# Patient Record
Sex: Female | Born: 1960
Health system: Southern US, Community
[De-identification: ages and names within clinical notes are randomized; demographics above are authoritative.]

## PROBLEM LIST (undated history)

## (undated) ENCOUNTER — Ambulatory Visit: Admission: EM | Payer: Self-pay | Source: Home / Self Care

## (undated) DIAGNOSIS — K5900 Constipation, unspecified: Secondary | ICD-10-CM

## (undated) DIAGNOSIS — E663 Overweight: Secondary | ICD-10-CM

## (undated) DIAGNOSIS — S83249A Other tear of medial meniscus, current injury, unspecified knee, initial encounter: Secondary | ICD-10-CM

## (undated) DIAGNOSIS — Z889 Allergy status to unspecified drugs, medicaments and biological substances status: Secondary | ICD-10-CM

## (undated) DIAGNOSIS — J45909 Unspecified asthma, uncomplicated: Secondary | ICD-10-CM

## (undated) DIAGNOSIS — R609 Edema, unspecified: Secondary | ICD-10-CM

## (undated) DIAGNOSIS — K635 Polyp of colon: Secondary | ICD-10-CM

## (undated) DIAGNOSIS — Q0701 Arnold-Chiari syndrome with spina bifida: Secondary | ICD-10-CM

## (undated) DIAGNOSIS — K5190 Ulcerative colitis, unspecified, without complications: Secondary | ICD-10-CM

## (undated) HISTORY — DX: Edema, unspecified: R60.9

## (undated) HISTORY — DX: Arnold-Chiari syndrome with spina bifida: Q07.01

## (undated) HISTORY — DX: Ulcerative colitis, unspecified, without complications: K51.90

## (undated) HISTORY — DX: Allergy status to unspecified drugs, medicaments and biological substances: Z88.9

## (undated) HISTORY — DX: Polyp of colon: K63.5

## (undated) HISTORY — DX: Overweight: E66.3

## (undated) HISTORY — DX: Constipation, unspecified: K59.00

## (undated) HISTORY — DX: Other tear of medial meniscus, current injury, unspecified knee, initial encounter: S83.249A

---

## 1997-07-10 HISTORY — PX: ABDOMINAL HYSTERECTOMY: SHX81

## 1998-02-17 ENCOUNTER — Other Ambulatory Visit: Admission: RE | Admit: 1998-02-17 | Discharge: 1998-02-17 | Payer: Self-pay | Admitting: Obstetrics and Gynecology

## 1998-04-14 ENCOUNTER — Inpatient Hospital Stay (HOSPITAL_COMMUNITY): Admission: RE | Admit: 1998-04-14 | Discharge: 1998-04-16 | Payer: Self-pay | Admitting: Obstetrics and Gynecology

## 1998-04-18 ENCOUNTER — Inpatient Hospital Stay (HOSPITAL_COMMUNITY): Admission: AD | Admit: 1998-04-18 | Discharge: 1998-04-18 | Payer: Self-pay | Admitting: Obstetrics & Gynecology

## 1999-02-21 ENCOUNTER — Other Ambulatory Visit: Admission: RE | Admit: 1999-02-21 | Discharge: 1999-02-21 | Payer: Self-pay | Admitting: Obstetrics and Gynecology

## 1999-08-31 ENCOUNTER — Emergency Department (HOSPITAL_COMMUNITY): Admission: EM | Admit: 1999-08-31 | Discharge: 1999-08-31 | Payer: Self-pay | Admitting: Emergency Medicine

## 1999-09-01 ENCOUNTER — Emergency Department (HOSPITAL_COMMUNITY): Admission: EM | Admit: 1999-09-01 | Discharge: 1999-09-01 | Payer: Self-pay

## 1999-09-09 ENCOUNTER — Emergency Department (HOSPITAL_COMMUNITY): Admission: EM | Admit: 1999-09-09 | Discharge: 1999-09-09 | Payer: Self-pay | Admitting: Emergency Medicine

## 2000-08-26 ENCOUNTER — Encounter: Payer: Self-pay | Admitting: Family Medicine

## 2000-08-26 ENCOUNTER — Ambulatory Visit (HOSPITAL_COMMUNITY): Admission: RE | Admit: 2000-08-26 | Discharge: 2000-08-26 | Payer: Self-pay | Admitting: Family Medicine

## 2001-03-22 ENCOUNTER — Other Ambulatory Visit: Admission: RE | Admit: 2001-03-22 | Discharge: 2001-03-22 | Payer: Self-pay | Admitting: Obstetrics and Gynecology

## 2002-05-02 ENCOUNTER — Other Ambulatory Visit: Admission: RE | Admit: 2002-05-02 | Discharge: 2002-05-02 | Payer: Self-pay | Admitting: Obstetrics and Gynecology

## 2002-07-10 HISTORY — PX: HERNIA REPAIR: SHX51

## 2003-03-25 ENCOUNTER — Emergency Department (HOSPITAL_COMMUNITY): Admission: EM | Admit: 2003-03-25 | Discharge: 2003-03-26 | Payer: Self-pay | Admitting: Emergency Medicine

## 2003-03-26 ENCOUNTER — Encounter: Payer: Self-pay | Admitting: Emergency Medicine

## 2003-05-05 ENCOUNTER — Other Ambulatory Visit: Admission: RE | Admit: 2003-05-05 | Discharge: 2003-05-05 | Payer: Self-pay | Admitting: Obstetrics and Gynecology

## 2004-01-26 ENCOUNTER — Ambulatory Visit (HOSPITAL_COMMUNITY): Admission: RE | Admit: 2004-01-26 | Discharge: 2004-01-26 | Payer: Self-pay | Admitting: Family Medicine

## 2004-01-29 ENCOUNTER — Encounter: Admission: RE | Admit: 2004-01-29 | Discharge: 2004-01-29 | Payer: Self-pay | Admitting: Family Medicine

## 2004-07-10 DIAGNOSIS — K635 Polyp of colon: Secondary | ICD-10-CM

## 2004-07-10 HISTORY — DX: Polyp of colon: K63.5

## 2004-10-07 ENCOUNTER — Other Ambulatory Visit: Admission: RE | Admit: 2004-10-07 | Discharge: 2004-10-07 | Payer: Self-pay | Admitting: Obstetrics and Gynecology

## 2004-10-11 ENCOUNTER — Encounter: Payer: Self-pay | Admitting: Family Medicine

## 2004-11-09 ENCOUNTER — Encounter: Admission: RE | Admit: 2004-11-09 | Discharge: 2004-11-09 | Payer: Self-pay | Admitting: Family Medicine

## 2005-05-18 ENCOUNTER — Encounter (INDEPENDENT_AMBULATORY_CARE_PROVIDER_SITE_OTHER): Payer: Self-pay | Admitting: Specialist

## 2005-05-18 ENCOUNTER — Encounter: Payer: Self-pay | Admitting: Family Medicine

## 2005-05-18 ENCOUNTER — Ambulatory Visit (HOSPITAL_COMMUNITY): Admission: RE | Admit: 2005-05-18 | Discharge: 2005-05-18 | Payer: Self-pay | Admitting: Gastroenterology

## 2005-10-13 ENCOUNTER — Encounter: Admission: RE | Admit: 2005-10-13 | Discharge: 2005-10-13 | Payer: Self-pay | Admitting: Obstetrics and Gynecology

## 2006-05-11 ENCOUNTER — Ambulatory Visit: Payer: Self-pay | Admitting: Family Medicine

## 2006-05-11 LAB — CONVERTED CEMR LAB
BUN: 10 mg/dL (ref 6–23)
Calcium: 9.3 mg/dL (ref 8.4–10.5)
Chloride: 105 meq/L (ref 96–112)
Creatinine, Ser: 0.8 mg/dL (ref 0.4–1.2)

## 2007-08-29 ENCOUNTER — Telehealth (INDEPENDENT_AMBULATORY_CARE_PROVIDER_SITE_OTHER): Payer: Self-pay | Admitting: *Deleted

## 2007-09-06 ENCOUNTER — Telehealth (INDEPENDENT_AMBULATORY_CARE_PROVIDER_SITE_OTHER): Payer: Self-pay | Admitting: *Deleted

## 2007-09-06 ENCOUNTER — Ambulatory Visit: Payer: Self-pay | Admitting: Family Medicine

## 2007-09-06 DIAGNOSIS — J019 Acute sinusitis, unspecified: Secondary | ICD-10-CM | POA: Insufficient documentation

## 2007-09-06 DIAGNOSIS — I10 Essential (primary) hypertension: Secondary | ICD-10-CM | POA: Insufficient documentation

## 2007-09-06 LAB — CONVERTED CEMR LAB
CO2: 34 meq/L — ABNORMAL HIGH (ref 19–32)
Chloride: 103 meq/L (ref 96–112)
Creatinine, Ser: 0.7 mg/dL (ref 0.4–1.2)
GFR calc Af Amer: 115 mL/min
Sodium: 142 meq/L (ref 135–145)

## 2007-09-13 ENCOUNTER — Encounter (INDEPENDENT_AMBULATORY_CARE_PROVIDER_SITE_OTHER): Payer: Self-pay | Admitting: *Deleted

## 2007-09-13 ENCOUNTER — Ambulatory Visit: Payer: Self-pay | Admitting: Family Medicine

## 2007-09-13 LAB — CONVERTED CEMR LAB
Triglycerides: 103 mg/dL (ref 0–149)
VLDL: 21 mg/dL (ref 0–40)

## 2007-09-16 ENCOUNTER — Telehealth (INDEPENDENT_AMBULATORY_CARE_PROVIDER_SITE_OTHER): Payer: Self-pay | Admitting: Family Medicine

## 2007-09-18 ENCOUNTER — Ambulatory Visit: Payer: Self-pay | Admitting: Family Medicine

## 2007-09-20 ENCOUNTER — Telehealth (INDEPENDENT_AMBULATORY_CARE_PROVIDER_SITE_OTHER): Payer: Self-pay | Admitting: *Deleted

## 2007-12-25 ENCOUNTER — Telehealth (INDEPENDENT_AMBULATORY_CARE_PROVIDER_SITE_OTHER): Payer: Self-pay | Admitting: *Deleted

## 2008-02-19 ENCOUNTER — Telehealth (INDEPENDENT_AMBULATORY_CARE_PROVIDER_SITE_OTHER): Payer: Self-pay | Admitting: *Deleted

## 2008-02-21 ENCOUNTER — Ambulatory Visit: Payer: Self-pay | Admitting: Family Medicine

## 2008-02-21 DIAGNOSIS — E876 Hypokalemia: Secondary | ICD-10-CM

## 2008-02-27 ENCOUNTER — Telehealth (INDEPENDENT_AMBULATORY_CARE_PROVIDER_SITE_OTHER): Payer: Self-pay | Admitting: *Deleted

## 2008-02-27 LAB — CONVERTED CEMR LAB
BUN: 19 mg/dL (ref 6–23)
CO2: 33 meq/L — ABNORMAL HIGH (ref 19–32)
Calcium: 9.2 mg/dL (ref 8.4–10.5)
Chloride: 101 meq/L (ref 96–112)
GFR calc Af Amer: 86 mL/min
Glucose, Bld: 72 mg/dL (ref 70–99)
Potassium: 3 meq/L — ABNORMAL LOW (ref 3.5–5.1)

## 2008-03-25 LAB — CONVERTED CEMR LAB: Pap Smear: NORMAL

## 2008-04-09 ENCOUNTER — Ambulatory Visit: Payer: Self-pay | Admitting: Family Medicine

## 2008-04-13 ENCOUNTER — Telehealth (INDEPENDENT_AMBULATORY_CARE_PROVIDER_SITE_OTHER): Payer: Self-pay | Admitting: *Deleted

## 2008-04-13 LAB — CONVERTED CEMR LAB
BUN: 12 mg/dL (ref 6–23)
CO2: 32 meq/L (ref 19–32)
Calcium: 9.1 mg/dL (ref 8.4–10.5)
Creatinine, Ser: 0.7 mg/dL (ref 0.4–1.2)
GFR calc Af Amer: 114 mL/min

## 2008-10-28 ENCOUNTER — Emergency Department (HOSPITAL_BASED_OUTPATIENT_CLINIC_OR_DEPARTMENT_OTHER): Admission: EM | Admit: 2008-10-28 | Discharge: 2008-10-28 | Payer: Self-pay | Admitting: Emergency Medicine

## 2008-11-26 ENCOUNTER — Ambulatory Visit: Payer: Self-pay | Admitting: Family Medicine

## 2008-11-26 LAB — CONVERTED CEMR LAB
Chloride: 101 meq/L (ref 96–112)
GFR calc non Af Amer: 80.73 mL/min (ref >60)
Glucose, Bld: 80 mg/dL (ref 70–99)
Sodium: 139 meq/L (ref 135–145)

## 2008-11-27 ENCOUNTER — Encounter (INDEPENDENT_AMBULATORY_CARE_PROVIDER_SITE_OTHER): Payer: Self-pay | Admitting: *Deleted

## 2009-06-14 ENCOUNTER — Ambulatory Visit: Payer: Self-pay | Admitting: Family Medicine

## 2009-06-14 DIAGNOSIS — D239 Other benign neoplasm of skin, unspecified: Secondary | ICD-10-CM | POA: Insufficient documentation

## 2009-06-14 DIAGNOSIS — J45909 Unspecified asthma, uncomplicated: Secondary | ICD-10-CM | POA: Insufficient documentation

## 2009-06-14 DIAGNOSIS — M25569 Pain in unspecified knee: Secondary | ICD-10-CM | POA: Insufficient documentation

## 2009-06-15 ENCOUNTER — Telehealth (INDEPENDENT_AMBULATORY_CARE_PROVIDER_SITE_OTHER): Payer: Self-pay | Admitting: *Deleted

## 2009-06-15 LAB — CONVERTED CEMR LAB
ALT: 37 units/L — ABNORMAL HIGH (ref 0–35)
Albumin: 3.8 g/dL (ref 3.5–5.2)
BUN: 14 mg/dL (ref 6–23)
Basophils Relative: 1.6 % (ref 0.0–3.0)
Bilirubin, Direct: 0.1 mg/dL (ref 0.0–0.3)
CO2: 32 meq/L (ref 19–32)
Calcium: 9.3 mg/dL (ref 8.4–10.5)
Chloride: 101 meq/L (ref 96–112)
Cholesterol: 152 mg/dL (ref 0–200)
Creatinine, Ser: 0.7 mg/dL (ref 0.4–1.2)
Eosinophils Relative: 3.9 % (ref 0.0–5.0)
Glucose, Bld: 102 mg/dL — ABNORMAL HIGH (ref 70–99)
HCT: 40.6 % (ref 36.0–46.0)
LDL Cholesterol: 89 mg/dL (ref 0–99)
Lymphocytes Relative: 28.9 % (ref 12.0–46.0)
MCHC: 34 g/dL (ref 30.0–36.0)
MCV: 94.7 fL (ref 78.0–100.0)
Monocytes Absolute: 0.5 10*3/uL (ref 0.1–1.0)
Neutro Abs: 4 10*3/uL (ref 1.4–7.7)
Neutrophils Relative %: 58.1 % (ref 43.0–77.0)
RBC: 4.29 M/uL (ref 3.87–5.11)
Total Bilirubin: 1.3 mg/dL — ABNORMAL HIGH (ref 0.3–1.2)
Total CHOL/HDL Ratio: 4
WBC: 6.9 10*3/uL (ref 4.5–10.5)

## 2010-01-12 ENCOUNTER — Encounter: Payer: Self-pay | Admitting: Family Medicine

## 2010-03-28 ENCOUNTER — Ambulatory Visit: Payer: Self-pay | Admitting: Family Medicine

## 2010-03-28 ENCOUNTER — Telehealth: Payer: Self-pay | Admitting: Family Medicine

## 2010-03-28 DIAGNOSIS — Z780 Asymptomatic menopausal state: Secondary | ICD-10-CM | POA: Insufficient documentation

## 2010-03-28 LAB — CONVERTED CEMR LAB
Blood in Urine, dipstick: NEGATIVE
Nitrite: NEGATIVE
Protein, U semiquant: NEGATIVE
Specific Gravity, Urine: >=1.03
Urobilinogen, UA: NEGATIVE

## 2010-03-29 LAB — CONVERTED CEMR LAB
ALT: 37 units/L — ABNORMAL HIGH (ref 0–35)
Alkaline Phosphatase: 84 units/L (ref 39–117)
BUN: 16 mg/dL (ref 6–23)
Basophils Absolute: 0.1 10*3/uL (ref 0.0–0.1)
Bilirubin, Direct: 0.2 mg/dL (ref 0.0–0.3)
CO2: 30 meq/L (ref 19–32)
Calcium: 9.3 mg/dL (ref 8.4–10.5)
Cholesterol: 155 mg/dL (ref 0–200)
Creatinine, Ser: 0.7 mg/dL (ref 0.4–1.2)
Eosinophils Relative: 3.1 % (ref 0.0–5.0)
Glucose, Bld: 103 mg/dL — ABNORMAL HIGH (ref 70–99)
HCT: 40.2 % (ref 36.0–46.0)
Hemoglobin: 13.6 g/dL (ref 12.0–15.0)
Lymphs Abs: 2.3 10*3/uL (ref 0.7–4.0)
Monocytes Absolute: 0.5 10*3/uL (ref 0.1–1.0)
Neutro Abs: 4.5 10*3/uL (ref 1.4–7.7)
Platelets: 240 10*3/uL (ref 150.0–400.0)
Potassium: 3.8 meq/L (ref 3.5–5.1)
Sodium: 145 meq/L (ref 135–145)
Total Bilirubin: 0.7 mg/dL (ref 0.3–1.2)
Total CHOL/HDL Ratio: 4
VLDL: 18 mg/dL (ref 0.0–40.0)
Vit D, 25-Hydroxy: 32 ng/mL (ref 30–89)
WBC: 7.6 10*3/uL (ref 4.5–10.5)

## 2010-08-09 NOTE — Procedures (Signed)
Summary: Colonoscopy/[place]  Colonoscopy/[facility]   Imported By: Sherian Rein 04/01/2010 14:59:03  _____________________________________________________________________  External Attachment:    Type:   Image     Comment:   External Document

## 2010-08-09 NOTE — Assessment & Plan Note (Signed)
Summary: CPX/FASTING//KN   Vital Signs:  Patient profile:   50 year old female Height:      63 inches Weight:      266.0 pounds BMI:     47.29 Temp:     98.4 degrees F oral Pulse rate:   80 / minute Pulse rhythm:   regular BP sitting:   120 / 86  (right arm)  Vitals Entered By: Almeta Monas CMA Duncan Dull) (March 28, 2010 9:10 AM) CC: cpx/fasting Flu Vaccine Consent Questions     Do you have a history of severe allergic reactions to this vaccine? no    Any prior history of allergic reactions to egg and/or gelatin? no    Do you have a sensitivity to the preservative Thimersol? no    Do you have a past history of Guillan-Barre Syndrome? no    Do you currently have an acute febrile illness? no    Have you ever had a severe reaction to latex? no    Vaccine information given and explained to patient? yes    Are you currently pregnant? no    Lot Number:[id number]   Exp Date:01/07/2011   Site Given  Left Deltoid IM   History of Present Illness: Pt here for cpe and labs.  Dr Henderson Cloud is her gyn.    Asthma History    Asthma Control Assessment:    Age range: 12+ years    Symptoms: 0-2 days/week    Nighttime Awakenings: 0-2/month    Interferes w/ normal activity: no limitations    SABA use (not for EIB): 0-2 days/week    ATAQ questionnaire: 0    Asthma Control Assessment: Well Controlled   Preventive Screening-Counseling & Management  Alcohol-Tobacco     Alcohol drinks/day: <1     Alcohol type: wine     Smoking Status: never  Caffeine-Diet-Exercise     Caffeine use/day: 1     Does Patient Exercise: yes     Type of exercise: zumba , treadmill, weights     Exercise (avg: min/session): 30-60     Times/week: 3     Exercise Counseling: not indicated; exercise is adequate  Hep-HIV-STD-Contraception     Dental Visit-last 6 months yes     Dental Care Counseling: not indicated; dental care within six months     SBE monthly: yes     SBE Education/Counseling: not indicated;  SBE done regularly  Safety-Violence-Falls     Seat Belt Use: yes     Seat Belt Counseling: to use seat belts when in vehicle      Sexual History:  currently monogamous.        Drug Use:  no.    Current Medications (verified): 1)  Hydrochlorothiazide 25 Mg  Tabs (Hydrochlorothiazide) .... Take One Tablet Daily 2)  Ambien 10 Mg  Tabs (Zolpidem Tartrate) .[name] Kitchen.. 1 By Mouth Qhs Prn 3)  Klor-Con M20 20 Meq Cr-Tabs (Potassium Chloride Crys Cr) .[name] Kitchen.. 1 By Mouth Two Times A Day 4)  Ultram 50 Mg Tabs (Tramadol Hcl) .[name] Kitchen.. 1-2 By Mouth Q6h As Needed 5)  Nasacort Aq 55 Mcg/act Aers (Triamcinolone Acetonide(Nasal)) .... 2 Sprays Each Nostril Once Daily 6)  Symbicort 80-4.5 Mcg/act Aero (Budesonide-Formoterol Fumarate) .... 2 Puffs Two Times A Day  Allergies (verified): No Known Drug Allergies  Past History:  Past Medical History: Last updated: 09/06/2007 Hypertension  Family History: Last updated: 03/28/2010 Family History of CAD Female 1st degree relative 52yo Family History of Stroke F 1st degree relative 52yo Family History  Hypertension Family History High cholesterol Family History Osteoporosis Family History of Colon CA 1st degree relative 52yo Family History of Prostate CA 1st degree relative 52yo Family History of Asthma  Social History: Last updated: 03/28/2010 GYN: for preventative care Occupation:  self employed-- pest control Married Never Smoked Alcohol use-yes Drug use-no Regular exercise-yes  Risk Factors: Alcohol Use: <1 (03/28/2010) Caffeine Use: 1 (03/28/2010) Exercise: yes (03/28/2010)  Risk Factors: Smoking Status: never (03/28/2010)  Family History: Reviewed history and no changes required. Family History of CAD Female 1st degree relative 52yo Family History of Stroke F 1st degree relative 52yo Family History Hypertension Family History High cholesterol Family History Osteoporosis Family History of Colon CA 1st degree relative 52yo Family History of  Prostate CA 1st degree relative 52yo Family History of Asthma  Social History: Reviewed history from 09/06/2007 and no changes required. GYN: for preventative care Occupation:  self employed-- pest control Married Never Smoked Alcohol use-yes Drug use-no Regular exercise-yes Smoking Status:  never Caffeine use/day:  1 Does Patient Exercise:  yes Dental Care w/in 6 mos.:  yes Seat Belt Use:  yes Sexual History:  currently monogamous Occupation:  employed Drug Use:  no  Review of Systems      See HPI General:  Denies chills, fatigue, fever, loss of appetite, malaise, sleep disorder, sweats, weakness, and weight loss. Eyes:  Denies blurring, discharge, double vision, eye irritation, eye pain, halos, itching, light sensitivity, red eye, vision loss-1 eye, and vision loss-both eyes; optho-q2y. ENT:  Denies decreased hearing, difficulty swallowing, ear discharge, earache, hoarseness, nasal congestion, nosebleeds, postnasal drainage, ringing in ears, sinus pressure, and sore throat. CV:  Denies bluish discoloration of lips or nails, chest pain or discomfort, difficulty breathing at night, difficulty breathing while lying down, fainting, fatigue, leg cramps with exertion, lightheadness, near fainting, palpitations, shortness of breath with exertion, swelling of feet, swelling of hands, and weight gain. Resp:  Denies chest discomfort, chest pain with inspiration, cough, coughing up blood, excessive snoring, hypersomnolence, morning headaches, pleuritic, shortness of breath, sputum productive, and wheezing. GI:  Denies abdominal pain, bloody stools, change in bowel habits, constipation, dark tarry stools, diarrhea, excessive appetite, gas, hemorrhoids, indigestion, loss of appetite, nausea, vomiting, vomiting blood, and yellowish skin color. GU:  Denies abnormal vaginal bleeding, decreased libido, discharge, dysuria, genital sores, hematuria, incontinence, nocturia, urinary frequency, and urinary  hesitancy. MS:  Denies joint pain, joint redness, joint swelling, loss of strength, low back pain, mid back pain, muscle aches, muscle , cramps, muscle weakness, stiffness, and thoracic pain. Derm:  Denies changes in color of skin, changes in nail beds, dryness, excessive perspiration, flushing, hair loss, insect bite(s), itching, lesion(s), poor wound healing, and rash. Neuro:  Denies brief paralysis, difficulty with concentration, disturbances in coordination, falling down, headaches, inability to speak, memory loss, numbness, poor balance, seizures, sensation of room spinning, tingling, tremors, visual disturbances, and weakness. Psych:  Denies alternate hallucination ( auditory/visual), anxiety, depression, easily angered, easily tearful, irritability, mental problems, panic attacks, sense of great danger, suicidal thoughts/plans, thoughts of violence, unusual visions or sounds, and thoughts /plans of harming others. Endo:  Denies cold intolerance, excessive hunger, excessive thirst, excessive urination, heat intolerance, polyuria, and weight change. Heme:  Denies abnormal bruising, bleeding, enlarge lymph nodes, fevers, pallor, and skin discoloration. Allergy:  Denies hives or rash, itching eyes, persistent infections, seasonal allergies, and sneezing.  Physical Exam  General:  Well-developed,well-nourished,in no acute distress; alert,appropriate and cooperative throughout examination Head:  Normocephalic and atraumatic without obvious abnormalities. No  apparent alopecia or balding. Eyes:  pupils equal, pupils round, pupils reactive to light, and no injection.   Ears:  External ear exam shows no significant lesions or deformities.  Otoscopic examination reveals clear canals, tympanic membranes are intact bilaterally without bulging, retraction, inflammation or discharge. Hearing is grossly normal bilaterally. Nose:  External nasal examination shows no deformity or inflammation. Nasal mucosa are  pink and moist without lesions or exudates. Mouth:  Oral mucosa and oropharynx without lesions or exudates.  Teeth in good repair. Neck:  No deformities, masses, or tenderness noted.no carotid bruits.   Chest Wall:  No deformities, masses, or tenderness noted. Breasts:  gyn Lungs:  Normal respiratory effort, chest expands symmetrically. Lungs are clear to auscultation, no crackles or wheezes. Heart:  normal rate and no murmur.   Abdomen:  Bowel sounds positive,abdomen soft and non-tender without masses, organomegaly or hernias noted. Rectal:  gyn Genitalia:  gyn Msk:  normal ROM, no joint tenderness, no joint swelling, no joint warmth, no redness over joints, no joint deformities, no joint instability, and no crepitation.   Pulses:  R posterior tibial normal, R dorsalis pedis normal, R carotid normal, L posterior tibial normal, L dorsalis pedis normal, and L carotid normal.   Extremities:  No clubbing, cyanosis, edema, or deformity noted with normal full range of motion of all joints.   Neurologic:  No cranial nerve deficits noted. Station and gait are normal. Plantar reflexes are down-going bilaterally. DTRs are symmetrical throughout. Sensory, motor and coordinative functions appear intact. Skin:  Intact without suspicious lesions or rashes Cervical Nodes:  No lymphadenopathy noted Axillary Nodes:  No palpable lymphadenopathy Psych:  Cognition and judgment appear intact. Alert and cooperative with normal attention span and concentration. No apparent delusions, illusions, hallucinations   Impression & Recommendations:  Problem # 1:  PREVENTIVE HEALTH CARE (ICD-V70.0)  Orders: Venipuncture (13244) TLB-Lipid Panel (80061-LIPID) TLB-BMP (Basic Metabolic Panel-BMET) (80048-METABOL) TLB-CBC Platelet - w/Differential (85025-CBCD) TLB-Hepatic/Liver Function Pnl (80076-HEPATIC) TLB-TSH (Thyroid Stimulating Hormone) (84443-TSH) T-Vitamin D (25-Hydroxy) (01027-25366) Specimen Handling  (99000) EKG w/ Interpretation (93000)  Problem # 2:  POSTMENOPAUSAL STATUS (ICD-V49.81)  Orders: Venipuncture (44034) TLB-Lipid Panel (80061-LIPID) TLB-BMP (Basic Metabolic Panel-BMET) (80048-METABOL) TLB-CBC Platelet - w/Differential (85025-CBCD) TLB-Hepatic/Liver Function Pnl (80076-HEPATIC) TLB-TSH (Thyroid Stimulating Hormone) (84443-TSH) T-Vitamin D (25-Hydroxy) (74259-56387) Specimen Handling (56433) EKG w/ Interpretation (93000)  Problem # 3:  ASTHMA, CHILDHOOD (ICD-493.00)  Her updated medication list for this problem includes:    Symbicort 80-4.5 Mcg/act Aero (Budesonide-formoterol fumarate) .[name] Kitchen... 2 puffs two times a day  Problem # 4:  HYPOKALEMIA (ICD-276.8)  Orders: Venipuncture (29518) TLB-Lipid Panel (80061-LIPID) TLB-BMP (Basic Metabolic Panel-BMET) (80048-METABOL) TLB-CBC Platelet - w/Differential (85025-CBCD) TLB-Hepatic/Liver Function Pnl (80076-HEPATIC) TLB-TSH (Thyroid Stimulating Hormone) (84443-TSH) T-Vitamin D (25-Hydroxy) (84166-06301) Specimen Handling (60109) EKG w/ Interpretation (93000)  Problem # 5:  MORBID OBESITY (ICD-278.01)  Ht: 63 (03/28/2010)   Wt: 266.0 (03/28/2010)   BMI: 47.29 (03/28/2010)  Orders: EKG w/ Interpretation (93000)  Complete Medication List: 1)  Hydrochlorothiazide 25 Mg Tabs (Hydrochlorothiazide) .... Take one tablet daily 2)  Ambien 10 Mg Tabs (Zolpidem tartrate) .[name] Kitchen.. 1 by mouth qhs prn 3)  Klor-con M20 20 Meq Cr-tabs (Potassium chloride crys cr) .[name] Kitchen.. 1 by mouth two times a day 4)  Ultram 50 Mg Tabs (Tramadol hcl) .[name] Kitchen.. 1-2 by mouth q6h as needed 5)  Nasacort Aq 55 Mcg/act Aers (Triamcinolone acetonide(nasal)) .... 2 sprays each nostril once daily 6)  Symbicort 80-4.5 Mcg/act Aero (Budesonide-formoterol fumarate) .... 2 puffs two times a day  Other Orders: Admin 1st Vaccine (16109) Flu Vaccine 60yrs + [phone]) Tdap => 26yrs IM (09811) Admin of Any Addtl Vaccine (91478)   EKG  Procedure date:   03/28/2010  Findings:      Normal sinus rhythm with rate of:  76 bpm   Colonoscopy Result Date:  03/22/2005 Colonoscopy Result:  polyp Colonoscopy Next Due:  Not Indicated PAP Result Date:  03/17/2009 PAP Result:  normal PAP Next Due:  2 yr Mammogram Result Date:  01/19/2010 Mammogram Result:  normal Mammogram Next Due:  1 yr   Immunizations Administered:  Tetanus Vaccine:    Vaccine Type: Tdap    Site: right deltoid    Mfr: Merck    Dose: 0.5 ml    Route: IM    Given by: Almeta Monas CMA (AAMA)    Exp. Date: 04/28/2012    Lot #: [id number]    VIS given: 05/27/08 version given March 28, 2010.   Laboratory Results   Urine Tests   Date/Time Reported: March 28, 2010 10:47 AM   Routine Urinalysis   Color: yellow Appearance: Clear Glucose: negative   (Normal Range: Negative) Bilirubin: negative   (Normal Range: Negative) Ketone: negative   (Normal Range: Negative) Spec. Gravity: >=1.030   (Normal Range: 1.003-1.035) Blood: negative   (Normal Range: Negative) pH: 5.0   (Normal Range: 5.0-8.0) Protein: negative   (Normal Range: Negative) Urobilinogen: negative   (Normal Range: 0-1) Nitrite: negative   (Normal Range: Negative) Leukocyte Esterace: negative   (Normal Range: Negative)    Comments: Floydene Flock  March 28, 2010 10:48 AM

## 2010-08-09 NOTE — Progress Notes (Signed)
Summary: RX/colon test info  Phone Note Outgoing Call   Summary of Call: called pt tpo advise her Colonoscopy not due until 2016. Pt voiced understanding, Adv Rx will be waiting upfront.  Initial call taken by: Almeta Monas CMA Duncan Dull),  March 28, 2010 1:57 PM  Follow-up for Phone Call        adipex 37.5  1 by mouth once daily ----needs f/u ov 1 month Follow-up by: Loreen Freud DO,  March 28, 2010 2:14 PM    New/Updated Medications: ADIPEX-P 37.5 MG TABS (PHENTERMINE HCL) 1 by mouth once daily Prescriptions: ADIPEX-P 37.5 MG TABS (PHENTERMINE HCL) 1 by mouth once daily  #30 x 0   Entered and Authorized by:   Loreen Freud DO   Signed by:   Loreen Freud DO on 03/28/2010   Method used:   Print then Give to Patient   RxID:   [id number]

## 2010-10-19 LAB — RAPID STREP SCREEN (MED CTR MEBANE ONLY): Streptococcus, Group A Screen (Direct): NEGATIVE

## 2010-11-25 NOTE — Op Note (Signed)
NAME:  Audrey Duncan, Audrey Duncan           ACCOUNT NO.:  [id number]   MEDICAL RECORD NO.:  [id number]          PATIENT TYPE:  AMB   LOCATION:  ENDO                         FACILITY:  [facility]   PHYSICIAN:  John C. Madilyn Fireman, M.D.    DATE OF BIRTH:  April 13, 1961   DATE OF PROCEDURE:  05/18/2005  DATE OF DISCHARGE:                                 OPERATIVE REPORT   INDICATIONS FOR PROCEDURE:  Occasional rectal bleeding with previous  abdominal pain, alternating constipation and diarrhea in a 50 year old  patient with no prior colon screening.   PROCEDURE:  The patient was placed in the left lateral decubitus position  and placed on the pulse monitor with continuous low-flow oxygen delivered by  nasal cannula. She was sedated with to 60 mcg IV fentanyl and 6 milligrams  IV Versed. Olympus video colonoscope was inserted into the rectum and  advanced to the cecum, confirmed by transillumination of McBurney's point  and visualization of the ileocecal valve and appendiceal orifice. Prep was  excellent. The cecum appeared normal with no masses, polyps, diverticula or  other mucosal abnormalities. There are a few small scattered diverticula in  the ascending colon as well as the transverse colon and no other  abnormalities noted.  Within the descending and sigmoid colon, there were  several scattered diverticula as well. No polyps or other mucosal  abnormalities seen.  In the rectum there was a 6 mL sessile polyp at  approximately 4 cm from anal verge which was removed by snare.  The  remainder of the rectum appeared normal. Scope was then withdrawn and the  patient returned to the recovery room in stable condition. She tolerated the  procedure well. There were no immediate complications.   IMPRESSION:  1.  Rectal polyp.  2.  Left and right-sided diverticulosis.   PLAN:  Await histology to determine method and interval for future colon  screening.           ______________________________  Everardo All.  Madilyn Fireman, M.D.     J[facility]/MEDQ  D:  05/18/2005  T:  05/18/2005  Job:  [id number]   cc:   Leanne Chang, M.D.  Fax: [phone]

## 2011-01-02 ENCOUNTER — Other Ambulatory Visit: Payer: Self-pay | Admitting: Family Medicine

## 2011-01-25 ENCOUNTER — Other Ambulatory Visit: Payer: Self-pay | Admitting: Family Medicine

## 2011-01-25 NOTE — Telephone Encounter (Signed)
Last seen 03/28/2010 and filled 02/21/2008 appt scheduled for 03/31/11 please advise     KP

## 2011-01-26 MED ORDER — ZOLPIDEM TARTRATE 10 MG PO TABS: 10.0000 mg | ORAL_TABLET | Freq: Every evening | ORAL | Status: DC | PRN

## 2011-01-26 NOTE — Telephone Encounter (Signed)
Faxed.   KP 

## 2011-03-31 ENCOUNTER — Ambulatory Visit (INDEPENDENT_AMBULATORY_CARE_PROVIDER_SITE_OTHER): Payer: Self-pay | Admitting: Family Medicine

## 2011-03-31 ENCOUNTER — Encounter: Payer: Self-pay | Admitting: Family Medicine

## 2011-03-31 VITALS — BP 124/82 | HR 73 | Temp 98.0°F | Ht 65.0 in | Wt 251.0 lb

## 2011-03-31 DIAGNOSIS — Z0000 Encounter for general adult medical examination without abnormal findings: Secondary | ICD-10-CM

## 2011-03-31 LAB — HEPATIC FUNCTION PANEL
ALT: 28 U/L (ref 0–35)
Albumin: 4 g/dL (ref 3.5–5.2)
Total Bilirubin: 1.1 mg/dL (ref 0.3–1.2)

## 2011-03-31 LAB — POCT URINALYSIS DIPSTICK
Blood, UA: NEGATIVE
Nitrite, UA: NEGATIVE
Spec Grav, UA: 1.02
Urobilinogen, UA: 0.2
pH, UA: 6

## 2011-03-31 LAB — BASIC METABOLIC PANEL
CO2: 32 mEq/L (ref 19–32)
GFR: 107.34 mL/min (ref >60.00)
Potassium: 4.1 mEq/L (ref 3.5–5.1)

## 2011-03-31 LAB — LIPID PANEL
Cholesterol: 146 mg/dL (ref 0–200)
LDL Cholesterol: 80 mg/dL (ref 0–99)
Triglycerides: 115 mg/dL (ref 0.0–149.0)
VLDL: 23 mg/dL (ref 0.0–40.0)

## 2011-03-31 LAB — CBC WITH DIFFERENTIAL/PLATELET
Basophils Relative: 0.7 % (ref 0.0–3.0)
HCT: 41.1 % (ref 36.0–46.0)
Hemoglobin: 13.7 g/dL (ref 12.0–15.0)
Lymphocytes Relative: 28.6 % (ref 12.0–46.0)
Lymphs Abs: 1.9 10*3/uL (ref 0.7–4.0)
MCV: 94 fl (ref 78.0–100.0)
Monocytes Absolute: 0.6 10*3/uL (ref 0.1–1.0)
Monocytes Relative: 8.3 % (ref 3.0–12.0)
RBC: 4.37 Mil/uL (ref 3.87–5.11)
RDW: 12.3 % (ref 11.5–14.6)

## 2011-03-31 LAB — TSH: TSH: 1.16 u[IU]/mL (ref 0.35–5.50)

## 2011-03-31 MED ORDER — HYDROCHLOROTHIAZIDE 25 MG PO TABS: ORAL_TABLET | ORAL | Status: DC

## 2011-03-31 NOTE — Progress Notes (Signed)
Subjective:     Audrey Duncan is a 50 y.o. female and is here for a comprehensive physical exam. The patient reports no problems.  History   Social History  . Marital Status: Married    Spouse Name: N/A    Number of Children: N/A  . Years of Education: N/A   Occupational History  . self employed--pest control    Social History Main Topics  . Smoking status: Never Smoker   . Smokeless tobacco: Never Used  . Alcohol Use: Yes     1 glass of wine 1-2 x a month  . Drug Use: No  . Sexually Active: Yes -- Female partner(s)   Other Topics Concern  . Not on file   Social History Narrative  . No narrative on file   Health Maintenance  Topic Date Due  . Colonoscopy  01/02/2009  . Influenza Vaccine  04/10/2011  . Mammogram  05/30/2012  . Pap Smear  03/30/2013  . Tetanus/tdap  03/28/2020    The following portions of the patient's history were reviewed and updated as appropriate: allergies, current medications, past family history, past medical history, past social history, past surgical history and problem list.  Review of Systems Review of Systems  Constitutional: Negative for activity change, appetite change and fatigue.  HENT: Negative for hearing loss, congestion, tinnitus and ear discharge.  dentist q89m Eyes: Negative for visual disturbance (see optho q1y -- vision corrected to 20/20 with glasses).  Respiratory: Negative for cough, chest tightness and shortness of breath.   Cardiovascular: Negative for chest pain, palpitations and leg swelling.  Gastrointestinal: Negative for abdominal pain, diarrhea, constipation and abdominal distention.  Genitourinary: Negative for urgency, frequency, decreased urine volume and difficulty urinating.  Musculoskeletal: Negative for back pain, arthralgias and gait problem.  Skin: Negative for color change, pallor and rash.  Neurological: Negative for dizziness, light-headedness, numbness and headaches.  Hematological: Negative for  adenopathy. Does not bruise/bleed easily.  Psychiatric/Behavioral: Negative for suicidal ideas, confusion, sleep disturbance, self-injury, dysphoric mood, decreased concentration and agitation.       Objective:    BP 124/82  Pulse 73  Temp(Src) 98 F (36.7 C) (Oral)  Ht 5\' 5"  (1.651 m)  Wt 251 lb (113.853 kg)  BMI 41.77 kg/m2  SpO2 97%  General Appearance:    Alert, cooperative, no distress, appears stated age  Head:    Normocephalic, without obvious abnormality, atraumatic  Eyes:    PERRL, conjunctiva/corneas clear, EOM's intact, fundi    benign, both eyes  Ears:    Normal TM's and external ear canals, both ears  Nose:   Nares normal, septum midline, mucosa normal, no drainage    or sinus tenderness  Throat:   Lips, mucosa, and tongue normal; teeth and gums normal  Neck:   Supple, symmetrical, trachea midline, no adenopathy;    thyroid:  no enlargement/tenderness/nodules; no carotid   bruit or JVD  Back:     Symmetric, no curvature, ROM normal, no CVA tenderness  Lungs:     Clear to auscultation bilaterally, respirations unlabored  Chest Wall:    No tenderness or deformity   Heart:    Regular rate and rhythm, S1 and S2 normal, no murmur, rub   or gallop  Breast Exam:    No tenderness, masses, or nipple abnormality  Abdomen:     Soft, non-tender, bowel sounds active all four quadrants,    no masses, no organomegaly  Genitalia:  deferred  Rectal:    deferred  Extremities:   Extremities normal, atraumatic, no cyanosis or edema  Pulses:   2+ and symmetric all extremities  Skin:   Skin color, texture, turgor normal, no rashes or lesions  Lymph nodes:   Cervical, supraclavicular, and axillary nodes normal  Neurologic:   CNII-XII intact, normal strength, sensation and reflexes    throughout      Assessment:    Healthy female exam.       Plan:    ghm utd Check fasting labs See After Visit Summary for Counseling Recommendations

## 2011-03-31 NOTE — Patient Instructions (Signed)

## 2011-04-04 ENCOUNTER — Encounter: Payer: Self-pay | Admitting: *Deleted

## 2011-04-11 ENCOUNTER — Ambulatory Visit: Payer: Self-pay

## 2012-02-22 ENCOUNTER — Other Ambulatory Visit: Payer: Self-pay | Admitting: Family Medicine

## 2012-05-30 ENCOUNTER — Other Ambulatory Visit: Payer: Self-pay | Admitting: Family Medicine

## 2012-05-31 NOTE — Telephone Encounter (Signed)
OV 03/31/11 last filled 02/22/12 # 100 no refills   PLz advise  MW

## 2012-05-31 NOTE — Telephone Encounter (Signed)
Pt due for ov

## 2012-05-31 NOTE — Telephone Encounter (Signed)
Letter mailed     KP 

## 2012-09-19 ENCOUNTER — Other Ambulatory Visit: Payer: Self-pay | Admitting: Family Medicine

## 2012-10-28 ENCOUNTER — Ambulatory Visit (INDEPENDENT_AMBULATORY_CARE_PROVIDER_SITE_OTHER): Payer: Self-pay | Admitting: Family Medicine

## 2012-10-28 ENCOUNTER — Encounter: Payer: Self-pay | Admitting: Family Medicine

## 2012-10-28 VITALS — BP 112/74 | HR 81 | Temp 98.8°F | Wt 264.0 lb

## 2012-10-28 DIAGNOSIS — K625 Hemorrhage of anus and rectum: Secondary | ICD-10-CM

## 2012-10-28 NOTE — Progress Notes (Deleted)
  Subjective:    Patient ID: Audrey Duncan, female    DOB: 30-Dec-1960, 52 y.o.   MRN: [id number]  HPI    Review of Systems     Objective:   Physical Exam        Assessment & Plan:

## 2012-10-28 NOTE — Progress Notes (Addendum)
  Subjective:    Audrey Duncan is a 52 y.o. female here for evaluation of blood in stool. Patient has associated symptoms of abdominal pain, diarrhea and visible blood: heavy. The patient denies alternating loose stools and constipation. The patient has a known history of: colitis as a child and recent hx norovirus. The patient has had 2 episodes of rectal bleeding. There is not a history of rectal injury. Patient has had similar episodes of rectal bleeding in the past.-----distant past.  She was a "kid" she said.    The following portions of the patient's history were reviewed and updated as appropriate: allergies, current medications, past family history, past medical history, past social history, past surgical history and problem list.  Review of Systems Pertinent items are noted in HPI.    Objective:    BP 112/74  Pulse 81  Temp(Src) 98.8 F (37.1 C) (Oral)  Wt 264 lb (119.75 kg)  BMI 43.93 kg/m2  SpO2 97% General appearance: alert, cooperative, appears stated age and no distress Abdomen: soft , min tenderness ,   + BS no guarding  Rectal--heme + brown stool      Assessment:    Rectal bleeding,   Plan:    1. Schedule for CT scan of abdomen and pelvis. 2. Blood work per orders. 3. Consider GI vs surgery. Follow up as needed.

## 2012-10-28 NOTE — Addendum Note (Signed)
Addended by: Lelon Perla on: 10/28/2012 05:32 PM   Modules accepted: Level of Service

## 2012-10-28 NOTE — Patient Instructions (Signed)
Rectal Bleeding Rectal bleeding is when blood passes out of the anus. It is usually a sign that something is wrong. It may not be serious, but it should always be evaluated. Rectal bleeding may present as bright red blood or extremely dark stools. The color may range from dark red or maroon to black (like tar). It is important that the cause of rectal bleeding be identified so treatment can be started and the problem corrected. CAUSES   Hemorrhoids. These are enlarged (dilated) blood vessels or veins in the anal or rectal area.  Fistulas. Theseare abnormal, burrowing channels that usually run from inside the rectum to the skin around the anus. They can bleed.  Anal fissures. This is a tear in the tissue of the anus. Bleeding occurs with bowel movements.  Diverticulosis. This is a condition in which pockets or sacs project from the bowel wall. Occasionally, the sacs can bleed.  Diverticulitis. Thisis an infection involving diverticulosis of the colon.  Proctitis and colitis. These are conditions in which the rectum, colon, or both, can become inflamed and pitted (ulcerated).  Polyps and cancer. Polyps are non-cancerous (benign) growths in the colon that may bleed. Certain types of polyps turn into cancer.  Protrusion of the rectum. Part of the rectum can project from the anus and bleed.  Certain medicines.  Intestinal infections.  Blood vessel abnormalities. HOME CARE INSTRUCTIONS  Eat a high-fiber diet to keep your stool soft.  Limit activity.  Drink enough fluids to keep your urine clear or pale yellow.  Warm baths may be useful to soothe rectal pain.  Follow up with your caregiver as directed. SEEK IMMEDIATE MEDICAL CARE IF:  You develop increased bleeding.  You have black or dark red stools.  You vomit blood or material that looks like coffee grounds.  You have abdominal pain or tenderness.  You have a fever.  You feel weak, nauseous, or you faint.  You have  severe rectal pain or you are unable to have a bowel movement. MAKE SURE YOU:  Understand these instructions.  Will watch your condition.  Will get help right away if you are not doing well or get worse. Document Released: 12/16/2001 Document Revised: 09/18/2011 Document Reviewed: 12/11/2010 [employer] Patient Information 2013 [employer].  

## 2012-10-29 ENCOUNTER — Other Ambulatory Visit: Payer: Self-pay | Admitting: Family Medicine

## 2012-10-29 ENCOUNTER — Telehealth: Payer: Self-pay

## 2012-10-29 ENCOUNTER — Encounter (HOSPITAL_BASED_OUTPATIENT_CLINIC_OR_DEPARTMENT_OTHER): Payer: Self-pay

## 2012-10-29 ENCOUNTER — Ambulatory Visit (HOSPITAL_BASED_OUTPATIENT_CLINIC_OR_DEPARTMENT_OTHER)
Admission: RE | Admit: 2012-10-29 | Discharge: 2012-10-29 | Disposition: A | Discharge status: Home / Self Care | Payer: Self-pay | Source: Ambulatory Visit | Attending: Family Medicine | Admitting: Family Medicine

## 2012-10-29 ENCOUNTER — Encounter: Payer: Self-pay | Admitting: Gastroenterology

## 2012-10-29 DIAGNOSIS — K7689 Other specified diseases of liver: Secondary | ICD-10-CM | POA: Insufficient documentation

## 2012-10-29 DIAGNOSIS — R109 Unspecified abdominal pain: Secondary | ICD-10-CM | POA: Insufficient documentation

## 2012-10-29 DIAGNOSIS — K429 Umbilical hernia without obstruction or gangrene: Secondary | ICD-10-CM | POA: Insufficient documentation

## 2012-10-29 DIAGNOSIS — R911 Solitary pulmonary nodule: Secondary | ICD-10-CM | POA: Insufficient documentation

## 2012-10-29 DIAGNOSIS — K625 Hemorrhage of anus and rectum: Secondary | ICD-10-CM

## 2012-10-29 HISTORY — DX: Unspecified asthma, uncomplicated: J45.909

## 2012-10-29 LAB — CBC WITH DIFFERENTIAL/PLATELET
Basophils Relative: 1.7 % (ref 0.0–3.0)
Eosinophils Relative: 2.8 % (ref 0.0–5.0)
HCT: 41.1 % (ref 36.0–46.0)
MCV: 92.1 fl (ref 78.0–100.0)
Neutrophils Relative %: 63.2 % (ref 43.0–77.0)
Platelets: 300 10*3/uL (ref 150.0–400.0)
RDW: 12.3 % (ref 11.5–14.6)
WBC: 8.5 10*3/uL (ref 4.5–10.5)

## 2012-10-29 LAB — HEPATIC FUNCTION PANEL
ALT: 38 U/L — ABNORMAL HIGH (ref 0–35)
Bilirubin, Direct: 0.1 mg/dL (ref 0.0–0.3)
Total Bilirubin: 0.9 mg/dL (ref 0.3–1.2)
Total Protein: 7.1 g/dL (ref 6.0–8.3)

## 2012-10-29 LAB — BASIC METABOLIC PANEL
BUN: 17 mg/dL (ref 6–23)
Calcium: 9.1 mg/dL (ref 8.4–10.5)
Chloride: 101 mEq/L (ref 96–112)
Sodium: 140 mEq/L (ref 135–145)

## 2012-10-29 MED ORDER — IOHEXOL 300 MG/ML  SOLN
100.0000 mL | Freq: Once | INTRAMUSCULAR | Status: AC | PRN
  Administered 2012-10-29: 100 mL via INTRAVENOUS

## 2012-10-29 NOTE — Progress Notes (Deleted)
  Subjective:    Patient ID: Audrey Duncan, female    DOB: 06/10/1961, 52 y.o.   MRN: [id number]  HPI    Review of Systems     Objective:   Physical Exam        Assessment & Plan:   

## 2012-10-29 NOTE — Telephone Encounter (Signed)
Nodule on liver-- -may be benign per radiology Check pet scan Pt needs GI for rectal bleeding    Discussed with patient and she voiced understanding. Ref put in and PET scan is scheduled for Wednesday.      KP

## 2012-11-01 ENCOUNTER — Other Ambulatory Visit (HOSPITAL_COMMUNITY): Payer: Self-pay

## 2012-11-04 ENCOUNTER — Encounter: Payer: Self-pay | Admitting: General Practice

## 2012-11-06 ENCOUNTER — Encounter (HOSPITAL_COMMUNITY)
Admission: RE | Admit: 2012-11-06 | Discharge: 2012-11-06 | Disposition: A | Discharge status: Home / Self Care | Payer: Self-pay | Source: Ambulatory Visit | Attending: Family Medicine | Admitting: Family Medicine

## 2012-11-06 ENCOUNTER — Encounter: Payer: Self-pay | Admitting: *Deleted

## 2012-11-06 DIAGNOSIS — K7689 Other specified diseases of liver: Secondary | ICD-10-CM

## 2012-11-06 DIAGNOSIS — R911 Solitary pulmonary nodule: Secondary | ICD-10-CM | POA: Insufficient documentation

## 2012-11-06 DIAGNOSIS — J984 Other disorders of lung: Secondary | ICD-10-CM | POA: Insufficient documentation

## 2012-11-06 MED ORDER — FLUDEOXYGLUCOSE F - 18 (FDG) INJECTION
16.1000 | Freq: Once | INTRAVENOUS | Status: AC | PRN
  Administered 2012-11-06: 16.1 via INTRAVENOUS

## 2012-11-19 ENCOUNTER — Ambulatory Visit (INDEPENDENT_AMBULATORY_CARE_PROVIDER_SITE_OTHER): Payer: Self-pay | Admitting: Internal Medicine

## 2012-11-19 ENCOUNTER — Encounter: Payer: Self-pay | Admitting: Gastroenterology

## 2012-11-19 ENCOUNTER — Ambulatory Visit (INDEPENDENT_AMBULATORY_CARE_PROVIDER_SITE_OTHER): Payer: Self-pay | Admitting: Gastroenterology

## 2012-11-19 ENCOUNTER — Encounter: Payer: Self-pay | Admitting: Internal Medicine

## 2012-11-19 ENCOUNTER — Ambulatory Visit (INDEPENDENT_AMBULATORY_CARE_PROVIDER_SITE_OTHER)
Admission: RE | Admit: 2012-11-19 | Discharge: 2012-11-19 | Disposition: A | Discharge status: Home / Self Care | Payer: Self-pay | Source: Ambulatory Visit | Attending: Internal Medicine | Admitting: Internal Medicine

## 2012-11-19 VITALS — BP 122/88 | HR 81 | Ht 63.5 in | Wt 263.5 lb

## 2012-11-19 VITALS — BP 132/80 | HR 91 | Temp 98.1°F | Ht 64.0 in | Wt 263.0 lb

## 2012-11-19 DIAGNOSIS — K420 Umbilical hernia with obstruction, without gangrene: Secondary | ICD-10-CM

## 2012-11-19 DIAGNOSIS — R911 Solitary pulmonary nodule: Secondary | ICD-10-CM

## 2012-11-19 DIAGNOSIS — K625 Hemorrhage of anus and rectum: Secondary | ICD-10-CM

## 2012-11-19 DIAGNOSIS — R1032 Left lower quadrant pain: Secondary | ICD-10-CM

## 2012-11-19 DIAGNOSIS — K7689 Other specified diseases of liver: Secondary | ICD-10-CM

## 2012-11-19 DIAGNOSIS — Z86010 Personal history of colonic polyps: Secondary | ICD-10-CM

## 2012-11-19 DIAGNOSIS — K760 Fatty (change of) liver, not elsewhere classified: Secondary | ICD-10-CM

## 2012-11-19 MED ORDER — MOVIPREP 100 G PO SOLR: 1.0000 | Freq: Once | ORAL | Status: DC

## 2012-11-19 NOTE — Assessment & Plan Note (Signed)
-   similar area present on plain cxr 11/2004 p chest wall injury  Although more dense now,  The area of concern is plainly seen on cxr now and 8 years ago making it very unlikely it represents any kind of neoplasm.  I would normally rec no more than a CT in one year but the pt tells me "that's what Dr Edwyna Shell told my mother and she died of lung cancer" so it's reasonable to reassure her at 3 months with limited CT but if no growth then one year later limited CT is all that I would rec to declare this a benign lesion (? Related to chest injury or colitis or previous pna in pt with steroid dep asthma as child).  Discussed in detail all the  indications, usual  risks and alternatives  relative to the benefits with patient who agrees to proceed with limited CT in 3 months > placed in tickle file

## 2012-11-19 NOTE — Addendum Note (Signed)
Addended by: Ok Anis A on: 11/19/2012 04:44 PM   Modules accepted: Orders

## 2012-11-19 NOTE — Progress Notes (Signed)
  Subjective:    Patient ID: Audrey Duncan, female    DOB: May 19, 1961  MRN: [id number]  HPI  35 yowf never smoker but heavy passive exposure as child with ulcerative colitis and bad asthma steroid dep as child but eventually completely outgrew with no need for any rx since around 2009, then bleeding developed April 2014 > GI w/u showed RLL nodule > PET scan low probability Ca but pos fm hx ca so pt wanted a second opinion and was referred by Dr Jarold Motto for this purpose to pulmonary clinic 11/19/2012   11/19/2012 1st pulmonary eval cc incidental RLLlobular nodule with no asthma flares in more than 5 years or need for prednisone.    No   assoc chronic cough or cp or chest tightness, subjective wheeze overt sinus or hb symptoms. No unusual exp hx  Though was injured in 2006 on R side of chest with vague RLL density detected on cone films at that time  Sleeping ok without nocturnal  or early am exacerbation  of respiratory  c/o's or need for noct saba. Also denies any obvious fluctuation of symptoms with weather or environmental changes or other aggravating or alleviating factors except as outlined above    Review of Systems  Constitutional: Negative for fever, chills and unexpected weight change.  HENT: Negative for ear pain, nosebleeds, congestion, sore throat, rhinorrhea, sneezing, trouble swallowing, dental problem, voice change, postnasal drip and sinus pressure.   Eyes: Negative for visual disturbance.  Respiratory: Negative for cough, choking and shortness of breath.   Cardiovascular: Negative for chest pain and leg swelling.  Gastrointestinal: Positive for abdominal pain. Negative for vomiting and diarrhea.  Genitourinary: Negative for difficulty urinating.  Musculoskeletal: Negative for arthralgias.  Skin: Negative for rash.  Neurological: Negative for tremors, syncope and headaches.  Hematological: Does not bruise/bleed easily.       Objective:   Physical Exam Obese amb wf  nad Wt Readings from Last 3 Encounters:  11/19/12 263 lb (119.296 kg)  11/19/12 263 lb 8 oz (119.523 kg)  10/28/12 264 lb (119.75 kg)    Obese amb wf nad  HEENT: nl dentition, turbinates, and orophanx. Nl external ear canals without cough reflex   NECK :  without JVD/Nodes/TM/ nl carotid upstrokes bilaterally   LUNGS: no acc muscle use, clear to A and P bilaterally without cough on insp or exp maneuvers   CV:  RRR  no s3 or murmur or increase in P2, no edema   ABD:  soft and nontender with nl excursion in the supine position. No bruits or organomegaly, bowel sounds nl  MS:  warm without deformities, calf tenderness, cyanosis or clubbing  SKIN: warm and dry without lesions    NEURO:  alert, approp, no deficits    CXR  11/19/2012 :  1. Polygonal shaped nodule in the right lower lobe, measured above. This measures smaller than on the recent PET CT, though a direct comparison is difficult. It may have been vaguely present on a May, 2006 chest examination        Assessment & Plan:

## 2012-11-19 NOTE — Progress Notes (Signed)
History of Present Illness:  This is a very nice 52 year old Caucasian female accompanied by her husband and.  She is referred for evaluation of 2 months of lower abdominal pain with diarrhea and 1 episode of rather severe bloody diarrhea.3 weeks ago. She had removal of a benign colon polyp by Dr. Dorena Cookey 6 years ago.  Previously the patient has had umbilical hernia hernia surgery by Dr. Gerrit Friends with mesh implantation 5-6 years previously.  She complains of discomfort in her suprapubic area followed by some watery, usually nonbloody diarrhea with increased mucus in her stool..  Apparently as a child she underwent colonoscopy by Dr. Terrial Rhodes, and was allegedly told that she had ulcerative colitis.  Because of her complaints she recently had CT scan of the abdomen and pelvis which showed a right lung nodule, fatty liver, and recurrence of her umbilical hernia with a loop of sigmoid colon visualized in the hernia defect.  She continues with crampy abdominal pain, loose stools, but her hematochezia has not recurred.  She is concerned about her" liver lesion" which is really a right lung nodule.  Apparently her mother had a similar scenario iwith conservative followup, and developed lung cancer, and the patient is concerned that this will happen to her.  She is not a smoker and denies any cardiopulmonary symptoms.  She does relate that stress causes a lot of cramping and diarrhea with associated mucus in her stool.  There's been no anorexia, weight loss, fever, chills, or other systemic complaints.  She does occasionally have episodes of constipation associated with lower abdominal pressure.  The patient denies a specific food intolerances.  Her father had colon cancer at age 83.  The patient has a Chiari vascular malformation of the brain followed by neurology.  She also has a history of seasonal asthma.  Patient underwent hysterectomy in 1999.  She denies current cardiovascular or systemic complaints  otherwise. I have reviewed this patient's present history, medical and surgical past history, allergies and medications.     ROS:   All systems were reviewed and are negative unless otherwise stated in the HPI.    Physical Exam: Blood pressure 120/88, pulse 81 and regular and weight 263 with a BMI of 45.94. General well developed well nourished patient in no acute distress, appearing their stated age Eyes PERRLA, no icterus, fundoscopic exam per opthamologist Skin no lesions noted Neck supple, no adenopathy, no thyroid enlargement, no tenderness Chest clear to percussion and auscultation Heart no significant murmurs, gallops or rubs noted Abdomen no hepatosplenomegaly masses or tenderness, BS normal.  Rectal inspection normal no fissures, or fistulae noted.  No masses or tenderness on digital exam. Stool guaiac negative. Extremities no acute joint lesions, edema, phlebitis or evidence of cellulitis. Neurologic patient oriented x 3, cranial nerves intact, no focal neurologic deficits noted. Psychological mental status normal and normal affect.  Assessment and plan: Probable IBS, rule out inflammatory bowel disease.  Her lower abdominal pressure seems to be related to her umbilical hernia recurrence with CT scan showing herniation of her sigmoid colon into the abdominal wall defect.  This will probably require repeat surgical correction.  I reviewed her CT and PET scans with patient and her husband, and we have decided to refer her to pulmonary for possible lung biopsy.  She does have rather marked obesity and her liver transaminases are a few points over normal consistent with fatty liver.  There no signs of chronic liver disease on physical exam.  I am concerned  about a family history of colon cancer in her father and her previous diagnosis of ulcerative colitis.  We have scheduled her for diagnostic colonoscopy, pulmonary consultation, and I will send a copy this note to Dr. Gerrit Friends At [facility].    CC I care and pulmonary medicine and DR. Bernardo Heater    Encounter Diagnoses  Name Primary?  . Solitary pulmonary nodule Yes  . Rectal bleeding   . Ulcerative colitis, unspecified   . Benign neoplasm of colon

## 2012-11-19 NOTE — Patient Instructions (Addendum)
  You have been scheduled for a colonoscopy with propofol. Please follow written instructions given to you at your visit today.  Please pick up your prep kit at the pharmacy within the next 1-3 days. If you use inhalers (even only as needed), please bring them with you on the day of your procedure. Your physician has requested that you go to www.startemmi.com and enter the access code given to you at your visit today. This web site gives a general overview about your procedure. However, you should still follow specific instructions given to you by our office regarding your preparation for the procedure.  You were scheduled with Pulmonary(2nd Floor) for today at 2 pm. _________________________________________________________________________                                               We are excited to introduce MyChart, a new best-in-class service that provides you online access to important information in your electronic medical record. We want to make it easier for you to view your health information - all in one secure location - when and where you need it. We expect MyChart will enhance the quality of care and service we provide.  When you register for MyChart, you can:    View your test results.    Request appointments and receive appointment reminders via email.    Request medication renewals.    View your medical history, allergies, medications and immunizations.    Communicate with your physician's office through a password-protected site.    Conveniently print information such as your medication lists.  To find out if MyChart is right for you, please talk to a member of our clinical staff today. We will gladly answer your questions about this free health and wellness tool.  If you are age 54 or older and want a member of your family to have access to your record, you must provide written consent by completing a proxy form available at our office. Please speak to our clinical  staff about guidelines regarding accounts for patients younger than age 30.  As you activate your MyChart account and need any technical assistance, please call the MyChart technical support line at (336) 83-CHART [phone]) or email your question to [email or web address]. If you email your question(s), please include your name, a return phone number and the best time to reach you.  If you have non-urgent health-related questions, you can send a message to our office through MyChart at [email or web address]. If you have a medical emergency, call 911.  Thank you for using MyChart as your new health and wellness resource!   MyChart licensed from [employer],  [id number]. Patents Pending.

## 2012-11-19 NOTE — Patient Instructions (Signed)
Please remember to go to  x-ray department downstairs for your tests - we will call you with the results when they are available.

## 2012-11-25 ENCOUNTER — Telehealth: Payer: Self-pay | Admitting: Internal Medicine

## 2012-11-25 DIAGNOSIS — R911 Solitary pulmonary nodule: Secondary | ICD-10-CM

## 2012-11-25 NOTE — Telephone Encounter (Signed)
Notes Recorded by Christen Butter, CMA on 11/20/2012 at 1:20 PM LMTCB ------  Notes Recorded by Nyoka Cowden, MD on 11/19/2012 at 5:09 PM Call pt: Reviewed cxr and the area is exactly same as in 2006 but more promient now so most likely scar as present for 8 years with little change.  To play it completely safe, would rec f/u ct limited to the LLL in 3 months then maybe repeat in one year if no change then. Pt  Advised and CT ordered. Carron Curie, CMA

## 2012-11-26 ENCOUNTER — Encounter: Payer: Self-pay | Admitting: Family Medicine

## 2012-12-05 ENCOUNTER — Encounter: Payer: Self-pay | Admitting: Gastroenterology

## 2012-12-09 ENCOUNTER — Encounter: Payer: Self-pay | Admitting: Gastroenterology

## 2012-12-23 ENCOUNTER — Other Ambulatory Visit: Payer: Self-pay | Admitting: Family Medicine

## 2013-01-06 ENCOUNTER — Encounter: Payer: Self-pay | Admitting: Family Medicine

## 2013-01-06 ENCOUNTER — Ambulatory Visit (INDEPENDENT_AMBULATORY_CARE_PROVIDER_SITE_OTHER): Payer: Self-pay | Admitting: Family Medicine

## 2013-01-06 VITALS — BP 122/70 | HR 51 | Temp 98.6°F | Ht 63.5 in | Wt 259.0 lb

## 2013-01-06 DIAGNOSIS — I10 Essential (primary) hypertension: Secondary | ICD-10-CM

## 2013-01-06 DIAGNOSIS — Z0000 Encounter for general adult medical examination without abnormal findings: Secondary | ICD-10-CM

## 2013-01-06 NOTE — Progress Notes (Signed)
Subjective:     Audrey Duncan is a 52 y.o. female and is here for a comprehensive physical exam. The patient reports no problems.  History   Social History  . Marital Status: Married    Spouse Name: N/A    Number of Children: N/A  . Years of Education: N/A   Occupational History  . self employed--pest control    Social History Main Topics  . Smoking status: Never Smoker   . Smokeless tobacco: Never Used  . Alcohol Use: Yes     Comment: 1 glass of wine 1-2 x a month  . Drug Use: No  . Sexually Active: Yes -- Female partner(s)   Other Topics Concern  . Not on file   Social History Narrative  . No narrative on file   Health Maintenance  Topic Date Due  . Mammogram  05/30/2012  . Influenza Vaccine  03/10/2013  . Pap Smear  03/30/2013  . Colonoscopy  05/19/2015  . Tetanus/tdap  03/28/2020    The following portions of the patient's history were reviewed and updated as appropriate:  She  has a past medical history of Asthma; Colon polyp (2006); Chiari malformation type II; and Ulcerative colitis. She  does not have any pertinent problems on file. She  has past surgical history that includes Hernia repair (2004); Abdominal hysterectomy (1999); and Cesarean section (1995 and 1998). Her family history includes Alcohol abuse in her brother and father; Asthma in her sister; COPD in her father; Cancer in her father and mother; Diabetes in her paternal uncle; Heart disease (age of onset: 54) in her mother; Hypertension in her father and mother; Lung cancer in her mother; and Stroke (age of onset: 12) in her mother. She  reports that she has never smoked. She has never used smokeless tobacco. She reports that  drinks alcohol. She reports that she does not use illicit drugs. She has a current medication list which includes the following prescription(s): hydrochlorothiazide, moviprep, and multiple vitamins-minerals. Current Outpatient Prescriptions on File Prior to Visit   Medication Sig Dispense Refill  . hydrochlorothiazide (HYDRODIURIL) 25 MG tablet TAKE 1 TABLET BY MOUTH EVERY DAY  100 tablet  1  . MOVIPREP 100 G SOLR Take 1 kit (100 g total) by mouth once.  1 kit  0  . Multiple Vitamins-Minerals (CENTRUM SILVER PO) Take by mouth every morning.       No current facility-administered medications on file prior to visit.   She has No Known Allergies..  Review of Systems Review of Systems  Constitutional: Negative for activity change, appetite change and fatigue.  HENT: Negative for hearing loss, congestion, tinnitus and ear discharge.  dentist q4m Eyes: Negative for visual disturbance (see optho q1y -- vision corrected to 20/20 with glasses).  Respiratory: Negative for cough, chest tightness and shortness of breath.   Cardiovascular: Negative for chest pain, palpitations and leg swelling.  Gastrointestinal: Negative for abdominal pain, diarrhea, constipation and abdominal distention.  Genitourinary: Negative for urgency, frequency, decreased urine volume and difficulty urinating.  Musculoskeletal: Negative for back pain, arthralgias and gait problem.  Skin: Negative for color change, pallor and rash.  Neurological: Negative for dizziness, light-headedness, numbness and headaches.  Hematological: Negative for adenopathy. Does not bruise/bleed easily.  Psychiatric/Behavioral: Negative for suicidal ideas, confusion, sleep disturbance, self-injury, dysphoric mood, decreased concentration and agitation.       Objective:    BP 122/70  Pulse 51  Temp(Src) 98.6 F (37 C) (Oral)  Ht 5' 3.5" (1.613  m)  Wt 259 lb (117.482 kg)  BMI 45.15 kg/m2  SpO2 96% General appearance: alert, cooperative, appears stated age and no distress Head: Normocephalic, without obvious abnormality, atraumatic Eyes: conjunctivae/corneas clear. PERRL, EOM's intact. Fundi benign. Ears: normal TM's and external ear canals both ears Nose: Nares normal. Septum midline. Mucosa  normal. No drainage or sinus tenderness. Throat: lips, mucosa, and tongue normal; teeth and gums normal Neck: no adenopathy, no carotid bruit, no JVD, supple, symmetrical, trachea midline and thyroid not enlarged, symmetric, no tenderness/mass/nodules Back: symmetric, no curvature. ROM normal. No CVA tenderness. Lungs: clear to auscultation bilaterally Breasts: gyn Heart: regular rate and rhythm, S1, S2 normal, no murmur, click, rub or gallop Abdomen: soft, non-tender; bowel sounds normal; no masses,  no organomegaly Pelvic: gyn Extremities: extremities normal, atraumatic, no cyanosis or edema Pulses: 2+ and symmetric Skin: Skin color, texture, turgor normal. No rashes or lesions Lymph nodes: Cervical, supraclavicular, and axillary nodes normal. Neurologic: Alert and oriented X 3, normal strength and tone. Normal symmetric reflexes. Normal coordination and gait Psych-  No depression, no anxiety      Assessment:    Healthy female exam.     Plan:    ghm utd  Check labs See After Visit Summary for Counseling Recommendations

## 2013-01-06 NOTE — Patient Instructions (Addendum)
Preventive Care for Adults, Female A healthy lifestyle and preventive care can promote health and wellness. Preventive health guidelines for women include the following key practices.  A routine yearly physical is a good way to check with your caregiver about your health and preventive screening. It is a chance to share any concerns and updates on your health, and to receive a thorough exam.  Visit your dentist for a routine exam and preventive care every 6 months. Brush your teeth twice a day and floss once a day. Good oral hygiene prevents tooth decay and gum disease.  The frequency of eye exams is based on your age, health, family medical history, use of contact lenses, and other factors. Follow your caregiver's recommendations for frequency of eye exams.  Eat a healthy diet. Foods like vegetables, fruits, whole grains, low-fat dairy products, and lean protein foods contain the nutrients you need without too many calories. Decrease your intake of foods high in solid fats, added sugars, and salt. Eat the right amount of calories for you.Get information about a proper diet from your caregiver, if necessary.  Regular physical exercise is one of the most important things you can do for your health. Most adults should get at least 150 minutes of moderate-intensity exercise (any activity that increases your heart rate and causes you to sweat) each week. In addition, most adults need muscle-strengthening exercises on 2 or more days a week.  Maintain a healthy weight. The body mass index (BMI) is a screening tool to identify possible weight problems. It provides an estimate of body fat based on height and weight. Your caregiver can help determine your BMI, and can help you achieve or maintain a healthy weight.For adults 20 years and older:  A BMI below 18.5 is considered underweight.  A BMI of 18.5 to 24.9 is normal.  A BMI of 25 to 29.9 is considered overweight.  A BMI of 30 and above is  considered obese.  Maintain normal blood lipids and cholesterol levels by exercising and minimizing your intake of saturated fat. Eat a balanced diet with plenty of fruit and vegetables. Blood tests for lipids and cholesterol should begin at age 20 and be repeated every 5 years. If your lipid or cholesterol levels are high, you are over 50, or you are at high risk for heart disease, you may need your cholesterol levels checked more frequently.Ongoing high lipid and cholesterol levels should be treated with medicines if diet and exercise are not effective.  If you smoke, find out from your caregiver how to quit. If you do not use tobacco, do not start.  If you are pregnant, do not drink alcohol. If you are breastfeeding, be very cautious about drinking alcohol. If you are not pregnant and choose to drink alcohol, do not exceed 1 drink per day. One drink is considered to be 12 ounces (355 mL) of beer, 5 ounces (148 mL) of wine, or 1.5 ounces (44 mL) of liquor.  Avoid use of street drugs. Do not share needles with anyone. Ask for help if you need support or instructions about stopping the use of drugs.  High blood pressure causes heart disease and increases the risk of stroke. Your blood pressure should be checked at least every 1 to 2 years. Ongoing high blood pressure should be treated with medicines if weight loss and exercise are not effective.  If you are 55 to 54 years old, ask your caregiver if you should take aspirin to prevent strokes.  Diabetes   screening involves taking a blood sample to check your fasting blood sugar level. This should be done once every 3 years, after age 45, if you are within normal weight and without risk factors for diabetes. Testing should be considered at a younger age or be carried out more frequently if you are overweight and have at least 1 risk factor for diabetes.  Breast cancer screening is essential preventive care for women. You should practice "breast  self-awareness." This means understanding the normal appearance and feel of your breasts and may include breast self-examination. Any changes detected, no matter how small, should be reported to a caregiver. Women in their 20s and 30s should have a clinical breast exam (CBE) by a caregiver as part of a regular health exam every 1 to 3 years. After age 40, women should have a CBE every year. Starting at age 40, women should consider having a mammography (breast X-ray test) every year. Women who have a family history of breast cancer should talk to their caregiver about genetic screening. Women at a high risk of breast cancer should talk to their caregivers about having magnetic resonance imaging (MRI) and a mammography every year.  The Pap test is a screening test for cervical cancer. A Pap test can show cell changes on the cervix that might become cervical cancer if left untreated. A Pap test is a procedure in which cells are obtained and examined from the lower end of the uterus (cervix).  Women should have a Pap test starting at age 21.  Between ages 21 and 29, Pap tests should be repeated every 2 years.  Beginning at age 30, you should have a Pap test every 3 years as long as the past 3 Pap tests have been normal.  Some women have medical problems that increase the chance of getting cervical cancer. Talk to your caregiver about these problems. It is especially important to talk to your caregiver if a new problem develops soon after your last Pap test. In these cases, your caregiver may recommend more frequent screening and Pap tests.  The above recommendations are the same for women who have or have not gotten the vaccine for human papillomavirus (HPV).  If you had a hysterectomy for a problem that was not cancer or a condition that could lead to cancer, then you no longer need Pap tests. Even if you no longer need a Pap test, a regular exam is a good idea to make sure no other problems are  starting.  If you are between ages 65 and 70, and you have had normal Pap tests going back 10 years, you no longer need Pap tests. Even if you no longer need a Pap test, a regular exam is a good idea to make sure no other problems are starting.  If you have had past treatment for cervical cancer or a condition that could lead to cancer, you need Pap tests and screening for cancer for at least 20 years after your treatment.  If Pap tests have been discontinued, risk factors (such as a new sexual partner) need to be reassessed to determine if screening should be resumed.  The HPV test is an additional test that may be used for cervical cancer screening. The HPV test looks for the virus that can cause the cell changes on the cervix. The cells collected during the Pap test can be tested for HPV. The HPV test could be used to screen women aged 30 years and older, and should   be used in women of any age who have unclear Pap test results. After the age of 30, women should have HPV testing at the same frequency as a Pap test.  Colorectal cancer can be detected and often prevented. Most routine colorectal cancer screening begins at the age of 50 and continues through age 75. However, your caregiver may recommend screening at an earlier age if you have risk factors for colon cancer. On a yearly basis, your caregiver may provide home test kits to check for hidden blood in the stool. Use of a small camera at the end of a tube, to directly examine the colon (sigmoidoscopy or colonoscopy), can detect the earliest forms of colorectal cancer. Talk to your caregiver about this at age 50, when routine screening begins. Direct examination of the colon should be repeated every 5 to 10 years through age 75, unless early forms of pre-cancerous polyps or small growths are found.  Hepatitis C blood testing is recommended for all people born from 1945 through 1965 and any individual with known risks for hepatitis C.  Practice  safe sex. Use condoms and avoid high-risk sexual practices to reduce the spread of sexually transmitted infections (STIs). STIs include gonorrhea, chlamydia, syphilis, trichomonas, herpes, HPV, and human immunodeficiency virus (HIV). Herpes, HIV, and HPV are viral illnesses that have no cure. They can result in disability, cancer, and death. Sexually active women aged 25 and younger should be checked for chlamydia. Older women with new or multiple partners should also be tested for chlamydia. Testing for other STIs is recommended if you are sexually active and at increased risk.  Osteoporosis is a disease in which the bones lose minerals and strength with aging. This can result in serious bone fractures. The risk of osteoporosis can be identified using a bone density scan. Women ages 65 and over and women at risk for fractures or osteoporosis should discuss screening with their caregivers. Ask your caregiver whether you should take a calcium supplement or vitamin D to reduce the rate of osteoporosis.  Menopause can be associated with physical symptoms and risks. Hormone replacement therapy is available to decrease symptoms and risks. You should talk to your caregiver about whether hormone replacement therapy is right for you.  Use sunscreen with sun protection factor (SPF) of 30 or more. Apply sunscreen liberally and repeatedly throughout the day. You should seek shade when your shadow is shorter than you. Protect yourself by wearing long sleeves, pants, a wide-brimmed hat, and sunglasses year round, whenever you are outdoors.  Once a month, do a whole body skin exam, using a mirror to look at the skin on your back. Notify your caregiver of new moles, moles that have irregular borders, moles that are larger than a pencil eraser, or moles that have changed in shape or color.  Stay current with required immunizations.  Influenza. You need a dose every fall (or winter). The composition of the flu vaccine  changes each year, so being vaccinated once is not enough.  Pneumococcal polysaccharide. You need 1 to 2 doses if you smoke cigarettes or if you have certain chronic medical conditions. You need 1 dose at age 65 (or older) if you have never been vaccinated.  Tetanus, diphtheria, pertussis (Tdap, Td). Get 1 dose of Tdap vaccine if you are younger than age 65, are over 65 and have contact with an infant, are a healthcare worker, are pregnant, or simply want to be protected from whooping cough. After that, you need a Td   booster dose every 10 years. Consult your caregiver if you have not had at least 3 tetanus and diphtheria-containing shots sometime in your life or have a deep or dirty wound.  HPV. You need this vaccine if you are a woman age 26 or younger. The vaccine is given in 3 doses over 6 months.  Measles, mumps, rubella (MMR). You need at least 1 dose of MMR if you were born in 1957 or later. You may also need a second dose.  Meningococcal. If you are age 19 to 21 and a first-year college student living in a residence hall, or have one of several medical conditions, you need to get vaccinated against meningococcal disease. You may also need additional booster doses.  Zoster (shingles). If you are age 60 or older, you should get this vaccine.  Varicella (chickenpox). If you have never had chickenpox or you were vaccinated but received only 1 dose, talk to your caregiver to find out if you need this vaccine.  Hepatitis A. You need this vaccine if you have a specific risk factor for hepatitis A virus infection or you simply wish to be protected from this disease. The vaccine is usually given as 2 doses, 6 to 18 months apart.  Hepatitis B. You need this vaccine if you have a specific risk factor for hepatitis B virus infection or you simply wish to be protected from this disease. The vaccine is given in 3 doses, usually over 6 months. Preventive Services / Frequency Ages 19 to 39  Blood  pressure check.** / Every 1 to 2 years.  Lipid and cholesterol check.** / Every 5 years beginning at age 20.  Clinical breast exam.** / Every 3 years for women in their 20s and 30s.  Pap test.** / Every 2 years from ages 21 through 29. Every 3 years starting at age 30 through age 65 or 70 with a history of 3 consecutive normal Pap tests.  HPV screening.** / Every 3 years from ages 30 through ages 65 to 70 with a history of 3 consecutive normal Pap tests.  Hepatitis C blood test.** / For any individual with known risks for hepatitis C.  Skin self-exam. / Monthly.  Influenza immunization.** / Every year.  Pneumococcal polysaccharide immunization.** / 1 to 2 doses if you smoke cigarettes or if you have certain chronic medical conditions.  Tetanus, diphtheria, pertussis (Tdap, Td) immunization. / A one-time dose of Tdap vaccine. After that, you need a Td booster dose every 10 years.  HPV immunization. / 3 doses over 6 months, if you are 26 and younger.  Measles, mumps, rubella (MMR) immunization. / You need at least 1 dose of MMR if you were born in 1957 or later. You may also need a second dose.  Meningococcal immunization. / 1 dose if you are age 19 to 21 and a first-year college student living in a residence hall, or have one of several medical conditions, you need to get vaccinated against meningococcal disease. You may also need additional booster doses.  Varicella immunization.** / Consult your caregiver.  Hepatitis A immunization.** / Consult your caregiver. 2 doses, 6 to 18 months apart.  Hepatitis B immunization.** / Consult your caregiver. 3 doses usually over 6 months. Ages 40 to 64  Blood pressure check.** / Every 1 to 2 years.  Lipid and cholesterol check.** / Every 5 years beginning at age 20.  Clinical breast exam.** / Every year after age 40.  Mammogram.** / Every year beginning at age 40   and continuing for as long as you are in good health. Consult with your  caregiver.  Pap test.** / Every 3 years starting at age 30 through age 65 or 70 with a history of 3 consecutive normal Pap tests.  HPV screening.** / Every 3 years from ages 30 through ages 65 to 70 with a history of 3 consecutive normal Pap tests.  Fecal occult blood test (FOBT) of stool. / Every year beginning at age 50 and continuing until age 75. You may not need to do this test if you get a colonoscopy every 10 years.  Flexible sigmoidoscopy or colonoscopy.** / Every 5 years for a flexible sigmoidoscopy or every 10 years for a colonoscopy beginning at age 50 and continuing until age 75.  Hepatitis C blood test.** / For all people born from 1945 through 1965 and any individual with known risks for hepatitis C.  Skin self-exam. / Monthly.  Influenza immunization.** / Every year.  Pneumococcal polysaccharide immunization.** / 1 to 2 doses if you smoke cigarettes or if you have certain chronic medical conditions.  Tetanus, diphtheria, pertussis (Tdap, Td) immunization.** / A one-time dose of Tdap vaccine. After that, you need a Td booster dose every 10 years.  Measles, mumps, rubella (MMR) immunization. / You need at least 1 dose of MMR if you were born in 1957 or later. You may also need a second dose.  Varicella immunization.** / Consult your caregiver.  Meningococcal immunization.** / Consult your caregiver.  Hepatitis A immunization.** / Consult your caregiver. 2 doses, 6 to 18 months apart.  Hepatitis B immunization.** / Consult your caregiver. 3 doses, usually over 6 months. Ages 65 and over  Blood pressure check.** / Every 1 to 2 years.  Lipid and cholesterol check.** / Every 5 years beginning at age 20.  Clinical breast exam.** / Every year after age 40.  Mammogram.** / Every year beginning at age 40 and continuing for as long as you are in good health. Consult with your caregiver.  Pap test.** / Every 3 years starting at age 30 through age 65 or 70 with a 3  consecutive normal Pap tests. Testing can be stopped between 65 and 70 with 3 consecutive normal Pap tests and no abnormal Pap or HPV tests in the past 10 years.  HPV screening.** / Every 3 years from ages 30 through ages 65 or 70 with a history of 3 consecutive normal Pap tests. Testing can be stopped between 65 and 70 with 3 consecutive normal Pap tests and no abnormal Pap or HPV tests in the past 10 years.  Fecal occult blood test (FOBT) of stool. / Every year beginning at age 50 and continuing until age 75. You may not need to do this test if you get a colonoscopy every 10 years.  Flexible sigmoidoscopy or colonoscopy.** / Every 5 years for a flexible sigmoidoscopy or every 10 years for a colonoscopy beginning at age 50 and continuing until age 75.  Hepatitis C blood test.** / For all people born from 1945 through 1965 and any individual with known risks for hepatitis C.  Osteoporosis screening.** / A one-time screening for women ages 65 and over and women at risk for fractures or osteoporosis.  Skin self-exam. / Monthly.  Influenza immunization.** / Every year.  Pneumococcal polysaccharide immunization.** / 1 dose at age 65 (or older) if you have never been vaccinated.  Tetanus, diphtheria, pertussis (Tdap, Td) immunization. / A one-time dose of Tdap vaccine if you are over   65 and have contact with an infant, are a healthcare worker, or simply want to be protected from whooping cough. After that, you need a Td booster dose every 10 years.  Varicella immunization.** / Consult your caregiver.  Meningococcal immunization.** / Consult your caregiver.  Hepatitis A immunization.** / Consult your caregiver. 2 doses, 6 to 18 months apart.  Hepatitis B immunization.** / Check with your caregiver. 3 doses, usually over 6 months. ** Family history and personal history of risk and conditions may change your caregiver's recommendations. Document Released: 08/22/2001 Document Revised: 09/18/2011  Document Reviewed: 11/21/2010 [employer] Patient Information 2014 [employer].  

## 2013-01-07 NOTE — Assessment & Plan Note (Signed)
Stable con't meds 

## 2013-01-08 ENCOUNTER — Encounter: Payer: Self-pay | Admitting: Gastroenterology

## 2013-01-08 ENCOUNTER — Other Ambulatory Visit: Payer: Self-pay

## 2013-02-25 ENCOUNTER — Encounter: Payer: Self-pay | Admitting: Internal Medicine

## 2013-02-25 ENCOUNTER — Telehealth: Payer: Self-pay | Admitting: Internal Medicine

## 2013-02-25 ENCOUNTER — Ambulatory Visit (INDEPENDENT_AMBULATORY_CARE_PROVIDER_SITE_OTHER)
Admission: RE | Admit: 2013-02-25 | Discharge: 2013-02-25 | Disposition: A | Discharge status: Home / Self Care | Payer: Self-pay | Source: Ambulatory Visit | Attending: Internal Medicine | Admitting: Internal Medicine

## 2013-02-25 DIAGNOSIS — R911 Solitary pulmonary nodule: Secondary | ICD-10-CM

## 2013-02-25 NOTE — Telephone Encounter (Signed)
Notes Recorded by Nyoka Cowden, MD on 02/25/2013 at 10:40 AM Call patient : Study is c/w mucus plugging which is typically completely benign but best way to be sure is to take a look with a light.  I would like her to schedule an ov to discuss this is then next couple of weeks Pt advised and appt set. Carron Curie, CMA

## 2013-03-03 ENCOUNTER — Other Ambulatory Visit (INDEPENDENT_AMBULATORY_CARE_PROVIDER_SITE_OTHER): Payer: Self-pay

## 2013-03-03 ENCOUNTER — Encounter: Payer: Self-pay | Admitting: Internal Medicine

## 2013-03-03 ENCOUNTER — Ambulatory Visit (INDEPENDENT_AMBULATORY_CARE_PROVIDER_SITE_OTHER): Payer: Self-pay | Admitting: Internal Medicine

## 2013-03-03 VITALS — BP 130/76 | HR 72 | Temp 97.0°F | Ht 64.0 in | Wt 260.0 lb

## 2013-03-03 DIAGNOSIS — J45909 Unspecified asthma, uncomplicated: Secondary | ICD-10-CM

## 2013-03-03 DIAGNOSIS — R911 Solitary pulmonary nodule: Secondary | ICD-10-CM

## 2013-03-03 LAB — CBC WITH DIFFERENTIAL/PLATELET
Basophils Relative: 1 % (ref 0.0–3.0)
Eosinophils Relative: 2.7 % (ref 0.0–5.0)
Hemoglobin: 14 g/dL (ref 12.0–15.0)
Lymphocytes Relative: 28.6 % (ref 12.0–46.0)
MCHC: 34.2 g/dL (ref 30.0–36.0)
Monocytes Relative: 8.5 % (ref 3.0–12.0)
Neutro Abs: 5.5 10*3/uL (ref 1.4–7.7)
RBC: 4.49 Mil/uL (ref 3.87–5.11)
WBC: 9.4 10*3/uL (ref 4.5–10.5)

## 2013-03-03 MED ORDER — MOMETASONE FURO-FORMOTEROL FUM 200-5 MCG/ACT IN AERO: INHALATION_SPRAY | RESPIRATORY_TRACT | Status: DC

## 2013-03-03 NOTE — Patient Instructions (Addendum)
Please remember to go to the lab department downstairs for your tests - we will call you with the results when they are available.  dulera 200 Take 2 puffs first thing in am and then another 2 puffs about 12 hours later.   Please schedule a follow up office visit in 4 weeks, sooner if needed

## 2013-03-03 NOTE — Assessment & Plan Note (Signed)
-   similar area present on plain cxr 11/2004 p chest wall injury   - CT chest 02/25/2013  Finger-in-glove appearance of mucoid impaction in the right lower lobe, unchanged from 11/06/2012. 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

## 2013-03-03 NOTE — Progress Notes (Signed)
Subjective:    Patient ID: Audrey Duncan, female    DOB: 08/19/1960  MRN: [id number]    Brief patient profile:  89 yowf never smoker but heavy passive exposure as child with ulcerative colitis and "bad asthma" steroid dep as child but eventually completely outgrew with no need for any maint  rx since around 2009, then GI bleeding developed April 2014 > GI w/u showed RLL nodule > PET scan low probability Ca but pos fm hx ca so pt wanted a second opinion and was referred by Dr Jarold Motto for this purpose to pulmonary clinic 11/19/2012    HPI 11/19/2012 1st pulmonary eval cc incidental RLLobular nodule  Childhood asthma prednisone and neb dependent and required prednisone as child and then as adult for UC but better x 5 years and no longer on daily prenisone since around 2009>  And only uses prednisone x 6 days wheezing, sob esp with colds, last cycle was 2013 rec CT chest at 3months    03/03/2013 f/u ov/Ariez Neilan re abn CT dx ? ABPA Chief Complaint  Patient presents with  . Follow-up    Pt states here to discuss recent ct chest results. She denies any co's today.    coughs a lot as long she can remember seems some worse early evening or in am > rubbery pieces like a pencil eraser yellowy  No   assoc chronic cough or cp or chest tightness, subjective wheeze overt sinus or hb symptoms. No unusual exp hx  Though was injured in 2006 on R side of chest with vague RLL density detected on cone films at that time  Sleeping ok without nocturnal  or early am exacerbation  of respiratory  c/o's or need for noct saba. Also denies any obvious fluctuation of symptoms with weather or environmental changes or other aggravating or alleviating factors except as outlined above   Current Medications, Allergies, Past Medical History, Past Surgical History, Family History, and Social History were reviewed in [employer] record.  ROS  The following are not active complaints unless  bolded sore throat, dysphagia, dental problems, itching, sneezing,  nasal congestion or excess/ purulent secretions, ear ache,   fever, chills, sweats, unintended wt loss, pleuritic or exertional cp, hemoptysis,  orthopnea pnd or leg swelling, presyncope, palpitations, heartburn, abdominal pain, anorexia, nausea, vomiting, diarrhea  or change in bowel or urinary habits, change in stools or urine, dysuria,hematuria,  rash, arthralgias, visual complaints, headache, numbness weakness or ataxia or problems with walking or coordination,  change in mood/affect or memory.             Objective:   Physical Exam Obese amb wf nad  03/03/2013      260  Wt Readings from Last 3 Encounters:  11/19/12 263 lb (119.296 kg)  11/19/12 263 lb 8 oz (119.523 kg)  10/28/12 264 lb (119.75 kg)    Obese amb wf nad  HEENT: nl dentition, turbinates, and orophanx. Nl external ear canals without cough reflex   NECK :  without JVD/Nodes/TM/ nl carotid upstrokes bilaterally   LUNGS: no acc muscle use, clear to A and P bilaterally without cough on insp or exp maneuvers   CV:  RRR  no s3 or murmur or increase in P2, no edema   ABD:  soft and nontender with nl excursion in the supine position. No bruits or organomegaly, bowel sounds nl  MS:  warm without deformities, calf tenderness, cyanosis or clubbing  SKIN: warm and dry without lesions  CXR  11/19/2012 :  1. Polygonal shaped nodule in the right lower lobe, measured above. This measures smaller than on the recent PET CT, though a direct comparison is difficult. It may have been vaguely present on a May, 2006 chest examination   CT chst 02/25/13 finger-in-glove appearance of mucoid impaction is again seen in  the right lower lobe, measuring approximately 2.1 x 2.1 cm,  unchanged from 11/06/2012         Assessment & Plan:

## 2013-03-03 NOTE — Assessment & Plan Note (Signed)
The proper method of use, as well as anticipated side effects, of a metered-dose inhaler are discussed and demonstrated to the patient. Improved effectiveness after extensive coaching during this visit to a level of approximately  75%   Since may have ABPA causing prominent density on cxr will check IgE and start trial of dulera 200 2bid and f/u in one month with goal of improving "lifelong cough" and density in RLL    Each maintenance medication was reviewed in detail including most importantly the difference between maintenance and as needed and under what circumstances the prns are to be used.  Please see instructions for details which were reviewed in writing and the patient given a copy.

## 2013-03-04 ENCOUNTER — Encounter: Payer: Self-pay | Admitting: Internal Medicine

## 2013-03-04 LAB — ALLERGY PROFILE REGION II-DC, DE, MD, ~~LOC~~, VA
Bermuda Grass: <0.1 kU/L
Cat Dander: <0.1 kU/L
Dog Dander: <0.1 kU/L
Elm IgE: <0.1 kU/L
IgE (Immunoglobulin E), Serum: 39.5 IU/mL (ref 0.0–180.0)
Johnson Grass: <0.1 kU/L
Lamb's Quarters: <0.1 kU/L
Meadow Grass: <0.1 kU/L
Pecan/Hickory Tree IgE: <0.1 kU/L

## 2013-03-05 NOTE — Progress Notes (Signed)
Quick Note:  Spoke with pt and notified of results per Dr. Wert. Pt verbalized understanding and denied any questions.  ______ 

## 2013-03-19 ENCOUNTER — Encounter: Payer: Self-pay | Admitting: Internal Medicine

## 2013-04-02 ENCOUNTER — Ambulatory Visit: Payer: Self-pay | Admitting: Internal Medicine

## 2013-07-15 ENCOUNTER — Telehealth: Payer: Self-pay | Admitting: Family Medicine

## 2013-07-15 MED ORDER — HYDROCHLOROTHIAZIDE 25 MG PO TABS: ORAL_TABLET | ORAL | Status: DC

## 2013-07-15 NOTE — Telephone Encounter (Signed)
Patient needs a refill on her hydrochlorothiazide (HYDRODIURIL) 25 MG. She is requesting a 90-day supply to [facility].

## 2013-07-23 ENCOUNTER — Ambulatory Visit: Payer: Self-pay | Admitting: Family Medicine

## 2013-10-20 ENCOUNTER — Other Ambulatory Visit: Payer: Self-pay | Admitting: Family Medicine

## 2014-01-25 ENCOUNTER — Other Ambulatory Visit: Payer: Self-pay | Admitting: Family Medicine

## 2014-01-26 NOTE — Telephone Encounter (Signed)
Rx sent to the pharmacy by e-script.  Pt needs office visit now.//AB/CMA

## 2014-03-04 ENCOUNTER — Other Ambulatory Visit: Payer: Self-pay | Admitting: Family Medicine

## 2014-03-04 DIAGNOSIS — Z1231 Encounter for screening mammogram for malignant neoplasm of breast: Secondary | ICD-10-CM

## 2014-03-13 ENCOUNTER — Ambulatory Visit (HOSPITAL_COMMUNITY)
Admission: RE | Admit: 2014-03-13 | Discharge: 2014-03-13 | Disposition: A | Discharge status: Home / Self Care | Payer: Self-pay | Source: Ambulatory Visit | Attending: Family Medicine | Admitting: Family Medicine

## 2014-03-13 DIAGNOSIS — Z1231 Encounter for screening mammogram for malignant neoplasm of breast: Secondary | ICD-10-CM | POA: Insufficient documentation

## 2014-05-04 ENCOUNTER — Other Ambulatory Visit: Payer: Self-pay | Admitting: Family Medicine

## 2014-05-04 ENCOUNTER — Encounter: Payer: Self-pay | Admitting: Family Medicine

## 2014-05-14 ENCOUNTER — Encounter: Payer: Self-pay | Admitting: Family Medicine

## 2014-06-08 ENCOUNTER — Other Ambulatory Visit: Payer: Self-pay | Admitting: Family Medicine

## 2014-06-09 NOTE — Telephone Encounter (Signed)
Patient needs an appointment with Dr.Lowne before we can refill. Please schedule      KP

## 2014-06-12 ENCOUNTER — Encounter: Payer: Self-pay | Admitting: Family Medicine

## 2014-06-12 ENCOUNTER — Ambulatory Visit (INDEPENDENT_AMBULATORY_CARE_PROVIDER_SITE_OTHER): Payer: Self-pay | Admitting: Family Medicine

## 2014-06-12 VITALS — BP 118/78 | HR 84 | Temp 97.9°F | Ht 64.0 in | Wt 270.6 lb

## 2014-06-12 DIAGNOSIS — Z0000 Encounter for general adult medical examination without abnormal findings: Secondary | ICD-10-CM

## 2014-06-12 DIAGNOSIS — I10 Essential (primary) hypertension: Secondary | ICD-10-CM

## 2014-06-12 DIAGNOSIS — Z23 Encounter for immunization: Secondary | ICD-10-CM

## 2014-06-12 DIAGNOSIS — E876 Hypokalemia: Secondary | ICD-10-CM

## 2014-06-12 LAB — POCT URINALYSIS DIPSTICK
Bilirubin, UA: NEGATIVE
Blood, UA: NEGATIVE
Glucose, UA: NEGATIVE
Ketones, UA: NEGATIVE
Leukocytes, UA: NEGATIVE
NITRITE UA: NEGATIVE
Spec Grav, UA: >=1.03
UROBILINOGEN UA: 0.2
pH, UA: 5.5

## 2014-06-12 NOTE — Progress Notes (Signed)
Pre visit review using our clinic review tool, if applicable. No additional management support is needed unless otherwise documented below in the visit note. 

## 2014-06-12 NOTE — Progress Notes (Signed)
Subjective:     Audrey Duncan is a 53 y.o. female and is here for a comprehensive physical exam. The patient reports no problems.  History   Social History  . Marital Status: Married    Spouse Name: N/A    Number of Children: N/A  . Years of Education: N/A   Occupational History  . self employed--pest control    Social History Main Topics  . Smoking status: Never Smoker   . Smokeless tobacco: Never Used  . Alcohol Use: Yes     Comment: 1 glass of wine 1-2 x a month  . Drug Use: No  . Sexual Activity:    Partners: Male   Other Topics Concern  . Not on file   Social History Narrative   Health Maintenance  Topic Date Due  . PAP SMEAR  03/30/2013  . INFLUENZA VACCINE  02/08/2015  . COLONOSCOPY  05/19/2015  . MAMMOGRAM  03/13/2016  . TETANUS/TDAP  03/28/2020    The following portions of the patient's history were reviewed and updated as appropriate:  She  has a past medical history of Asthma; Colon polyp (2006); Chiari malformation type II; and Ulcerative colitis. She  does not have any pertinent problems on file. She  has past surgical history that includes Hernia repair (2004); Abdominal hysterectomy (1999); and Cesarean section (1995 and 1998). Her family history includes Alcohol abuse in her brother and father; Asthma in her sister; COPD in her father; Cancer in her father and mother; Diabetes in her paternal uncle; Heart disease (age of onset: 71) in her mother; Hypertension in her father and mother; Lung cancer in her mother; Stroke (age of onset: 26) in her mother. She  reports that she has never smoked. She has never used smokeless tobacco. She reports that she drinks alcohol. She reports that she does not use illicit drugs. She has a current medication list which includes the following prescription(s): hydrochlorothiazide, mometasone-formoterol, and multiple vitamins-minerals. Current Outpatient Prescriptions on File Prior to Visit  Medication Sig Dispense  Refill  . hydrochlorothiazide (HYDRODIURIL) 25 MG tablet TAKE 1 TABLET BY MOUTH EVERY DAY 30 tablet 0  . mometasone-formoterol (DULERA) 200-5 MCG/ACT AERO Take 2 puffs first thing in am and then another 2 puffs about 12 hours later.  0  . Multiple Vitamins-Minerals (CENTRUM SILVER PO) Take by mouth every morning.     No current facility-administered medications on file prior to visit.   She has No Known Allergies..  Review of Systems Review of Systems  Constitutional: Negative for activity change, appetite change and fatigue.  HENT: Negative for hearing loss, congestion, tinnitus and ear discharge.  dentist q29m Eyes: Negative for visual disturbance (see optho q1y -- vision corrected to 20/20 with glasses).  Respiratory: Negative for cough, chest tightness and shortness of breath.   Cardiovascular: Negative for chest pain, palpitations and leg swelling.  Gastrointestinal: Negative for abdominal pain, diarrhea, constipation and abdominal distention.  Genitourinary: Negative for urgency, frequency, decreased urine volume and difficulty urinating.  Musculoskeletal: Negative for back pain, arthralgias and gait problem.  Skin: Negative for color change, pallor and rash.  Neurological: Negative for dizziness, light-headedness, numbness and headaches.  Hematological: Negative for adenopathy. Does not bruise/bleed easily.  Psychiatric/Behavioral: Negative for suicidal ideas, confusion, sleep disturbance, self-injury, dysphoric mood, decreased concentration and agitation.      Objective:    BP 118/78 mmHg  Pulse 84  Temp(Src) 97.9 F (36.6 C) (Oral)  Ht 5\' 4"  (1.626 m)  Wt 270 lb  9.6 oz (122.743 kg)  BMI 46.43 kg/m2  SpO2 96% General appearance: alert, cooperative, appears stated age and no distress Head: Normocephalic, without obvious abnormality, atraumatic Eyes: conjunctivae/corneas clear. PERRL, EOM's intact. Fundi benign. Ears: normal TM's and external ear canals both ears Nose:  Nares normal. Septum midline. Mucosa normal. No drainage or sinus tenderness. Throat: lips, mucosa, and tongue normal; teeth and gums normal Neck: no adenopathy, no carotid bruit, no JVD, supple, symmetrical, trachea midline and thyroid not enlarged, symmetric, no tenderness/mass/nodules Back: symmetric, no curvature. ROM normal. No CVA tenderness. Lungs: clear to auscultation bilaterally Breasts: gyn Heart: regular rate and rhythm, S1, S2 normal, no murmur, click, rub or gallop Abdomen: soft, non-tender; bowel sounds normal; no masses,  no organomegaly Pelvic: deferred --gyn Extremities: extremities normal, atraumatic, no cyanosis or edema Pulses: 2+ and symmetric Skin: Skin color, texture, turgor normal. No rashes or lesions Lymph nodes: Cervical, supraclavicular, and axillary nodes normal. Neurologic: Alert and oriented X 3, normal strength and tone. Normal symmetric reflexes. Normal coordination and gait Psych- no depression, no anxiety      Assessment:    Healthy female exam.     Plan:  1. Preventative health care  - Basic metabolic panel - CBC with Differential - Hepatic function panel - Lipid panel - POCT urinalysis dipstick - TSH  2. Essential hypertension Check labs, stable - Basic metabolic panel - CBC with Differential - Hepatic function panel - Lipid panel - POCT urinalysis dipstick - TSH  3. Hypokalemia   - Basic metabolic panel  4. Need for prophylactic vaccination and inoculation against influenza   - Flu Vaccine QUAD 36+ mos PF IM (Fluarix Quad PF)  5. Severe obesity (BMI >= 40)      See After Visit Summary for Counseling Recommendations

## 2014-06-14 LAB — LIPID PANEL
CHOL/HDL RATIO: 3
Cholesterol: 170 mg/dL (ref 0–200)
HDL: 50.8 mg/dL (ref >39.00)
LDL CALC: 101 mg/dL — AB (ref 0–99)
NONHDL: 119.2
Triglycerides: 93 mg/dL (ref 0.0–149.0)
VLDL: 18.6 mg/dL (ref 0.0–40.0)

## 2014-06-14 LAB — BASIC METABOLIC PANEL
BUN: 19 mg/dL (ref 6–23)
CHLORIDE: 110 meq/L (ref 96–112)
CO2: 21 meq/L (ref 19–32)
Calcium: 10.2 mg/dL (ref 8.4–10.5)
Creatinine, Ser: 0.9 mg/dL (ref 0.4–1.2)
GFR: 70.79 mL/min (ref >60.00)
GLUCOSE: 77 mg/dL (ref 70–99)
POTASSIUM: 4.2 meq/L (ref 3.5–5.1)
SODIUM: 149 meq/L — AB (ref 135–145)

## 2014-06-14 LAB — CBC WITH DIFFERENTIAL/PLATELET
Basophils Absolute: 0.1 10*3/uL (ref 0.0–0.1)
Basophils Relative: 0.9 % (ref 0.0–3.0)
Eosinophils Absolute: 0.2 10*3/uL (ref 0.0–0.7)
Eosinophils Relative: 3.2 % (ref 0.0–5.0)
HEMATOCRIT: 42.9 % (ref 36.0–46.0)
HEMOGLOBIN: 14.2 g/dL (ref 12.0–15.0)
LYMPHS ABS: 1.7 10*3/uL (ref 0.7–4.0)
Lymphocytes Relative: 26.8 % (ref 12.0–46.0)
MCHC: 33 g/dL (ref 30.0–36.0)
MCV: 93.3 fl (ref 78.0–100.0)
Monocytes Absolute: 0.2 10*3/uL (ref 0.1–1.0)
Monocytes Relative: 3.2 % (ref 3.0–12.0)
NEUTROS ABS: 4.2 10*3/uL (ref 1.4–7.7)
Neutrophils Relative %: 65.9 % (ref 43.0–77.0)
Platelets: 267 10*3/uL (ref 150.0–400.0)
RBC: 4.59 Mil/uL (ref 3.87–5.11)
RDW: 13 % (ref 11.5–15.5)
WBC: 6.4 10*3/uL (ref 4.0–10.5)

## 2014-06-14 LAB — HEPATIC FUNCTION PANEL
ALK PHOS: 88 U/L (ref 39–117)
ALT: 29 U/L (ref 0–35)
AST: 28 U/L (ref 0–37)
Albumin: 4.3 g/dL (ref 3.5–5.2)
BILIRUBIN DIRECT: 0.1 mg/dL (ref 0.0–0.3)
TOTAL PROTEIN: 7.7 g/dL (ref 6.0–8.3)
Total Bilirubin: 1 mg/dL (ref 0.2–1.2)

## 2014-06-14 LAB — TSH: TSH: 1.87 u[IU]/mL (ref 0.35–4.50)

## 2014-06-15 ENCOUNTER — Telehealth: Payer: Self-pay

## 2014-06-15 MED ORDER — HYDROCHLOROTHIAZIDE 25 MG PO TABS: 25.0000 mg | ORAL_TABLET | Freq: Every day | ORAL | Status: DC

## 2014-06-15 NOTE — Telephone Encounter (Signed)
Pharmacy:  [address]  Request:  Hydrochlorothiazide 25 mg tablets--take 1 tablet by mouth every day  Last filled:  05/04/14 Amt: 30, 0 refill  Last OV: 06/12/14 Labs: 06/12/14   Med filled.  30 tablets, 5 refills.

## 2014-06-18 ENCOUNTER — Telehealth: Payer: Self-pay | Admitting: Family Medicine

## 2014-06-18 NOTE — Telephone Encounter (Signed)
Please advise      KP 

## 2014-06-18 NOTE — Telephone Encounter (Signed)
Yes because the hctz will lower potassium

## 2014-06-18 NOTE — Telephone Encounter (Signed)
Caller name: Adrielle Relation to pt: self Call back number: [phone] or [phone] Pharmacy:  Reason for call:   Patient knows that all labs are normal but wants to know if she should fill her potassium rx

## 2014-06-19 NOTE — Telephone Encounter (Signed)
patientt has been made aware and voiced understanding.     KP

## 2014-06-19 NOTE — Telephone Encounter (Signed)
Msg left to call the office     KP 

## 2015-04-22 ENCOUNTER — Telehealth: Payer: Self-pay | Admitting: Family Medicine

## 2015-04-22 NOTE — Telephone Encounter (Signed)
She needs an Office visit to discuss.     KP

## 2015-04-22 NOTE — Telephone Encounter (Signed)
Caller name: Brittania Sudbeck  Relationship to patient: Self   Can be reached: [phone]  Pharmacy:  Reason for call: Pt called in because she says that she has been struggling to loose weight. She says that a family member suggested Belviq to her after their weight loss success.[name] Kitchen She wants to know if she can take them also to help her.     Please advise.

## 2015-04-22 NOTE — Telephone Encounter (Signed)
Called pt to schedule appt. No answer. LVM for pt to call back to schedule.

## 2015-04-29 ENCOUNTER — Encounter: Payer: Self-pay | Admitting: Family Medicine

## 2015-04-29 ENCOUNTER — Ambulatory Visit (INDEPENDENT_AMBULATORY_CARE_PROVIDER_SITE_OTHER): Payer: Self-pay | Admitting: Family Medicine

## 2015-04-29 VITALS — BP 120/74 | HR 78 | Temp 98.5°F | Ht 64.0 in | Wt 280.0 lb

## 2015-04-29 DIAGNOSIS — E876 Hypokalemia: Secondary | ICD-10-CM

## 2015-04-29 DIAGNOSIS — I10 Essential (primary) hypertension: Secondary | ICD-10-CM

## 2015-04-29 DIAGNOSIS — Z23 Encounter for immunization: Secondary | ICD-10-CM

## 2015-04-29 MED ORDER — LORCASERIN HCL ER 20 MG PO TB24: 1.0000 | ORAL_TABLET | Freq: Every day | ORAL | Status: DC

## 2015-04-29 NOTE — Progress Notes (Signed)
Pre visit review using our clinic review tool, if applicable. No additional management support is needed unless otherwise documented below in the visit note. 

## 2015-04-29 NOTE — Patient Instructions (Signed)
Hypertension Hypertension, commonly called high blood pressure, is when the force of blood pumping through your arteries is too strong. Your arteries are the blood vessels that carry blood from your heart throughout your body. A blood pressure reading consists of a higher number over a lower number, such as 110/72. The higher number (systolic) is the pressure inside your arteries when your heart pumps. The lower number (diastolic) is the pressure inside your arteries when your heart relaxes. Ideally you want your blood pressure below 120/80. Hypertension forces your heart to work harder to pump blood. Your arteries may become narrow or stiff. Having untreated or uncontrolled hypertension can cause heart attack, stroke, kidney disease, and other problems. RISK FACTORS Some risk factors for high blood pressure are controllable. Others are not.  Risk factors you cannot control include:   Race. You may be at higher risk if you are African American.  Age. Risk increases with age.  Gender. Men are at higher risk than women before age 45 years. After age 65, women are at higher risk than men. Risk factors you can control include:  Not getting enough exercise or physical activity.  Being overweight.  Getting too much fat, sugar, calories, or salt in your diet.  Drinking too much alcohol. SIGNS AND SYMPTOMS Hypertension does not usually cause signs or symptoms. Extremely high blood pressure (hypertensive crisis) may cause headache, anxiety, shortness of breath, and nosebleed. DIAGNOSIS To check if you have hypertension, your health care provider will measure your blood pressure while you are seated, with your arm held at the level of your heart. It should be measured at least twice using the same arm. Certain conditions can cause a difference in blood pressure between your right and left arms. A blood pressure reading that is higher than normal on one occasion does not mean that you need treatment. If  it is not clear whether you have high blood pressure, you may be asked to return on a different day to have your blood pressure checked again. Or, you may be asked to monitor your blood pressure at home for 1 or more weeks. TREATMENT Treating high blood pressure includes making lifestyle changes and possibly taking medicine. Living a healthy lifestyle can help lower high blood pressure. You may need to change some of your habits. Lifestyle changes may include:  Following the DASH diet. This diet is high in fruits, vegetables, and whole grains. It is low in salt, red meat, and added sugars.  Keep your sodium intake below 2,300 mg per day.  Getting at least 30-45 minutes of aerobic exercise at least 4 times per week.  Losing weight if necessary.  Not smoking.  Limiting alcoholic beverages.  Learning ways to reduce stress. Your health care provider may prescribe medicine if lifestyle changes are not enough to get your blood pressure under control, and if one of the following is true:  You are 18-59 years of age and your systolic blood pressure is above 140.  You are 60 years of age or older, and your systolic blood pressure is above 150.  Your diastolic blood pressure is above 90.  You have diabetes, and your systolic blood pressure is over 140 or your diastolic blood pressure is over 90.  You have kidney disease and your blood pressure is above 140/90.  You have heart disease and your blood pressure is above 140/90. Your personal target blood pressure may vary depending on your medical conditions, your age, and other factors. HOME CARE INSTRUCTIONS    Have your blood pressure rechecked as directed by your health care provider.   Take medicines only as directed by your health care provider. Follow the directions carefully. Blood pressure medicines must be taken as prescribed. The medicine does not work as well when you skip doses. Skipping doses also puts you at risk for  problems.  Do not smoke.   Monitor your blood pressure at home as directed by your health care provider. SEEK MEDICAL CARE IF:   You think you are having a reaction to medicines taken.  You have recurrent headaches or feel dizzy.  You have swelling in your ankles.  You have trouble with your vision. SEEK IMMEDIATE MEDICAL CARE IF:  You develop a severe headache or confusion.  You have unusual weakness, numbness, or feel faint.  You have severe chest or abdominal pain.  You vomit repeatedly.  You have trouble breathing. MAKE SURE YOU:   Understand these instructions.  Will watch your condition.  Will get help right away if you are not doing well or get worse.   This information is not intended to replace advice given to you by your health care provider. Make sure you discuss any questions you have with your health care provider.   Document Released: 06/26/2005 Document Revised: 11/10/2014 Document Reviewed: 04/18/2013 Elsevier Interactive Patient Education 2016 [employer] Inc[employer]  

## 2015-05-01 NOTE — Assessment & Plan Note (Signed)
con't potassium Check labs

## 2015-05-01 NOTE — Assessment & Plan Note (Signed)
hctz daily Check labs

## 2015-05-01 NOTE — Progress Notes (Signed)
Patient ID: Audrey Duncan, female    DOB: 11/15/1960  Age: 54 y.o. MRN: [id number]    Subjective:  Subjective HPI Audrey Duncan presents for f/u htn .  No cp, sob, or palpitations.    Pt is also c/o frustrated because she is struggling to lose weight.  Her brother has lost a lot with belviq and she would like to try it.   Pt is exercising and watching diet.     Review of Systems  Constitutional: Negative for diaphoresis, appetite change, fatigue and unexpected weight change.  Eyes: Negative for pain, redness and visual disturbance.  Respiratory: Negative for cough, chest tightness, shortness of breath and wheezing.   Cardiovascular: Negative for chest pain, palpitations and leg swelling.  Endocrine: Negative for cold intolerance, heat intolerance, polydipsia, polyphagia and polyuria.  Genitourinary: Negative for dysuria, frequency and difficulty urinating.  Neurological: Negative for dizziness, light-headedness, numbness and headaches.  All other systems reviewed and are negative.   History Past Medical History  Diagnosis Date  . Asthma   . Colon polyp 2006    (TUBULAR ADENOMA)--COLONOSCOPY-DR. HAYES  . Chiari malformation type II ([place])     diagnosed when in high school  . Ulcerative colitis ([place])     She has past surgical history that includes Hernia repair (2004); Abdominal hysterectomy (1999); and Cesarean section (1995 and 1998).   Her family history includes Alcohol abuse in her brother and father; Asthma in her sister; COPD in her father; Cancer in her father and mother; Diabetes in her paternal uncle; Heart disease (age of onset: 7) in her mother; Hypertension in her father and mother; Lung cancer in her mother; Stroke (age of onset: 51) in her mother.She reports that she has never smoked. She has never used smokeless tobacco. She reports that she drinks alcohol. She reports that she does not use illicit drugs.  Current Outpatient Prescriptions on File Prior to  Visit  Medication Sig Dispense Refill  . hydrochlorothiazide (HYDRODIURIL) 25 MG tablet Take 1 tablet (25 mg total) by mouth daily. 30 tablet 5  . mometasone-formoterol (DULERA) 200-5 MCG/ACT AERO Take 2 puffs first thing in am and then another 2 puffs about 12 hours later.  0  . Multiple Vitamins-Minerals (CENTRUM SILVER PO) Take by mouth every morning.     No current facility-administered medications on file prior to visit.     Objective:  Objective Physical Exam BP 120/74 mmHg  Pulse 78  Temp(Src) 98.5 F (36.9 C) (Oral)  Ht 5\' 4"  (1.626 m)  Wt 280 lb (127.007 kg)  BMI 48.04 kg/m2  SpO2 98% Wt Readings from Last 3 Encounters:  04/29/15 280 lb (127.007 kg)  06/12/14 270 lb 9.6 oz (122.743 kg)  03/03/13 260 lb (117.935 kg)     Lab Results  Component Value Date   WBC 6.4 06/12/2014   HGB 14.2 06/12/2014   HCT 42.9 06/12/2014   PLT 267.0 06/12/2014   GLUCOSE 77 06/12/2014   CHOL 170 06/12/2014   TRIG 93.0 06/12/2014   HDL 50.80 06/12/2014   LDLCALC 101* 06/12/2014   ALT 29 06/12/2014   AST 28 06/12/2014   NA 149* 06/12/2014   K 4.2 06/12/2014   CL 110 06/12/2014   CREATININE 0.9 06/12/2014   BUN 19 06/12/2014   CO2 21 06/12/2014   TSH 1.87 06/12/2014    Mm Screening Breast Tomo Bilateral  03/17/2014  CLINICAL DATA:  Screening.  DIGITAL BREAST TOMOSYNTHESIS Digital breast tomosynthesis images are acquired in two projections.  These images are reviewed in combination with the digital mammogram, confirming the findings below. EXAM: DIGITAL SCREENING BILATERAL MAMMOGRAM WITH 3D TOMO WITH CAD COMPARISON:  Previous exam(s) ACR Breast Density Category a: The breast tissue is almost entirely fatty. FINDINGS: There are no findings suspicious for malignancy. Images were processed with CAD. IMPRESSION: No mammographic evidence of malignancy. A result letter of this screening mammogram will be mailed directly to the patient. RECOMMENDATION: Screening mammogram in one year.  (Code:SM-B-01Y) BI-RADS CATEGORY  1: Negative. Electronically Signed   By: Luberta Robertson M.D.   On: 03/17/2014 13:21     Assessment & Plan:  Plan I am having Ms. Showers start on Lorcaserin HCl ER. I am also having her maintain her Multiple Vitamins-Minerals (CENTRUM SILVER PO), mometasone-formoterol, hydrochlorothiazide, and potassium chloride SA.  Meds ordered this encounter  Medications  . potassium chloride SA (K-DUR,KLOR-CON) 20 MEQ tablet    Sig: Take 20 mEq by mouth 2 (two) times daily.  . Lorcaserin HCl ER (BELVIQ XR) 20 MG TB24    Sig: Take 1 tablet by mouth daily.    Dispense:  30 tablet    Refill:  3    Problem List Items Addressed This Visit    Severe obesity (BMI >= 40) (HCC)    Start belviq con't diet and exercise rto 3 months       Relevant Medications   Lorcaserin HCl ER (BELVIQ XR) 20 MG TB24   HYPOKALEMIA    con't potassium Check labs      Essential hypertension - Primary    hctz daily Check labs       Other Visit Diagnoses    Need for immunization against influenza        Relevant Orders    Flu Vaccine QUAD 36+ mos IM (Fluarix) (Completed)       Follow-up: Return in about 3 months (around 07/30/2015), or if symptoms worsen or fail to improve, for weight check, hypertension.  Garnet Koyanagi, DO

## 2015-05-01 NOTE — Assessment & Plan Note (Signed)
Start belviq con't diet and exercise rto 3 months

## 2015-06-02 ENCOUNTER — Other Ambulatory Visit: Payer: Self-pay | Admitting: Family Medicine

## 2015-07-30 ENCOUNTER — Ambulatory Visit: Payer: Self-pay | Admitting: Family Medicine

## 2015-08-28 ENCOUNTER — Other Ambulatory Visit: Payer: Self-pay | Admitting: Family Medicine

## 2015-09-10 ENCOUNTER — Other Ambulatory Visit: Payer: Self-pay | Admitting: Family Medicine

## 2015-09-10 NOTE — Telephone Encounter (Signed)
Please schedule the patient an apt, she is due for a CPE   KP

## 2015-09-16 NOTE — Telephone Encounter (Signed)
Left message for patient to call to schedule CPE

## 2015-09-29 ENCOUNTER — Other Ambulatory Visit: Payer: Self-pay

## 2015-09-29 DIAGNOSIS — Z1231 Encounter for screening mammogram for malignant neoplasm of breast: Secondary | ICD-10-CM

## 2015-10-14 ENCOUNTER — Ambulatory Visit
Admission: RE | Admit: 2015-10-14 | Discharge: 2015-10-14 | Disposition: A | Discharge status: Home / Self Care | Payer: No Typology Code available for payment source | Source: Ambulatory Visit

## 2015-10-14 DIAGNOSIS — Z1231 Encounter for screening mammogram for malignant neoplasm of breast: Secondary | ICD-10-CM

## 2015-12-02 ENCOUNTER — Other Ambulatory Visit: Payer: Self-pay | Admitting: Family Medicine

## 2016-03-28 ENCOUNTER — Encounter: Payer: Self-pay | Admitting: Family Medicine

## 2016-03-28 ENCOUNTER — Ambulatory Visit (INDEPENDENT_AMBULATORY_CARE_PROVIDER_SITE_OTHER): Payer: Self-pay | Admitting: Family Medicine

## 2016-03-28 VITALS — BP 130/86 | HR 86 | Temp 98.0°F | Resp 16 | Ht 64.0 in | Wt 280.8 lb

## 2016-03-28 DIAGNOSIS — Z8719 Personal history of other diseases of the digestive system: Secondary | ICD-10-CM

## 2016-03-28 DIAGNOSIS — E876 Hypokalemia: Secondary | ICD-10-CM

## 2016-03-28 DIAGNOSIS — G4700 Insomnia, unspecified: Secondary | ICD-10-CM

## 2016-03-28 MED ORDER — POTASSIUM CHLORIDE CRYS ER 20 MEQ PO TBCR: 20.0000 meq | EXTENDED_RELEASE_TABLET | Freq: Every day | ORAL | 3 refills | Status: DC

## 2016-03-28 MED ORDER — FAMOTIDINE 10 MG PO CHEW: 20.0000 mg | CHEWABLE_TABLET | Freq: Two times a day (BID) | ORAL | Status: DC

## 2016-03-28 MED ORDER — ALPRAZOLAM 0.25 MG PO TABS: 0.2500 mg | ORAL_TABLET | Freq: Every evening | ORAL | 0 refills | Status: DC | PRN

## 2016-03-28 NOTE — Patient Instructions (Signed)

## 2016-03-28 NOTE — Progress Notes (Signed)
Pre visit review using our clinic review tool, if applicable. No additional management support is needed unless otherwise documented below in the visit note. 

## 2016-03-28 NOTE — Progress Notes (Signed)
Patient ID: Audrey Duncan, female    DOB: 06-27-1961  Age: 55 y.o. MRN: [id number]    Subjective:  Subjective  HPI Audrey Duncan presents for f/u potassium and c/o rectal bleeding ---- pt c/o blood in toilet and on paper.  She does not notice it with every BM.   Some heartburn also.    Review of Systems  Constitutional: Negative for appetite change, diaphoresis, fatigue and unexpected weight change.  Eyes: Negative for pain, redness and visual disturbance.  Respiratory: Negative for cough, chest tightness, shortness of breath and wheezing.   Cardiovascular: Negative for chest pain, palpitations and leg swelling.  Gastrointestinal:       +heartburn  Endocrine: Negative for cold intolerance, heat intolerance, polydipsia, polyphagia and polyuria.  Genitourinary: Negative for difficulty urinating, dysuria and frequency.  Neurological: Negative for dizziness, light-headedness, numbness and headaches.    History Past Medical History:  Diagnosis Date  . Asthma   . Chiari malformation type II ([place])    diagnosed when in high school  . Colon polyp 2006   (TUBULAR ADENOMA)--COLONOSCOPY-DR. HAYES  . Ulcerative colitis ([place])     She has a past surgical history that includes Hernia repair (2004); Abdominal hysterectomy (1999); and Cesarean section (1995 and 1998).   Her family history includes Alcohol abuse in her brother and father; Asthma in her sister; COPD in her father; Cancer in her father and mother; Diabetes in her paternal uncle; Heart disease (age of onset: 54) in her mother; Hypertension in her father and mother; Lung cancer in her mother; Stroke (age of onset: 65) in her mother.She reports that she has never smoked. She has never used smokeless tobacco. She reports that she drinks alcohol. She reports that she does not use drugs.  Current Outpatient Prescriptions on File Prior to Visit  Medication Sig Dispense Refill  . hydrochlorothiazide (HYDRODIURIL) 25 MG tablet Take  1 tablet (25 mg total) by mouth daily. 30 tablet 0  . mometasone-formoterol (DULERA) 200-5 MCG/ACT AERO Take 2 puffs first thing in am and then another 2 puffs about 12 hours later.  0  . Multiple Vitamins-Minerals (CENTRUM SILVER PO) Take by mouth every morning.     No current facility-administered medications on file prior to visit.      Objective:  Objective  Physical Exam  Constitutional: She is oriented to person, place, and time. She appears well-developed and well-nourished.  HENT:  Head: Normocephalic and atraumatic.  Eyes: Conjunctivae and EOM are normal.  Neck: Normal range of motion. Neck supple. No JVD present. Carotid bruit is not present. No thyromegaly present.  Cardiovascular: Normal rate, regular rhythm and normal heart sounds.   No murmur heard. Pulmonary/Chest: Effort normal and breath sounds normal. No respiratory distress. She has no wheezes. She has no rales. She exhibits no tenderness.  Musculoskeletal: She exhibits no edema.  Neurological: She is alert and oriented to person, place, and time.  Psychiatric: She has a normal mood and affect. Her behavior is normal. Judgment and thought content normal.  Nursing note and vitals reviewed.  BP 130/86 (BP Location: Right Arm, Patient Position: Sitting, Cuff Size: Normal)   Pulse 86   Temp 98 F (36.7 C) (Oral)   Resp 16   Ht 5\' 4"  (1.626 m)   Wt 280 lb 12.8 oz (127.4 kg)   SpO2 95%   BMI 48.20 kg/m  Wt Readings from Last 3 Encounters:  03/28/16 280 lb 12.8 oz (127.4 kg)  04/29/15 280 lb (127 kg)  06/12/14 270 lb 9.6 oz (122.7 kg)     Lab Results  Component Value Date   WBC 6.4 06/12/2014   HGB 14.2 06/12/2014   HCT 42.9 06/12/2014   PLT 267.0 06/12/2014   GLUCOSE 77 06/12/2014   CHOL 170 06/12/2014   TRIG 93.0 06/12/2014   HDL 50.80 06/12/2014   LDLCALC 101 (H) 06/12/2014   ALT 29 06/12/2014   AST 28 06/12/2014   NA 149 (H) 06/12/2014   K 4.2 06/12/2014   CL 110 06/12/2014   CREATININE 0.9  06/12/2014   BUN 19 06/12/2014   CO2 21 06/12/2014   TSH 1.87 06/12/2014    Mm Screening Breast Tomo Bilateral  Result Date: 10/15/2015 CLINICAL DATA:  Screening. EXAM: 2D DIGITAL SCREENING BILATERAL MAMMOGRAM WITH CAD AND ADJUNCT TOMO COMPARISON:  Previous exam(s). ACR Breast Density Category b: There are scattered areas of fibroglandular density. FINDINGS: There are no findings suspicious for malignancy. Images were processed with CAD. IMPRESSION: No mammographic evidence of malignancy. A result letter of this screening mammogram will be mailed directly to the patient. RECOMMENDATION: Screening mammogram in one year. (Code:SM-B-01Y) BI-RADS CATEGORY  1: Negative. Electronically Signed   By: Lillia Mountain M.D.   On: 10/15/2015 10:35     Assessment & Plan:  Plan  I have discontinued Ms. Schloemer's Lorcaserin HCl ER. I have also changed her potassium chloride SA. Additionally, I am having her start on famotidine and ALPRAZolam. Lastly, I am having her maintain her Multiple Vitamins-Minerals (CENTRUM SILVER PO), mometasone-formoterol, and hydrochlorothiazide.  Meds ordered this encounter  Medications  . potassium chloride SA (K-DUR,KLOR-CON) 20 MEQ tablet    Sig: Take 1 tablet (20 mEq total) by mouth daily.    Dispense:  90 tablet    Refill:  3  . famotidine (PEPCID AC) 10 MG chewable tablet    Sig: Chew 2 tablets (20 mg total) by mouth 2 (two) times daily.  [name] Kitchen ALPRAZolam (XANAX) 0.25 MG tablet    Sig: Take 1 tablet (0.25 mg total) by mouth at bedtime as needed for anxiety.    Dispense:  30 tablet    Refill:  0    Problem List Items Addressed This Visit    None    Visit Diagnoses    Hypokalemia    -  Primary   Relevant Medications   potassium chloride SA (K-DUR,KLOR-CON) 20 MEQ tablet   History of rectal bleeding       Relevant Medications   famotidine (PEPCID AC) 10 MG chewable tablet   Other Relevant Orders   Ambulatory referral to Gastroenterology   Fecal occult blood,  imunochemical   Insomnia       Relevant Medications   ALPRAZolam (XANAX) 0.25 MG tablet      Follow-up: Return for as scheduled.  [name], DO

## 2016-03-31 ENCOUNTER — Ambulatory Visit (INDEPENDENT_AMBULATORY_CARE_PROVIDER_SITE_OTHER): Payer: Self-pay | Admitting: Gastroenterology

## 2016-03-31 ENCOUNTER — Encounter: Payer: Self-pay | Admitting: Gastroenterology

## 2016-03-31 VITALS — Ht 65.0 in | Wt 275.0 lb

## 2016-03-31 DIAGNOSIS — R1033 Periumbilical pain: Secondary | ICD-10-CM

## 2016-03-31 DIAGNOSIS — Z800 Family history of malignant neoplasm of digestive organs: Secondary | ICD-10-CM

## 2016-03-31 DIAGNOSIS — K625 Hemorrhage of anus and rectum: Secondary | ICD-10-CM

## 2016-03-31 DIAGNOSIS — R194 Change in bowel habit: Secondary | ICD-10-CM

## 2016-03-31 MED ORDER — NA SULFATE-K SULFATE-MG SULF 17.5-3.13-1.6 GM/177ML PO SOLN
1.0000 | Freq: Once | ORAL | 0 refills | Status: AC
Start: 2016-03-31 — End: 2016-03-31

## 2016-03-31 NOTE — Patient Instructions (Signed)
If you are age 57 or older, your body mass index should be between 23-30. Your Body mass index is 45.76 kg/m. If this is out of the aforementioned range listed, please consider follow up with your Primary Care Provider.  If you are age 11 or younger, your body mass index should be between 19-25. Your Body mass index is 45.76 kg/m. If this is out of the aformentioned range listed, please consider follow up with your Primary Care Provider.   We have sent the following medications to your pharmacy for you to pick up at your convenience: [place] have been scheduled for a colonoscopy. Please follow written instructions given to you at your visit today.  Please pick up your prep supplies at the pharmacy within the next 1-3 days. If you use inhalers (even only as needed), please bring them with you on the day of your procedure. Your physician has requested that you go to www.startemmi.com and enter the access code given to you at your visit today. This web site gives a general overview about your procedure. However, you should still follow specific instructions given to you by our office regarding your preparation for the procedure.  Thank you for choosing [facility]  Dr Wilfrid Lund III

## 2016-03-31 NOTE — Progress Notes (Signed)
[facility] Consult Note:  History: Audrey Duncan 03/31/2016  Referring physician: [name], DO  Reason for consult/chief complaint: Blood In Stools (pt reports alternating bowels habits between constipation and diarrhea; intermittent brb in stool, water, and on tissue;  also reports intermittent abdominal pain when she is constipated)   Subjective  HPI:  This is a 55 year old woman referred by primary care for abdominal pain and altered bowel habits. For many years, she has had a pattern of the mid and crampy mid abdominal pain where she might have the urgent need for a BM, and then feels better afterwards. Time she will have brief episodes of "rectal spasm" just after the BM. She alternates between constipation and diarrhea. On occasion there is some blood on the paper, the stool, or perhaps in the toilet bowl. Her last colonoscopy was with Dr. Amedeo Plenty of [facility] in 2006, and a polyp of unknown pathology was removed. Audrey Duncan's father was diagnosed with colon cancer at about age 68. Audrey Duncan last saw Dr. Sharlett Iles in May 2014 having much the same symptoms. A colonoscopy was recommended, but she was unable to schedule it due to some other medical and personal issues. ROS:  Review of Systems  Constitutional: Negative for appetite change and unexpected weight change.  HENT: Negative for mouth sores and voice change.   Eyes: Negative for pain and redness.  Respiratory: Negative for cough and shortness of breath.   Cardiovascular: Negative for chest pain and palpitations.  Genitourinary: Negative for dysuria and hematuria.  Musculoskeletal: Negative for arthralgias and myalgias.  Skin: Negative for pallor and rash.  Neurological: Negative for weakness and headaches.  Hematological: Negative for adenopathy.  Psychiatric/Behavioral: The patient is nervous/anxious.      Past Medical History: Past Medical History:  Diagnosis Date  . Asthma   . Chiari  malformation type II ([place])    diagnosed when in high school  . Colon polyp 2006   (TUBULAR ADENOMA)--COLONOSCOPY-DR. HAYES  . Ulcerative colitis ([place])   UC Dx unclear (childhood and college)   Past Surgical History: Past Surgical History:  Procedure Laterality Date  . ABDOMINAL HYSTERECTOMY  1999   complete  . [place]  . HERNIA REPAIR  2706   umbilical     Family History: Family History  Problem Relation Age of Onset  . Heart disease Mother 16    2 MIs   . Stroke Mother 88  . Hypertension Mother   . Cancer Mother     lung, lymphoma  . Lung cancer Mother     was a smoker  . COPD Father     smoker  . Cancer Father     colon, lung prostate  . Alcohol abuse Father   . Hypertension Father   . Asthma Sister   . Alcohol abuse Brother   . Diabetes Paternal Uncle     Social History: Social History   Social History  . Marital status: Married    Spouse name: N/A  . Number of children: N/A  . Years of education: N/A   Occupational History  . self employed--pest control    Social History Main Topics  . Smoking status: Never Smoker  . Smokeless tobacco: Never Used  . Alcohol use Yes     Comment: 1 glass of wine 1-2 x a month  . Drug use: No  . Sexual activity: Yes    Partners: Male   Other Topics Concern  . None  Social History Narrative  . None   She has a significant amount of stress helping to care for her elderly father with Alzheimer's  Allergies: No Known Allergies  Outpatient Meds: Current Outpatient Prescriptions  Medication Sig Dispense Refill  . ALPRAZolam (XANAX) 0.25 MG tablet Take 1 tablet (0.25 mg total) by mouth at bedtime as needed for anxiety. 30 tablet 0  . famotidine (PEPCID AC) 10 MG chewable tablet Chew 2 tablets (20 mg total) by mouth 2 (two) times daily.    . hydrochlorothiazide (HYDRODIURIL) 25 MG tablet Take 1 tablet (25 mg total) by mouth daily. 30 tablet 0  . mometasone-formoterol (DULERA) 200-5  MCG/ACT AERO Take 2 puffs first thing in am and then another 2 puffs about 12 hours later.  0  . Multiple Vitamins-Minerals (CENTRUM SILVER PO) Take by mouth every morning.    . potassium chloride SA (K-DUR,KLOR-CON) 20 MEQ tablet Take 1 tablet (20 mEq total) by mouth daily. 90 tablet 3  . Na Sulfate-K Sulfate-Mg Sulf 17.5-3.13-1.6 GM/180ML SOLN Take 1 kit by mouth once. 354 mL 0   No current facility-administered medications for this visit.       ___________________________________________________________________ Objective   Exam:  Ht _0  (1.651 m)   Wt 275 lb (124.7 kg)   BMI 45.76 kg/m    General: this is a(n) Morbidly obese middle-aged woman   Eyes: sclera anicteric, no redness  ENT: oral mucosa moist without lesions, no cervical or supraclavicular lymphadenopathy, good dentition  CV: RRR without murmur, S1/S2, no JVD, no peripheral edema  Resp: clear to auscultation bilaterally, normal RR and effort noted  GI: soft, no tenderness, with active bowel sounds. No guarding or palpable organomegaly noted.  Skin; warm and dry, no rash or jaundice noted  Neuro: awake, alert and oriented x 3. Normal gross motor function and fluent speech  Previous colonoscopy report reviewed.  Assessment: Encounter Diagnoses  Name Primary?  . Periumbilical abdominal pain Yes  . Altered bowel habits   . Rectal bleeding   . Family history of colon cancer     Her digestive symptoms sound like classic IBS with some associated anorectal bleeding. We discussed possible treatment for that including diet, stress reduction and medications. I think it would be difficult using antispasmodics and she also tends toward constipation frequent only. She is not really interested in medicines at this time. He was also supposed to have a stool test that sounds like a colo-guard. I have instructed her not to collect that ascended and since this is not the right setting to do that test.  Plan:  She needs  a colonoscopy for family history of colon cancer, and she is agreeable after discussion of the procedure and risks.  The benefits and risks of the planned procedure were described in detail with the patient or (when appropriate) their health care proxy.  Risks were outlined as including, but not limited to, bleeding, infection, perforation, adverse medication reaction leading to cardiac or pulmonary decompensation, or pancreatitis (if ERCP).  The limitation of incomplete mucosal visualization was also discussed.  No guarantees or warranties were given.  Thank you for the courtesy of this consult.  Please call me with any questions or concerns.  Nelida Meuse III  CC: [name], DO

## 2016-04-21 ENCOUNTER — Encounter: Payer: Self-pay | Admitting: Gastroenterology

## 2016-04-21 ENCOUNTER — Ambulatory Visit (AMBULATORY_SURGERY_CENTER): Payer: Self-pay | Admitting: Gastroenterology

## 2016-04-21 ENCOUNTER — Telehealth: Payer: Self-pay | Admitting: Gastroenterology

## 2016-04-21 VITALS — BP 108/59 | HR 75 | Temp 96.9°F | Resp 17 | Ht 65.0 in | Wt 275.0 lb

## 2016-04-21 DIAGNOSIS — Z800 Family history of malignant neoplasm of digestive organs: Secondary | ICD-10-CM

## 2016-04-21 DIAGNOSIS — Z1212 Encounter for screening for malignant neoplasm of rectum: Secondary | ICD-10-CM

## 2016-04-21 DIAGNOSIS — Z538 Procedure and treatment not carried out for other reasons: Secondary | ICD-10-CM

## 2016-04-21 DIAGNOSIS — Z1211 Encounter for screening for malignant neoplasm of colon: Secondary | ICD-10-CM

## 2016-04-21 MED ORDER — SODIUM CHLORIDE 0.9 % IV SOLN: 500.0000 mL | INTRAVENOUS | Status: DC

## 2016-04-21 NOTE — Patient Instructions (Signed)
YOU HAD AN ENDOSCOPIC PROCEDURE TODAY AT [facility]:   Refer to the procedure report that was given to you for any specific questions about what was found during the examination.  If the procedure report does not answer your questions, please call your gastroenterologist to clarify.  If you requested that your care partner not be given the details of your procedure findings, then the procedure report has been included in a sealed envelope for you to review at your convenience later.  YOU SHOULD EXPECT: Some feelings of bloating in the abdomen. Passage of more gas than usual.  Walking can help get rid of the air that was put into your GI tract during the procedure and reduce the bloating. If you had a lower endoscopy (such as a colonoscopy or flexible sigmoidoscopy) you may notice spotting of blood in your stool or on the toilet paper. If you underwent a bowel prep for your procedure, you may not have a normal bowel movement for a few days.  Please Note:  You might notice some irritation and congestion in your nose or some drainage.  This is from the oxygen used during your procedure.  There is no need for concern and it should clear up in a day or so.  SYMPTOMS TO REPORT IMMEDIATELY:   Following lower endoscopy (colonoscopy or flexible sigmoidoscopy):  Excessive amounts of blood in the stool  Significant tenderness or worsening of abdominal pains  Swelling of the abdomen that is new, acute  Fever of 100F or higher   Following upper endoscopy (EGD)  Vomiting of blood or coffee ground material  New chest pain or pain under the shoulder blades  Painful or persistently difficult swallowing  New shortness of breath  Fever of 100F or higher  Black, tarry-looking stools  For urgent or emergent issues, a gastroenterologist can be reached at any hour by calling [phone].   DIET:  We do recommend a small meal at first, but then you may proceed to your regular diet.  Drink  plenty of fluids but you should avoid alcoholic beverages for 24 hours.  ACTIVITY:  You should plan to take it easy for the rest of today and you should NOT DRIVE or use heavy machinery until tomorrow (because of the sedation medicines used during the test).    FOLLOW UP: Our staff will call the number listed on your records the next business day following your procedure to check on you and address any questions or concerns that you may have regarding the information given to you following your procedure. If we do not reach you, we will leave a message.  However, if you are feeling well and you are not experiencing any problems, there is no need to return our call.  We will assume that you have returned to your regular daily activities without incident.  If any biopsies were taken you will be contacted by phone or by letter within the next 1-3 weeks.  Please call us at [phone] if you have not heard about the biopsies in 3 weeks.    SIGNATURES/CONFIDENTIALITY: You and/or your care partner have signed paperwork which will be entered into your electronic medical record.  These signatures attest to the fact that that the information above on your After Visit Summary has been reviewed and is understood.  Full responsibility of the confidentiality of this discharge information lies with you and/or your care-partner.  Incomplete colonoscopy.  Diverticulosis information given.  Virtual colonoscopy to be  scheduled.

## 2016-04-21 NOTE — Telephone Encounter (Signed)
Audrey Duncan,    Please see sigmoidoscopy report from today, then work with her insurance to approve and schedule a CT colonography.  Indication: family history of colon cancer, incomplete screening colonoscopy.  Then contact patient - she is aware that this will be the plan.

## 2016-04-21 NOTE — Op Note (Signed)
[facility] Patient Name: Audrey Duncan Procedure Date: 04/21/2016 8:36 AM MRN: [id number] Endoscopist: [place]. Loletha Carrow , MD Age: 55 Referring MD:  Date of Birth: [birth year] Gender: Female Account #: [id number] Procedure:                Colonoscopy Indications:              Screening in patient at increased risk: Colorectal                            cancer in father 41 or older Medicines:                Monitored Anesthesia Care Procedure:                Pre-Anesthesia Assessment:                           - Prior to the procedure, a History and Physical                            was performed, and patient medications and                            allergies were reviewed. The patient's tolerance of                            previous anesthesia was also reviewed. The risks                            and benefits of the procedure and the sedation                            options and risks were discussed with the patient.                            All questions were answered, and informed consent                            was obtained. Prior Anticoagulants: The patient has                            taken no previous anticoagulant or antiplatelet                            agents. ASA Grade Assessment: II - A patient with                            mild systemic disease. After reviewing the risks                            and benefits, the patient was deemed in                            satisfactory condition to undergo the procedure.  After obtaining informed consent, the colonoscope                            was passed under direct vision. Throughout the                            procedure, the patient's blood pressure, pulse, and                            oxygen saturations were monitored continuously. The                            Model CF-HQ190L [id number]) scope was introduced                            through the anus and  advanced to the the splenic                            flexure. The colonoscopy was technically difficult                            and complex due to multiple diverticula in the                            colon. The patient tolerated the procedure well.                            The quality of the bowel preparation was good. No                            anatomical landmarks were photographed. The bowel                            preparation used was SUPREP. Scope In: 8:41:59 AM Scope Out: 9:00:19 AM Total Procedure Duration: 0 hours 18 minutes 20 seconds  Findings:                 The perianal and digital rectal examinations were                            normal.                           Many large-mouthed diverticula were found in the                            left colon.                           In the proximal descending colon there was a mild,                            concentric extrinsic compression with normal  mucosa. Just proximal to that at the splenic                            flexure, the diverticulosis was severe with marked                            tortuosity, precluding passage of both regular and                            pediatric colonoscope. Complications:            No immediate complications. Estimated Blood Loss:     Estimated blood loss: none. Impression:               - Diverticulosis in the left colon.                           - No specimens collected. Recommendation:           - Patient has a contact number available for                            emergencies. The signs and symptoms of potential                            delayed complications were discussed with the                            patient. Return to normal activities tomorrow.                            Written discharge instructions were provided to the                            patient.                           - Resume previous diet.                            - Continue present medications.                           - Perform a virtual colonoscopy (CT colonography)                            at the next available appointment. Emanuel Campos L. Loletha Carrow, MD 04/21/2016 9:08:58 AM This report has been signed electronically.

## 2016-04-24 ENCOUNTER — Other Ambulatory Visit: Payer: Self-pay

## 2016-04-24 ENCOUNTER — Telehealth: Payer: Self-pay

## 2016-04-24 ENCOUNTER — Other Ambulatory Visit: Payer: Self-pay | Admitting: Gastroenterology

## 2016-04-24 DIAGNOSIS — K5730 Diverticulosis of large intestine without perforation or abscess without bleeding: Secondary | ICD-10-CM

## 2016-04-24 DIAGNOSIS — R1033 Periumbilical pain: Secondary | ICD-10-CM

## 2016-04-24 DIAGNOSIS — Z800 Family history of malignant neoplasm of digestive organs: Secondary | ICD-10-CM

## 2016-04-24 DIAGNOSIS — K625 Hemorrhage of anus and rectum: Secondary | ICD-10-CM

## 2016-04-24 NOTE — Telephone Encounter (Signed)
  Follow up Call-  Call back number 04/21/2016  Post procedure Call Back phone  # [phone]  Permission to leave phone message Yes  Some recent data might be hidden     Patient questions:  Do you have a fever, pain , or abdominal swelling? No. Pain Score  0 *  Have you tolerated food without any problems? Yes.    Have you been able to return to your normal activities? Yes.    Do you have any questions about your discharge instructions: Diet   No. Medications  No. Follow up visit  No.  Do you have questions or concerns about your Care? No.  Actions: * If pain score is 4 or above: No action needed, pain <4.

## 2016-04-27 ENCOUNTER — Other Ambulatory Visit: Payer: Self-pay

## 2016-04-27 DIAGNOSIS — Q438 Other specified congenital malformations of intestine: Secondary | ICD-10-CM

## 2016-04-27 DIAGNOSIS — K5731 Diverticulosis of large intestine without perforation or abscess with bleeding: Secondary | ICD-10-CM

## 2016-04-27 DIAGNOSIS — Z800 Family history of malignant neoplasm of digestive organs: Secondary | ICD-10-CM

## 2016-04-27 DIAGNOSIS — K5730 Diverticulosis of large intestine without perforation or abscess without bleeding: Secondary | ICD-10-CM

## 2016-04-27 NOTE — Telephone Encounter (Signed)
This has already been ordered by Almyra Free.

## 2016-05-08 ENCOUNTER — Ambulatory Visit
Admission: RE | Admit: 2016-05-08 | Discharge: 2016-05-08 | Disposition: A | Discharge status: Home / Self Care | Payer: No Typology Code available for payment source | Source: Ambulatory Visit | Attending: Gastroenterology | Admitting: Gastroenterology

## 2016-05-08 DIAGNOSIS — K5730 Diverticulosis of large intestine without perforation or abscess without bleeding: Secondary | ICD-10-CM

## 2016-05-08 DIAGNOSIS — R1033 Periumbilical pain: Secondary | ICD-10-CM

## 2016-05-08 DIAGNOSIS — K625 Hemorrhage of anus and rectum: Secondary | ICD-10-CM

## 2016-05-08 DIAGNOSIS — Z800 Family history of malignant neoplasm of digestive organs: Secondary | ICD-10-CM

## 2016-05-10 ENCOUNTER — Telehealth: Payer: Self-pay | Admitting: Gastroenterology

## 2016-05-10 NOTE — Telephone Encounter (Signed)
Dr. Loletha Carrow patient calling for results of virtual colon.  Please review at your convenience.

## 2016-05-10 NOTE — Telephone Encounter (Signed)
Pt is calling back for her results [phone].

## 2016-05-12 ENCOUNTER — Ambulatory Visit (INDEPENDENT_AMBULATORY_CARE_PROVIDER_SITE_OTHER): Payer: Self-pay | Admitting: Family Medicine

## 2016-05-12 ENCOUNTER — Encounter: Payer: Self-pay | Admitting: Family Medicine

## 2016-05-12 VITALS — BP 108/82 | HR 108 | Temp 98.2°F | Resp 16 | Ht 65.0 in | Wt 277.2 lb

## 2016-05-12 DIAGNOSIS — Z1159 Encounter for screening for other viral diseases: Secondary | ICD-10-CM

## 2016-05-12 DIAGNOSIS — Z23 Encounter for immunization: Secondary | ICD-10-CM

## 2016-05-12 DIAGNOSIS — I10 Essential (primary) hypertension: Secondary | ICD-10-CM

## 2016-05-12 DIAGNOSIS — Z0000 Encounter for general adult medical examination without abnormal findings: Secondary | ICD-10-CM

## 2016-05-12 LAB — POCT URINALYSIS DIPSTICK
BILIRUBIN UA: NEGATIVE
GLUCOSE UA: NEGATIVE
KETONES UA: NEGATIVE
Leukocytes, UA: NEGATIVE
NITRITE UA: NEGATIVE
PH UA: 6
RBC UA: NEGATIVE
Urobilinogen, UA: 0.2

## 2016-05-12 LAB — CBC WITH DIFFERENTIAL/PLATELET
BASOS PCT: 1 %
Basophils Absolute: 87 cells/uL (ref 0–200)
EOS ABS: 261 {cells}/uL (ref 15–500)
Eosinophils Relative: 3 %
HCT: 43 % (ref 35.0–45.0)
Hemoglobin: 14.4 g/dL (ref 11.7–15.5)
LYMPHS PCT: 27 %
Lymphs Abs: 2349 cells/uL (ref 850–3900)
MCH: 30.7 pg (ref 27.0–33.0)
MCHC: 33.5 g/dL (ref 32.0–36.0)
MCV: 91.7 fL (ref 80.0–100.0)
MONOS PCT: 8 %
MPV: 10.9 fL (ref 7.5–12.5)
Monocytes Absolute: 696 cells/uL (ref 200–950)
NEUTROS PCT: 61 %
Neutro Abs: 5307 cells/uL (ref 1500–7800)
PLATELETS: 316 10*3/uL (ref 140–400)
RBC: 4.69 MIL/uL (ref 3.80–5.10)
RDW: 13.2 % (ref 11.0–15.0)
WBC: 8.7 10*3/uL (ref 3.8–10.8)

## 2016-05-12 LAB — TSH: TSH: 2.62 mIU/L

## 2016-05-12 NOTE — Progress Notes (Signed)
Subjective:     Audrey Duncan is a 55 y.o. female and is here for a comprehensive physical exam. The patient reports no problems.  Social History   Social History  . Marital status: Married    Spouse name: N/A  . Number of children: N/A  . Years of education: N/A   Occupational History  . self employed--pest control    Social History Main Topics  . Smoking status: Never Smoker  . Smokeless tobacco: Never Used  . Alcohol use Yes     Comment: 1 glass of wine 1-2 x a month  . Drug use: No  . Sexual activity: Yes    Partners: Male   Other Topics Concern  . Not on file   Social History Narrative  . No narrative on file   Health Maintenance  Topic Date Due  . Hepatitis C Screening  September 17, 1960  . HIV Screening  01/02/1974  . INFLUENZA VACCINE  02/08/2016  . PAP SMEAR  03/13/2017  . MAMMOGRAM  10/13/2017  . TETANUS/TDAP  03/28/2020  . COLONOSCOPY  05/08/2026    The following portions of the patient's history were reviewed and updated as appropriate:  She  has a past medical history of Asthma; Chiari malformation type II ([place]); Colon polyp (2006); and Ulcerative colitis ([place]). She  does not have any pertinent problems on file. She  has a past surgical history that includes Hernia repair (2004); Abdominal hysterectomy (1999); and Cesarean section (1995 and 1998). Her family history includes Alcohol abuse in her brother and father; Asthma in her sister; COPD in her father; Cancer in her father and mother; Diabetes in her paternal uncle; Heart disease (age of onset: 74) in her mother; Hypertension in her father and mother; Lung cancer in her mother; Stroke (age of onset: 110) in her mother. She  reports that she has never smoked. She has never used smokeless tobacco. She reports that she drinks alcohol. She reports that she does not use drugs. She has a current medication list which includes the following prescription(s): alprazolam, famotidine, hydrochlorothiazide,  mometasone-formoterol, multiple vitamins-minerals, and potassium chloride sa, and the following Facility-Administered Medications: sodium chloride. Current Outpatient Prescriptions on File Prior to Visit  Medication Sig Dispense Refill  . ALPRAZolam (XANAX) 0.25 MG tablet Take 1 tablet (0.25 mg total) by mouth at bedtime as needed for anxiety. 30 tablet 0  . famotidine (PEPCID AC) 10 MG chewable tablet Chew 2 tablets (20 mg total) by mouth 2 (two) times daily.    . hydrochlorothiazide (HYDRODIURIL) 25 MG tablet Take 1 tablet (25 mg total) by mouth daily. 30 tablet 0  . mometasone-formoterol (DULERA) 200-5 MCG/ACT AERO Take 2 puffs first thing in am and then another 2 puffs about 12 hours later.  0  . Multiple Vitamins-Minerals (CENTRUM SILVER PO) Take by mouth every morning.    . potassium chloride SA (K-DUR,KLOR-CON) 20 MEQ tablet Take 1 tablet (20 mEq total) by mouth daily. 90 tablet 3   Current Facility-Administered Medications on File Prior to Visit  Medication Dose Route Frequency Provider Last Rate Last Dose  . 0.9 %  sodium chloride infusion  500 mL Intravenous Continuous Nelida Meuse III, MD       She has No Known Allergies..  Review of Systems Review of Systems  Constitutional: Negative for activity change, appetite change and fatigue.  HENT: Negative for hearing loss, congestion, tinnitus and ear discharge.  dentist q53m Eyes: Negative for visual disturbance (see optho q1y -- vision corrected  to 20/20 with glasses).  Respiratory: Negative for cough, chest tightness and shortness of breath.   Cardiovascular: Negative for chest pain, palpitations and leg swelling.  Gastrointestinal: Negative for abdominal pain, diarrhea, constipation and abdominal distention.  Genitourinary: Negative for urgency, frequency, decreased urine volume and difficulty urinating.  Musculoskeletal: Negative for back pain, arthralgias and gait problem.  Skin: Negative for color change, pallor and rash.   Neurological: Negative for dizziness, light-headedness, numbness and headaches.  Hematological: Negative for adenopathy. Does not bruise/bleed easily.  Psychiatric/Behavioral: Negative for suicidal ideas, confusion, sleep disturbance, self-injury, dysphoric mood, decreased concentration and agitation.       Objective:    BP 108/82 (BP Location: Left Arm, Patient Position: Sitting, Cuff Size: Large)   Pulse (!) 108   Temp 98.2 F (36.8 C) (Oral)   Resp 16   Ht 5\' 5"  (1.651 m)   Wt 277 lb 3.2 oz (125.7 kg)   SpO2 93%   BMI 46.13 kg/m  General appearance: alert, cooperative, appears stated age and no distress Head: Normocephalic, without obvious abnormality, atraumatic Eyes: conjunctivae/corneas clear. PERRL, EOM's intact. Fundi benign. Ears: normal TM's and external ear canals both ears Nose: Nares normal. Septum midline. Mucosa normal. No drainage or sinus tenderness. Throat: lips, mucosa, and tongue normal; teeth and gums normal Neck: no adenopathy, no carotid bruit, no JVD, supple, symmetrical, trachea midline and thyroid not enlarged, symmetric, no tenderness/mass/nodules Back: symmetric, no curvature. ROM normal. No CVA tenderness. Lungs: clear to auscultation bilaterally Breasts: gyn Heart: regular rate and rhythm, S1, S2 normal, no murmur, click, rub or gallop Abdomen: soft, non-tender; bowel sounds normal; no masses,  no organomegaly Pelvic: deferred--gyn Extremities: extremities normal, atraumatic, no cyanosis or edema Pulses: 2+ and symmetric Skin: Skin color, texture, turgor normal. No rashes or lesions Lymph nodes: Cervical, supraclavicular, and axillary nodes normal. Neurologic: Alert and oriented X 3, normal strength and tone. Normal symmetric reflexes. Normal coordination and gait    Assessment:    Healthy female exam.      Plan:     ghm utd checkl labs See After Visit Summary for Counseling Recommendations    1. Preventative health care See above -  Comprehensive metabolic panel - Lipid panel - CBC with Differential/Platelet - POCT urinalysis dipstick - TSH  2. Essential hypertension Stable,  hctz - Comprehensive metabolic panel - Lipid panel - CBC with Differential/Platelet - POCT urinalysis dipstick - TSH  3. Need for hepatitis C screening test   - Hepatitis C antibody  4. Encounter for immunization   - Flu Vaccine QUAD 36+ mos IM

## 2016-05-12 NOTE — Progress Notes (Signed)
Pre visit review using our clinic review tool, if applicable. No additional management support is needed unless otherwise documented below in the visit note. 

## 2016-05-12 NOTE — Patient Instructions (Signed)
Preventive Care for Adults, Female A healthy lifestyle and preventive care can promote health and wellness. Preventive health guidelines for women include the following key practices.  A routine yearly physical is a good way to check with your health care provider about your health and preventive screening. It is a chance to share any concerns and updates on your health and to receive a thorough exam.  Visit your dentist for a routine exam and preventive care every 6 months. Brush your teeth twice a day and floss once a day. Good oral hygiene prevents tooth decay and gum disease.  The frequency of eye exams is based on your age, health, family medical history, use of contact lenses, and other factors. Follow your health care provider's recommendations for frequency of eye exams.  Eat a healthy diet. Foods like vegetables, fruits, whole grains, low-fat dairy products, and lean protein foods contain the nutrients you need without too many calories. Decrease your intake of foods high in solid fats, added sugars, and salt. Eat the right amount of calories for you.Get information about a proper diet from your health care provider, if necessary.  Regular physical exercise is one of the most important things you can do for your health. Most adults should get at least 150 minutes of moderate-intensity exercise (any activity that increases your heart rate and causes you to sweat) each week. In addition, most adults need muscle-strengthening exercises on 2 or more days a week.  Maintain a healthy weight. The body mass index (BMI) is a screening tool to identify possible weight problems. It provides an estimate of body fat based on height and weight. Your health care provider can find your BMI and can help you achieve or maintain a healthy weight.For adults 20 years and older:  A BMI below 18.5 is considered underweight.  A BMI of 18.5 to 24.9 is normal.  A BMI of 25 to 29.9 is considered overweight.  A  BMI of 30 and above is considered obese.  Maintain normal blood lipids and cholesterol levels by exercising and minimizing your intake of saturated fat. Eat a balanced diet with plenty of fruit and vegetables. Blood tests for lipids and cholesterol should begin at age 45 and be repeated every 5 years. If your lipid or cholesterol levels are high, you are over 50, or you are at high risk for heart disease, you may need your cholesterol levels checked more frequently.Ongoing high lipid and cholesterol levels should be treated with medicines if diet and exercise are not working.  If you smoke, find out from your health care provider how to quit. If you do not use tobacco, do not start.  Lung cancer screening is recommended for adults aged 45-80 years who are at high risk for developing lung cancer because of a history of smoking. A yearly low-dose CT scan of the lungs is recommended for people who have at least a 30-pack-year history of smoking and are a current smoker or have quit within the past 15 years. A pack year of smoking is smoking an average of 1 pack of cigarettes a day for 1 year (for example: 1 pack a day for 30 years or 2 packs a day for 15 years). Yearly screening should continue until the smoker has stopped smoking for at least 15 years. Yearly screening should be stopped for people who develop a health problem that would prevent them from having lung cancer treatment.  If you are pregnant, do not drink alcohol. If you are  breastfeeding, be very cautious about drinking alcohol. If you are not pregnant and choose to drink alcohol, do not have more than 1 drink per day. One drink is considered to be 12 ounces (355 mL) of beer, 5 ounces (148 mL) of wine, or 1.5 ounces (44 mL) of liquor.  Avoid use of street drugs. Do not share needles with anyone. Ask for help if you need support or instructions about stopping the use of drugs.  High blood pressure causes heart disease and increases the risk  of stroke. Your blood pressure should be checked at least every 1 to 2 years. Ongoing high blood pressure should be treated with medicines if weight loss and exercise do not work.  If you are 55-79 years old, ask your health care provider if you should take aspirin to prevent strokes.  Diabetes screening is done by taking a blood sample to check your blood glucose level after you have not eaten for a certain period of time (fasting). If you are not overweight and you do not have risk factors for diabetes, you should be screened once every 3 years starting at age 45. If you are overweight or obese and you are 40-70 years of age, you should be screened for diabetes every year as part of your cardiovascular risk assessment.  Breast cancer screening is essential preventive care for women. You should practice "breast self-awareness." This means understanding the normal appearance and feel of your breasts and may include breast self-examination. Any changes detected, no matter how small, should be reported to a health care provider. Women in their 20s and 30s should have a clinical breast exam (CBE) by a health care provider as part of a regular health exam every 1 to 3 years. After age 40, women should have a CBE every year. Starting at age 40, women should consider having a mammogram (breast X-ray test) every year. Women who have a family history of breast cancer should talk to their health care provider about genetic screening. Women at a high risk of breast cancer should talk to their health care providers about having an MRI and a mammogram every year.  Breast cancer gene (BRCA)-related cancer risk assessment is recommended for women who have family members with BRCA-related cancers. BRCA-related cancers include breast, ovarian, tubal, and peritoneal cancers. Having family members with these cancers may be associated with an increased risk for harmful changes (mutations) in the breast cancer genes BRCA1 and  BRCA2. Results of the assessment will determine the need for genetic counseling and BRCA1 and BRCA2 testing.  Your health care provider may recommend that you be screened regularly for cancer of the pelvic organs (ovaries, uterus, and vagina). This screening involves a pelvic examination, including checking for microscopic changes to the surface of your cervix (Pap test). You may be encouraged to have this screening done every 3 years, beginning at age 21.  For women ages 30-65, health care providers may recommend pelvic exams and Pap testing every 3 years, or they may recommend the Pap and pelvic exam, combined with testing for human papilloma virus (HPV), every 5 years. Some types of HPV increase your risk of cervical cancer. Testing for HPV may also be done on women of any age with unclear Pap test results.  Other health care providers may not recommend any screening for nonpregnant women who are considered low risk for pelvic cancer and who do not have symptoms. Ask your health care provider if a screening pelvic exam is right for   you.  If you have had past treatment for cervical cancer or a condition that could lead to cancer, you need Pap tests and screening for cancer for at least 20 years after your treatment. If Pap tests have been discontinued, your risk factors (such as having a new sexual partner) need to be reassessed to determine if screening should resume. Some women have medical problems that increase the chance of getting cervical cancer. In these cases, your health care provider may recommend more frequent screening and Pap tests.  Colorectal cancer can be detected and often prevented. Most routine colorectal cancer screening begins at the age of 50 years and continues through age 75 years. However, your health care provider may recommend screening at an earlier age if you have risk factors for colon cancer. On a yearly basis, your health care provider may provide home test kits to check  for hidden blood in the stool. Use of a small camera at the end of a tube, to directly examine the colon (sigmoidoscopy or colonoscopy), can detect the earliest forms of colorectal cancer. Talk to your health care provider about this at age 50, when routine screening begins. Direct exam of the colon should be repeated every 5-10 years through age 75 years, unless early forms of precancerous polyps or small growths are found.  People who are at an increased risk for hepatitis B should be screened for this virus. You are considered at high risk for hepatitis B if:  You were born in a country where hepatitis B occurs often. Talk with your health care provider about which countries are considered high risk.  Your parents were born in a high-risk country and you have not received a shot to protect against hepatitis B (hepatitis B vaccine).  You have HIV or AIDS.  You use needles to inject street drugs.  You live with, or have sex with, someone who has hepatitis B.  You get hemodialysis treatment.  You take certain medicines for conditions like cancer, organ transplantation, and autoimmune conditions.  Hepatitis C blood testing is recommended for all people born from 1945 through 1965 and any individual with known risks for hepatitis C.  Practice safe sex. Use condoms and avoid high-risk sexual practices to reduce the spread of sexually transmitted infections (STIs). STIs include gonorrhea, chlamydia, syphilis, trichomonas, herpes, HPV, and human immunodeficiency virus (HIV). Herpes, HIV, and HPV are viral illnesses that have no cure. They can result in disability, cancer, and death.  You should be screened for sexually transmitted illnesses (STIs) including gonorrhea and chlamydia if:  You are sexually active and are younger than 24 years.  You are older than 24 years and your health care provider tells you that you are at risk for this type of infection.  Your sexual activity has changed  since you were last screened and you are at an increased risk for chlamydia or gonorrhea. Ask your health care provider if you are at risk.  If you are at risk of being infected with HIV, it is recommended that you take a prescription medicine daily to prevent HIV infection. This is called preexposure prophylaxis (PrEP). You are considered at risk if:  You are sexually active and do not regularly use condoms or know the HIV status of your partner(s).  You take drugs by injection.  You are sexually active with a partner who has HIV.  Talk with your health care provider about whether you are at high risk of being infected with HIV. If   you choose to begin PrEP, you should first be tested for HIV. You should then be tested every 3 months for as long as you are taking PrEP.  Osteoporosis is a disease in which the bones lose minerals and strength with aging. This can result in serious bone fractures or breaks. The risk of osteoporosis can be identified using a bone density scan. Women ages 67 years and over and women at risk for fractures or osteoporosis should discuss screening with their health care providers. Ask your health care provider whether you should take a calcium supplement or vitamin D to reduce the rate of osteoporosis.  Menopause can be associated with physical symptoms and risks. Hormone replacement therapy is available to decrease symptoms and risks. You should talk to your health care provider about whether hormone replacement therapy is right for you.  Use sunscreen. Apply sunscreen liberally and repeatedly throughout the day. You should seek shade when your shadow is shorter than you. Protect yourself by wearing long sleeves, pants, a wide-brimmed hat, and sunglasses year round, whenever you are outdoors.  Once a month, do a whole body skin exam, using a mirror to look at the skin on your back. Tell your health care provider of new moles, moles that have irregular borders, moles that  are larger than a pencil eraser, or moles that have changed in shape or color.  Stay current with required vaccines (immunizations).  Influenza vaccine. All adults should be immunized every year.  Tetanus, diphtheria, and acellular pertussis (Td, Tdap) vaccine. Pregnant women should receive 1 dose of Tdap vaccine during each pregnancy. The dose should be obtained regardless of the length of time since the last dose. Immunization is preferred during the 27th-36th week of gestation. An adult who has not previously received Tdap or who does not know her vaccine status should receive 1 dose of Tdap. This initial dose should be followed by tetanus and diphtheria toxoids (Td) booster doses every 10 years. Adults with an unknown or incomplete history of completing a 3-dose immunization series with Td-containing vaccines should begin or complete a primary immunization series including a Tdap dose. Adults should receive a Td booster every 10 years.  Varicella vaccine. An adult without evidence of immunity to varicella should receive 2 doses or a second dose if she has previously received 1 dose. Pregnant females who do not have evidence of immunity should receive the first dose after pregnancy. This first dose should be obtained before leaving the health care facility. The second dose should be obtained 4-8 weeks after the first dose.  Human papillomavirus (HPV) vaccine. Females aged 13-26 years who have not received the vaccine previously should obtain the 3-dose series. The vaccine is not recommended for use in pregnant females. However, pregnancy testing is not needed before receiving a dose. If a female is found to be pregnant after receiving a dose, no treatment is needed. In that case, the remaining doses should be delayed until after the pregnancy. Immunization is recommended for any person with an immunocompromised condition through the age of 61 years if she did not get any or all doses earlier. During the  3-dose series, the second dose should be obtained 4-8 weeks after the first dose. The third dose should be obtained 24 weeks after the first dose and 16 weeks after the second dose.  Zoster vaccine. One dose is recommended for adults aged 30 years or older unless certain conditions are present.  Measles, mumps, and rubella (MMR) vaccine. Adults born  before 1957 generally are considered immune to measles and mumps. Adults born in 1957 or later should have 1 or more doses of MMR vaccine unless there is a contraindication to the vaccine or there is laboratory evidence of immunity to each of the three diseases. A routine second dose of MMR vaccine should be obtained at least 28 days after the first dose for students attending postsecondary schools, health care workers, or international travelers. People who received inactivated measles vaccine or an unknown type of measles vaccine during 1963-1967 should receive 2 doses of MMR vaccine. People who received inactivated mumps vaccine or an unknown type of mumps vaccine before 1979 and are at high risk for mumps infection should consider immunization with 2 doses of MMR vaccine. For females of childbearing age, rubella immunity should be determined. If there is no evidence of immunity, females who are not pregnant should be vaccinated. If there is no evidence of immunity, females who are pregnant should delay immunization until after pregnancy. Unvaccinated health care workers born before 1957 who lack laboratory evidence of measles, mumps, or rubella immunity or laboratory confirmation of disease should consider measles and mumps immunization with 2 doses of MMR vaccine or rubella immunization with 1 dose of MMR vaccine.  Pneumococcal 13-valent conjugate (PCV13) vaccine. When indicated, a person who is uncertain of his immunization history and has no record of immunization should receive the PCV13 vaccine. All adults 65 years of age and older should receive this  vaccine. An adult aged 19 years or older who has certain medical conditions and has not been previously immunized should receive 1 dose of PCV13 vaccine. This PCV13 should be followed with a dose of pneumococcal polysaccharide (PPSV23) vaccine. Adults who are at high risk for pneumococcal disease should obtain the PPSV23 vaccine at least 8 weeks after the dose of PCV13 vaccine. Adults older than 57 years of age who have normal immune system function should obtain the PPSV23 vaccine dose at least 1 year after the dose of PCV13 vaccine.  Pneumococcal polysaccharide (PPSV23) vaccine. When PCV13 is also indicated, PCV13 should be obtained first. All adults aged 65 years and older should be immunized. An adult younger than age 65 years who has certain medical conditions should be immunized. Any person who resides in a nursing home or long-term care facility should be immunized. An adult smoker should be immunized. People with an immunocompromised condition and certain other conditions should receive both PCV13 and PPSV23 vaccines. People with human immunodeficiency virus (HIV) infection should be immunized as soon as possible after diagnosis. Immunization during chemotherapy or radiation therapy should be avoided. Routine use of PPSV23 vaccine is not recommended for American Indians, [place] Natives, or people younger than 65 years unless there are medical conditions that require PPSV23 vaccine. When indicated, people who have unknown immunization and have no record of immunization should receive PPSV23 vaccine. One-time revaccination 5 years after the first dose of PPSV23 is recommended for people aged 19-64 years who have chronic kidney failure, nephrotic syndrome, asplenia, or immunocompromised conditions. People who received 1-2 doses of PPSV23 before age 65 years should receive another dose of PPSV23 vaccine at age 65 years or later if at least 5 years have passed since the previous dose. Doses of PPSV23 are not  needed for people immunized with PPSV23 at or after age 65 years.  Meningococcal vaccine. Adults with asplenia or persistent complement component deficiencies should receive 2 doses of quadrivalent meningococcal conjugate (MenACWY-D) vaccine. The doses should be obtained   at least 2 months apart. Microbiologists working with certain meningococcal bacteria, [place] recruits, people at risk during an outbreak, and people who travel to or live in countries with a high rate of meningitis should be immunized. A first-year college student up through age 34 years who is living in a residence hall should receive a dose if she did not receive a dose on or after her 16th birthday. Adults who have certain high-risk conditions should receive one or more doses of vaccine.  Hepatitis A vaccine. Adults who wish to be protected from this disease, have certain high-risk conditions, work with hepatitis A-infected animals, work in hepatitis A research labs, or travel to or work in countries with a high rate of hepatitis A should be immunized. Adults who were previously unvaccinated and who anticipate close contact with an international adoptee during the first 60 days after arrival in the [place] from a country with a high rate of hepatitis A should be immunized.  Hepatitis B vaccine. Adults who wish to be protected from this disease, have certain high-risk conditions, may be exposed to blood or other infectious body fluids, are household contacts or sex partners of hepatitis B positive people, are clients or workers in certain care facilities, or travel to or work in countries with a high rate of hepatitis B should be immunized.  Haemophilus influenzae type b (Hib) vaccine. A previously unvaccinated person with asplenia or sickle cell disease or having a scheduled splenectomy should receive 1 dose of Hib vaccine. Regardless of previous immunization, a recipient of a hematopoietic stem cell transplant should receive a  3-dose series 6-12 months after her successful transplant. Hib vaccine is not recommended for adults with HIV infection. Preventive Services / Frequency Ages 35 to 4 years  Blood pressure check.** / Every 3-5 years.  Lipid and cholesterol check.** / Every 5 years beginning at age 60.  Clinical breast exam.** / Every 3 years for women in their 71s and 10s.  BRCA-related cancer risk assessment.** / For women who have family members with a BRCA-related cancer (breast, ovarian, tubal, or peritoneal cancers).  Pap test.** / Every 2 years from ages 76 through 26. Every 3 years starting at age 61 through age 76 or 93 with a history of 3 consecutive normal Pap tests.  HPV screening.** / Every 3 years from ages 37 through ages 60 to 51 with a history of 3 consecutive normal Pap tests.  Hepatitis C blood test.** / For any individual with known risks for hepatitis C.  Skin self-exam. / Monthly.  Influenza vaccine. / Every year.  Tetanus, diphtheria, and acellular pertussis (Tdap, Td) vaccine.** / Consult your health care provider. Pregnant women should receive 1 dose of Tdap vaccine during each pregnancy. 1 dose of Td every 10 years.  Varicella vaccine.** / Consult your health care provider. Pregnant females who do not have evidence of immunity should receive the first dose after pregnancy.  HPV vaccine. / 3 doses over 6 months, if 93 and younger. The vaccine is not recommended for use in pregnant females. However, pregnancy testing is not needed before receiving a dose.  Measles, mumps, rubella (MMR) vaccine.** / You need at least 1 dose of MMR if you were born in 1957 or later. You may also need a 2nd dose. For females of childbearing age, rubella immunity should be determined. If there is no evidence of immunity, females who are not pregnant should be vaccinated. If there is no evidence of immunity, females who are  pregnant should delay immunization until after pregnancy.  Pneumococcal  13-valent conjugate (PCV13) vaccine.** / Consult your health care provider.  Pneumococcal polysaccharide (PPSV23) vaccine.** / 1 to 2 doses if you smoke cigarettes or if you have certain conditions.  Meningococcal vaccine.** / 1 dose if you are age 68 to 8 years and a Market researcher living in a residence hall, or have one of several medical conditions, you need to get vaccinated against meningococcal disease. You may also need additional booster doses.  Hepatitis A vaccine.** / Consult your health care provider.  Hepatitis B vaccine.** / Consult your health care provider.  Haemophilus influenzae type b (Hib) vaccine.** / Consult your health care provider. Ages 7 to 53 years  Blood pressure check.** / Every year.  Lipid and cholesterol check.** / Every 5 years beginning at age 25 years.  Lung cancer screening. / Every year if you are aged 11-80 years and have a 30-pack-year history of smoking and currently smoke or have quit within the past 15 years. Yearly screening is stopped once you have quit smoking for at least 15 years or develop a health problem that would prevent you from having lung cancer treatment.  Clinical breast exam.** / Every year after age 48 years.  BRCA-related cancer risk assessment.** / For women who have family members with a BRCA-related cancer (breast, ovarian, tubal, or peritoneal cancers).  Mammogram.** / Every year beginning at age 41 years and continuing for as long as you are in good health. Consult with your health care provider.  Pap test.** / Every 3 years starting at age 65 years through age 37 or 70 years with a history of 3 consecutive normal Pap tests.  HPV screening.** / Every 3 years from ages 72 years through ages 60 to 40 years with a history of 3 consecutive normal Pap tests.  Fecal occult blood test (FOBT) of stool. / Every year beginning at age 21 years and continuing until age 5 years. You may not need to do this test if you get  a colonoscopy every 10 years.  Flexible sigmoidoscopy or colonoscopy.** / Every 5 years for a flexible sigmoidoscopy or every 10 years for a colonoscopy beginning at age 35 years and continuing until age 48 years.  Hepatitis C blood test.** / For all people born from [birth year] through 1965 and any individual with known risks for hepatitis C.  Skin self-exam. / Monthly.  Influenza vaccine. / Every year.  Tetanus, diphtheria, and acellular pertussis (Tdap/Td) vaccine.** / Consult your health care provider. Pregnant women should receive 1 dose of Tdap vaccine during each pregnancy. 1 dose of Td every 10 years.  Varicella vaccine.** / Consult your health care provider. Pregnant females who do not have evidence of immunity should receive the first dose after pregnancy.  Zoster vaccine.** / 1 dose for adults aged 30 years or older.  Measles, mumps, rubella (MMR) vaccine.** / You need at least 1 dose of MMR if you were born in 1957 or later. You may also need a second dose. For females of childbearing age, rubella immunity should be determined. If there is no evidence of immunity, females who are not pregnant should be vaccinated. If there is no evidence of immunity, females who are pregnant should delay immunization until after pregnancy.  Pneumococcal 13-valent conjugate (PCV13) vaccine.** / Consult your health care provider.  Pneumococcal polysaccharide (PPSV23) vaccine.** / 1 to 2 doses if you smoke cigarettes or if you have certain conditions.  Meningococcal vaccine.** /  Consult your health care provider.  Hepatitis A vaccine.** / Consult your health care provider.  Hepatitis B vaccine.** / Consult your health care provider.  Haemophilus influenzae type b (Hib) vaccine.** / Consult your health care provider. Ages 64 years and over  Blood pressure check.** / Every year.  Lipid and cholesterol check.** / Every 5 years beginning at age 23 years.  Lung cancer screening. / Every year if you  are aged 16-80 years and have a 30-pack-year history of smoking and currently smoke or have quit within the past 15 years. Yearly screening is stopped once you have quit smoking for at least 15 years or develop a health problem that would prevent you from having lung cancer treatment.  Clinical breast exam.** / Every year after age 74 years.  BRCA-related cancer risk assessment.** / For women who have family members with a BRCA-related cancer (breast, ovarian, tubal, or peritoneal cancers).  Mammogram.** / Every year beginning at age 44 years and continuing for as long as you are in good health. Consult with your health care provider.  Pap test.** / Every 3 years starting at age 58 years through age 22 or 39 years with 3 consecutive normal Pap tests. Testing can be stopped between 65 and 70 years with 3 consecutive normal Pap tests and no abnormal Pap or HPV tests in the past 10 years.  HPV screening.** / Every 3 years from ages 64 years through ages 70 or 61 years with a history of 3 consecutive normal Pap tests. Testing can be stopped between 65 and 70 years with 3 consecutive normal Pap tests and no abnormal Pap or HPV tests in the past 10 years.  Fecal occult blood test (FOBT) of stool. / Every year beginning at age 40 years and continuing until age 27 years. You may not need to do this test if you get a colonoscopy every 10 years.  Flexible sigmoidoscopy or colonoscopy.** / Every 5 years for a flexible sigmoidoscopy or every 10 years for a colonoscopy beginning at age 7 years and continuing until age 32 years.  Hepatitis C blood test.** / For all people born from [birth year] through 1967 and any individual with known risks for hepatitis C.  Osteoporosis screening.** / A one-time screening for women ages 30 years and over and women at risk for fractures or osteoporosis.  Skin self-exam. / Monthly.  Influenza vaccine. / Every year.  Tetanus, diphtheria, and acellular pertussis (Tdap/Td)  vaccine.** / 1 dose of Td every 10 years.  Varicella vaccine.** / Consult your health care provider.  Zoster vaccine.** / 1 dose for adults aged 35 years or older.  Pneumococcal 13-valent conjugate (PCV13) vaccine.** / Consult your health care provider.  Pneumococcal polysaccharide (PPSV23) vaccine.** / 1 dose for all adults aged 46 years and older.  Meningococcal vaccine.** / Consult your health care provider.  Hepatitis A vaccine.** / Consult your health care provider.  Hepatitis B vaccine.** / Consult your health care provider.  Haemophilus influenzae type b (Hib) vaccine.** / Consult your health care provider. ** Family history and personal history of risk and conditions may change your health care provider's recommendations.   This information is not intended to replace advice given to you by your health care provider. Make sure you discuss any questions you have with your health care provider.   Document Released: 08/22/2001 Document Revised: 07/17/2014 Document Reviewed: 11/21/2010 Elsevier Interactive Patient Education [employer].

## 2016-05-13 LAB — COMPREHENSIVE METABOLIC PANEL
ALT: 30 U/L — ABNORMAL HIGH (ref 6–29)
AST: 26 U/L (ref 10–35)
Albumin: 4.3 g/dL (ref 3.6–5.1)
Alkaline Phosphatase: 95 U/L (ref 33–130)
BUN: 20 mg/dL (ref 7–25)
CHLORIDE: 101 mmol/L (ref 98–110)
CO2: 25 mmol/L (ref 20–31)
Calcium: 9.5 mg/dL (ref 8.6–10.4)
Creat: 0.96 mg/dL (ref 0.50–1.05)
GLUCOSE: 90 mg/dL (ref 65–99)
POTASSIUM: 3.7 mmol/L (ref 3.5–5.3)
Sodium: 144 mmol/L (ref 135–146)
Total Bilirubin: 0.7 mg/dL (ref 0.2–1.2)
Total Protein: 7.2 g/dL (ref 6.1–8.1)

## 2016-05-13 LAB — HEPATITIS C ANTIBODY: HCV AB: NEGATIVE

## 2016-05-13 LAB — LIPID PANEL
CHOL/HDL RATIO: 4.3 ratio (ref <=5.0)
CHOLESTEROL: 163 mg/dL (ref 125–200)
HDL: 38 mg/dL — AB (ref >=46)
LDL Cholesterol: 78 mg/dL (ref <130)
Triglycerides: 235 mg/dL — ABNORMAL HIGH (ref <150)
VLDL: 47 mg/dL — AB (ref <30)

## 2016-05-25 ENCOUNTER — Other Ambulatory Visit: Payer: Self-pay | Admitting: Family Medicine

## 2016-08-06 ENCOUNTER — Other Ambulatory Visit: Payer: Self-pay | Admitting: Family Medicine

## 2016-08-06 DIAGNOSIS — G4700 Insomnia, unspecified: Secondary | ICD-10-CM

## 2016-08-08 NOTE — Telephone Encounter (Signed)
Faxed hardcopy for Alprazolam to [facility] in [place] on [address].

## 2016-08-08 NOTE — Telephone Encounter (Signed)
Last  Office visit 05/12/2016 Last refill #30 with 0 refills on 03/28/2016 No UDS/No Contract

## 2016-11-29 ENCOUNTER — Other Ambulatory Visit: Payer: Self-pay | Admitting: Family Medicine

## 2017-03-16 ENCOUNTER — Other Ambulatory Visit: Payer: Self-pay | Admitting: Family Medicine

## 2017-03-16 DIAGNOSIS — Z1231 Encounter for screening mammogram for malignant neoplasm of breast: Secondary | ICD-10-CM

## 2017-04-02 ENCOUNTER — Ambulatory Visit
Admission: RE | Admit: 2017-04-02 | Discharge: 2017-04-02 | Disposition: A | Discharge status: Home / Self Care | Payer: Managed Care, Other (non HMO) | Source: Ambulatory Visit | Attending: Family Medicine | Admitting: Family Medicine

## 2017-04-02 DIAGNOSIS — Z1231 Encounter for screening mammogram for malignant neoplasm of breast: Secondary | ICD-10-CM

## 2017-05-20 ENCOUNTER — Other Ambulatory Visit: Payer: Self-pay | Admitting: Family Medicine

## 2017-05-20 DIAGNOSIS — E876 Hypokalemia: Secondary | ICD-10-CM

## 2017-05-30 ENCOUNTER — Other Ambulatory Visit: Payer: Self-pay | Admitting: *Deleted

## 2017-05-30 ENCOUNTER — Telehealth: Payer: Self-pay | Admitting: Family Medicine

## 2017-05-30 ENCOUNTER — Other Ambulatory Visit: Payer: Self-pay | Admitting: Family Medicine

## 2017-05-30 DIAGNOSIS — E876 Hypokalemia: Secondary | ICD-10-CM

## 2017-05-30 NOTE — Telephone Encounter (Signed)
Copied from [place] [phone]. Topic: General - Other >> May 30, 2017 12:25 PM Oneta Rack wrote: Relation to CH:YIFO Call back number:[phone] Pharmacy: [facility] [place], [address] [phone] (Phone) [phone] (Fax)    Reason for call:  Patient scheduled follow up appointment for 06/11/17 but would like PCP to refill her potassium chloride SA (K-DUR,KLOR-CON) 20 MEQ tablet and hydrochlorothiazide (HYDRODIURIL) 25 MG tablet. Patient last seen 05/12/16, please advise

## 2017-05-30 NOTE — Telephone Encounter (Signed)
Was going to extend 1 month- but looks like it is pending with provider.

## 2017-06-04 NOTE — Telephone Encounter (Signed)
rx sent in and left message on machine

## 2017-06-11 ENCOUNTER — Encounter: Payer: Self-pay | Admitting: Family Medicine

## 2017-06-11 ENCOUNTER — Other Ambulatory Visit: Payer: Self-pay

## 2017-06-11 ENCOUNTER — Other Ambulatory Visit: Payer: Self-pay | Admitting: Family Medicine

## 2017-06-11 ENCOUNTER — Ambulatory Visit (INDEPENDENT_AMBULATORY_CARE_PROVIDER_SITE_OTHER): Payer: 59 | Admitting: Family Medicine

## 2017-06-11 VITALS — BP 124/82 | HR 76 | Temp 98.0°F | Ht 65.0 in | Wt 285.0 lb

## 2017-06-11 DIAGNOSIS — R739 Hyperglycemia, unspecified: Secondary | ICD-10-CM

## 2017-06-11 DIAGNOSIS — I10 Essential (primary) hypertension: Secondary | ICD-10-CM | POA: Diagnosis not present

## 2017-06-11 DIAGNOSIS — E876 Hypokalemia: Secondary | ICD-10-CM

## 2017-06-11 LAB — LIPID PANEL
CHOLESTEROL: 150 mg/dL (ref 0–200)
HDL: 43.2 mg/dL (ref >39.00)
LDL Cholesterol: 92 mg/dL (ref 0–99)
NonHDL: 106.64
TRIGLYCERIDES: 74 mg/dL (ref 0.0–149.0)
Total CHOL/HDL Ratio: 3
VLDL: 14.8 mg/dL (ref 0.0–40.0)

## 2017-06-11 LAB — COMPREHENSIVE METABOLIC PANEL
ALBUMIN: 4 g/dL (ref 3.5–5.2)
ALK PHOS: 79 U/L (ref 39–117)
ALT: 27 U/L (ref 0–35)
AST: 27 U/L (ref 0–37)
BILIRUBIN TOTAL: 0.9 mg/dL (ref 0.2–1.2)
BUN: 14 mg/dL (ref 6–23)
CO2: 28 mEq/L (ref 19–32)
Calcium: 9.1 mg/dL (ref 8.4–10.5)
Chloride: 106 mEq/L (ref 96–112)
Creatinine, Ser: 0.64 mg/dL (ref 0.40–1.20)
GFR: 101.15 mL/min (ref >60.00)
Glucose, Bld: 111 mg/dL — ABNORMAL HIGH (ref 70–99)
POTASSIUM: 3.7 meq/L (ref 3.5–5.1)
Sodium: 142 mEq/L (ref 135–145)
TOTAL PROTEIN: 6.6 g/dL (ref 6.0–8.3)

## 2017-06-11 MED ORDER — HYDROCHLOROTHIAZIDE 25 MG PO TABS: 25.0000 mg | ORAL_TABLET | Freq: Every day | ORAL | 1 refills | Status: DC

## 2017-06-11 NOTE — Patient Instructions (Signed)

## 2017-06-11 NOTE — Assessment & Plan Note (Signed)
Well controlled, no changes to meds. Encouraged heart healthy diet such as the DASH diet and exercise as tolerated.  °

## 2017-06-11 NOTE — Progress Notes (Signed)
Subjective:  I acted as a Education administrator for [employer], [place]   Patient ID: Audrey Duncan, female    DOB: January 16, 1961, 56 y.o.   MRN: [id number]  Chief Complaint  Patient presents with  . Medication Refill    HPI  Patient is in today for Rx refills  No complaints  Patient Care Team: Carollee Herter, Alferd Apa, DO as PCP - General   Past Medical History:  Diagnosis Date  . Asthma   . Chiari malformation type II ([place])    diagnosed when in high school  . Colon polyp 2006   (TUBULAR ADENOMA)--COLONOSCOPY-DR. HAYES  . Ulcerative colitis [facility])     Past Surgical History:  Procedure Laterality Date  . ABDOMINAL HYSTERECTOMY  1999   complete  . [place]  . HERNIA REPAIR  [id number]   umbilical    Family History  Problem Relation Age of Onset  . Heart disease Mother 12       2 MIs   . Stroke Mother 43  . Hypertension Mother   . Cancer Mother        lung, lymphoma  . Lung cancer Mother        was a smoker  . COPD Father        smoker  . Cancer Father        colon, lung prostate  . Alcohol abuse Father   . Hypertension Father   . Asthma Sister   . Alcohol abuse Brother   . Diabetes Paternal Uncle     Social History   Socioeconomic History  . Marital status: Married    Spouse name: Not on file  . Number of children: Not on file  . Years of education: Not on file  . Highest education level: Not on file  Social Needs  . Financial resource strain: Not on file  . Food insecurity - worry: Not on file  . Food insecurity - inability: Not on file  . Transportation needs - medical: Not on file  . Transportation needs - non-medical: Not on file  Occupational History  . Occupation: self employed--pest control  Tobacco Use  . Smoking status: Never Smoker  . Smokeless tobacco: Never Used  Substance and Sexual Activity  . Alcohol use: Yes    Comment: 1 glass of wine 1-2 x a month  . Drug use: No  . Sexual activity: Yes    Partners: Male    Other Topics Concern  . Not on file  Social History Narrative  . Not on file    Outpatient Medications Prior to Visit  Medication Sig Dispense Refill  . ALPRAZolam (XANAX) 0.25 MG tablet TAKE 1 TABLET BY MOUTH EVERY NIGHT AT BEDTIME AS NEEDED FOR ANXIETY 30 tablet 0  . famotidine (PEPCID AC) 10 MG chewable tablet Chew 2 tablets (20 mg total) by mouth 2 (two) times daily.    . mometasone-formoterol (DULERA) 200-5 MCG/ACT AERO Take 2 puffs first thing in am and then another 2 puffs about 12 hours later.  0  . Multiple Vitamins-Minerals (CENTRUM SILVER PO) Take by mouth every morning.    . potassium chloride SA (K-DUR,KLOR-CON) 20 MEQ tablet TAKE 1 TABLET(20 MEQ) BY MOUTH DAILY 90 tablet 1  . hydrochlorothiazide (HYDRODIURIL) 25 MG tablet Take 1 tablet (25 mg total) by mouth daily. 30 tablet 0  . hydrochlorothiazide (HYDRODIURIL) 25 MG tablet TAKE 1 TABLET BY MOUTH DAILY 90 tablet 1   Facility-Administered Medications Prior to  Visit  Medication Dose Route Frequency Provider Last Rate Last Dose  . 0.9 %  sodium chloride infusion  500 mL Intravenous Continuous Danis, Estill Cotta III, MD        No Known Allergies  Review of Systems  Constitutional: Negative for chills, fever and malaise/fatigue.  HENT: Negative for congestion and hearing loss.   Eyes: Negative for discharge.  Respiratory: Negative for cough, sputum production and shortness of breath.   Cardiovascular: Negative for chest pain, palpitations and leg swelling.  Gastrointestinal: Negative for abdominal pain, blood in stool, constipation, diarrhea, heartburn, nausea and vomiting.  Genitourinary: Negative for dysuria, frequency, hematuria and urgency.  Musculoskeletal: Negative for back pain, falls and myalgias.  Skin: Negative for rash.  Neurological: Negative for dizziness, sensory change, loss of consciousness, weakness and headaches.  Endo/Heme/Allergies: Negative for environmental allergies. Does not bruise/bleed easily.   Psychiatric/Behavioral: Negative for depression and suicidal ideas. The patient is not nervous/anxious and does not have insomnia.        Objective:    Physical Exam  Constitutional: She is oriented to person, place, and time. She appears well-developed and well-nourished.  HENT:  Head: Normocephalic and atraumatic.  Eyes: Conjunctivae and EOM are normal.  Neck: Normal range of motion. Neck supple. No JVD present. Carotid bruit is not present. No thyromegaly present.  Cardiovascular: Normal rate, regular rhythm and normal heart sounds.  No murmur heard. Pulmonary/Chest: Effort normal and breath sounds normal. No respiratory distress. She has no wheezes. She has no rales. She exhibits no tenderness.  Musculoskeletal: She exhibits no edema.  Neurological: She is alert and oriented to person, place, and time.  Psychiatric: She has a normal mood and affect. Her behavior is normal. Judgment and thought content normal.  Nursing note and vitals reviewed.   BP 124/82   Pulse 76   Temp 98 F (36.7 C)   Ht 5\' 5"  (1.651 m)   Wt 285 lb (129.3 kg)   SpO2 96%   BMI 47.43 kg/m  Wt Readings from Last 3 Encounters:  06/11/17 285 lb (129.3 kg)  05/12/16 277 lb 3.2 oz (125.7 kg)  04/21/16 275 lb (124.7 kg)   BP Readings from Last 3 Encounters:  06/11/17 124/82  05/12/16 108/82  04/21/16 (!) 108/59     Immunization History  Administered Date(s) Administered  . Influenza Whole 03/28/2010, 04/09/2012  . Influenza,inj,Quad PF,6+ Mos 06/12/2014, 04/29/2015, 05/12/2016  . Td 03/28/2010    Health Maintenance  Topic Date Due  . HIV Screening  01/02/1974  . PAP SMEAR  03/13/2017  . MAMMOGRAM  04/03/2019  . TETANUS/TDAP  03/28/2020  . COLONOSCOPY  05/08/2026  . INFLUENZA VACCINE  Completed  . Hepatitis C Screening  Completed    Lab Results  Component Value Date   WBC 8.7 05/12/2016   HGB 14.4 05/12/2016   HCT 43.0 05/12/2016   PLT 316 05/12/2016   GLUCOSE 90 05/12/2016   CHOL  163 05/12/2016   TRIG 235 (H) 05/12/2016   HDL 38 (L) 05/12/2016   LDLCALC 78 05/12/2016   ALT 30 (H) 05/12/2016   AST 26 05/12/2016   NA 144 05/12/2016   K 3.7 05/12/2016   CL 101 05/12/2016   CREATININE 0.96 05/12/2016   BUN 20 05/12/2016   CO2 25 05/12/2016   TSH 2.62 05/12/2016    Lab Results  Component Value Date   TSH 2.62 05/12/2016   Lab Results  Component Value Date   WBC 8.7 05/12/2016   HGB 14.4  05/12/2016   HCT 43.0 05/12/2016   MCV 91.7 05/12/2016   PLT 316 05/12/2016   Lab Results  Component Value Date   NA 144 05/12/2016   K 3.7 05/12/2016   CO2 25 05/12/2016   GLUCOSE 90 05/12/2016   BUN 20 05/12/2016   CREATININE 0.96 05/12/2016   BILITOT 0.7 05/12/2016   ALKPHOS 95 05/12/2016   AST 26 05/12/2016   ALT 30 (H) 05/12/2016   PROT 7.2 05/12/2016   ALBUMIN 4.3 05/12/2016   CALCIUM 9.5 05/12/2016   GFR 70.79 06/12/2014   Lab Results  Component Value Date   CHOL 163 05/12/2016   Lab Results  Component Value Date   HDL 38 (L) 05/12/2016   Lab Results  Component Value Date   LDLCALC 78 05/12/2016   Lab Results  Component Value Date   TRIG 235 (H) 05/12/2016   Lab Results  Component Value Date   CHOLHDL 4.3 05/12/2016   No results found for: HGBA1C       Assessment & Plan:   Problem List Items Addressed This Visit      Unprioritized   Essential hypertension - Primary    Well controlled, no changes to meds. Encouraged heart healthy diet such as the DASH diet and exercise as tolerated.       Relevant Medications   hydrochlorothiazide (HYDRODIURIL) 25 MG tablet   Other Relevant Orders   Lipid panel   Comprehensive metabolic panel    Well controlled, no changes to meds. Encouraged heart healthy diet such as the DASH diet and exercise as tolerated.    I am having Felipa Furnace maintain her Multiple Vitamins-Minerals (CENTRUM SILVER PO), mometasone-formoterol, famotidine, ALPRAZolam, potassium chloride SA, and  hydrochlorothiazide. We will continue to administer sodium chloride.  Meds ordered this encounter  Medications  . hydrochlorothiazide (HYDRODIURIL) 25 MG tablet    Sig: Take 1 tablet (25 mg total) by mouth daily.    Dispense:  90 tablet    Refill:  1    CMA served as Education administrator during this visit. History, Physical and Plan performed by medical provider. Documentation and orders reviewed and attested to.  [name], DO

## 2017-07-21 DIAGNOSIS — J0100 Acute maxillary sinusitis, unspecified: Secondary | ICD-10-CM | POA: Diagnosis not present

## 2017-08-21 ENCOUNTER — Encounter: Payer: Self-pay | Admitting: Family Medicine

## 2017-08-22 ENCOUNTER — Other Ambulatory Visit: Payer: Self-pay | Admitting: Family Medicine

## 2017-08-22 MED ORDER — OSELTAMIVIR PHOSPHATE 75 MG PO CAPS: 75.0000 mg | ORAL_CAPSULE | Freq: Every day | ORAL | 0 refills | Status: DC

## 2017-09-05 ENCOUNTER — Encounter (INDEPENDENT_AMBULATORY_CARE_PROVIDER_SITE_OTHER): Payer: Self-pay

## 2017-09-10 ENCOUNTER — Other Ambulatory Visit: Payer: 59

## 2017-09-10 ENCOUNTER — Ambulatory Visit (INDEPENDENT_AMBULATORY_CARE_PROVIDER_SITE_OTHER): Payer: 59 | Admitting: Family Medicine

## 2017-09-10 ENCOUNTER — Encounter (INDEPENDENT_AMBULATORY_CARE_PROVIDER_SITE_OTHER): Payer: Self-pay | Admitting: Family Medicine

## 2017-09-10 VITALS — BP 123/80 | HR 83 | Temp 97.7°F | Ht 64.0 in | Wt 279.0 lb

## 2017-09-10 DIAGNOSIS — Z9189 Other specified personal risk factors, not elsewhere classified: Secondary | ICD-10-CM

## 2017-09-10 DIAGNOSIS — R739 Hyperglycemia, unspecified: Secondary | ICD-10-CM

## 2017-09-10 DIAGNOSIS — R5383 Other fatigue: Secondary | ICD-10-CM | POA: Insufficient documentation

## 2017-09-10 DIAGNOSIS — Z6841 Body Mass Index (BMI) 40.0 and over, adult: Secondary | ICD-10-CM

## 2017-09-10 DIAGNOSIS — K5909 Other constipation: Secondary | ICD-10-CM | POA: Insufficient documentation

## 2017-09-10 DIAGNOSIS — R0602 Shortness of breath: Secondary | ICD-10-CM | POA: Diagnosis not present

## 2017-09-10 DIAGNOSIS — Z1331 Encounter for screening for depression: Secondary | ICD-10-CM

## 2017-09-10 DIAGNOSIS — Z0289 Encounter for other administrative examinations: Secondary | ICD-10-CM

## 2017-09-10 MED ORDER — POLYETHYLENE GLYCOL 3350 17 GM/SCOOP PO POWD: 17.0000 g | Freq: Every day | ORAL | 0 refills | Status: AC

## 2017-09-10 NOTE — Progress Notes (Signed)
Office: [phone]  /  Fax: [phone]   Dear Dr. Carollee Herter,   Thank you for referring Audrey Duncan to our clinic. The following note includes my evaluation and treatment recommendations.  HPI:   Chief Complaint: OBESITY    Audrey Duncan has been referred by [name], DO for consultation regarding her obesity and obesity related comorbidities.    Audrey Duncan (MR# [id number]) is a 57 y.o. female who presents on 09/10/2017 for obesity evaluation and treatment. Current BMI is Body mass index is 47.89 kg/m.Audrey Duncan Kitchen Audrey Duncan has been struggling with her weight for many years and has been unsuccessful in either losing weight, maintaining weight loss, or reaching her healthy weight goal. She has significant temptations and sabotage at work.     Audrey Duncan attended our information session and states she is currently in the action stage of change and ready to dedicate time achieving and maintaining a healthier weight. Audrey Duncan is interested in becoming our patient and working on intensive lifestyle modifications including (but not limited to) diet, exercise and weight loss.    Audrey Duncan states her family eats meals together she has been heavy most of  her life she started gaining weight after hysterectomy her heaviest weight ever was 279 lbs. she has significant food cravings issues  she skips meals frequently she is frequently drinking liquids with calories she frequently eats larger portions than normal  she has binge eating behaviors she struggles with emotional eating    Fatigue Audrey Duncan feels her energy is lower than it should be. This has worsened with weight gain and has not worsened recently. Audrey Duncan admits to daytime somnolence and  admits to waking up still tired. Patient is at risk for obstructive sleep apnea. Patent has a history of symptoms of daytime fatigue, morning fatigue and morning headache. Patient generally gets 4 to 6 hours of sleep per night,  and states they generally have restless sleep. Snoring is present. Apneic episodes are present. Epworth Sleepiness Score is 9  Dyspnea on exertion Audrey Duncan notes increasing shortness of breath with exercising and seems to be worsening over time with weight gain. She notes getting out of breath sooner with activity than she used to. This has not gotten worse recently. Audrey Duncan denies orthopnea.  Hyperglycemia Jasira has a history of elevated glucose in Epic. There is no Hgb A1c result in Epic. Audrey Duncan admits polyphagia that is worse in the evening.  At risk for cardiovascular disease Audrey Duncan is at a higher than average risk for cardiovascular disease due to obesity. She currently denies any chest pain.  Chronic Constipation Audrey Duncan has a history of constipation, which worsens with stress. She states BM are less frequent and she has some abdominal cramping.She denies melena.   Depression Screen Audrey Duncan's Food and Mood (modified PHQ-9) score was  Depression screen PHQ 2/9 09/10/2017  Decreased Interest 2  Down, Depressed, Hopeless 1  PHQ - 2 Score 3  Altered sleeping 2  Tired, decreased energy 3  Change in appetite 1  Feeling bad or failure about yourself  2  Trouble concentrating 0  Moving slowly or fidgety/restless 0  Suicidal thoughts 0  PHQ-9 Score 11  Difficult doing work/chores Not difficult at all    ALLERGIES: No Known Allergies  MEDICATIONS: Current Outpatient Medications on File Prior to Visit  Medication Sig Dispense Refill  . hydrochlorothiazide (HYDRODIURIL) 25 MG tablet Take 1 tablet (25 mg total) by mouth daily. 90 tablet 1  . Magnesium 250 MG TABS Take 1 tablet by  mouth daily.    . potassium chloride SA (K-DUR,KLOR-CON) 20 MEQ tablet TAKE 1 TABLET(20 MEQ) BY MOUTH DAILY 90 tablet 1  . Probiotic Product (ALIGN PO) Take 1 capsule by mouth daily.     Current Facility-Administered Medications on File Prior to Visit  Medication Dose Route Frequency Provider  Last Rate Last Dose  . 0.9 %  sodium chloride infusion  500 mL Intravenous Continuous Doran Stabler, MD        PAST MEDICAL HISTORY: Past Medical History:  Diagnosis Date  . Asthma   . Chiari malformation type II ([place])    diagnosed when in high school  . Chiari malformation type II ([place])   . Colon polyp 2006   (TUBULAR ADENOMA)--COLONOSCOPY-DR. HAYES  . Constipation   . Fluid retention   . H/O seasonal allergies   . Medial meniscus tear    left  . Over weight   . Ulcerative colitis ([place])     PAST SURGICAL HISTORY: Past Surgical History:  Procedure Laterality Date  . ABDOMINAL HYSTERECTOMY  1999   complete  . [place]  . HERNIA REPAIR  1245   umbilical    SOCIAL HISTORY: Social History   Tobacco Use  . Smoking status: Never Smoker  . Smokeless tobacco: Never Used  Substance Use Topics  . Alcohol use: Yes    Comment: 1 glass of wine 1-2 x a month  . Drug use: No    FAMILY HISTORY: Family History  Problem Relation Age of Onset  . Heart disease Mother 56       2 MIs   . Stroke Mother 67  . Hypertension Mother   . Cancer Mother        lung, lymphoma  . Lung cancer Mother        was a smoker  . Kidney disease Mother   . Obesity Mother   . COPD Father        smoker  . Cancer Father        colon, lung prostate  . Alcohol abuse Father   . Hypertension Father   . Obesity Father   . Asthma Sister   . Alcohol abuse Brother   . Diabetes Paternal Uncle     ROS: Review of Systems  Constitutional: Positive for malaise/fatigue.  HENT: Positive for congestion (nasal stuffiness) and sinus pain.        Hay Fever  Eyes:       Wear Glasses or Contacts  Respiratory: Positive for shortness of breath (with activity) and wheezing.   Cardiovascular:       Leg Cramping  Gastrointestinal: Positive for constipation, diarrhea and heartburn. Negative for melena.       Rectal Bleeding Positive for abdominal cramping  Musculoskeletal:        Muscle or Joint Pain  Neurological: Positive for headaches.  Endo/Heme/Allergies:       Positive for polyphagia  Psychiatric/Behavioral: The patient has insomnia.        Stress    PHYSICAL EXAM: Blood pressure 123/80, pulse 83, temperature 97.7 F (36.5 C), temperature source Oral, height 5\' 4"  (1.626 m), weight 279 lb (126.6 kg), SpO2 95 %. Body mass index is 47.89 kg/m. Physical Exam  Constitutional: She is oriented to person, place, and time. She appears well-developed and well-nourished.  HENT:  Head: Normocephalic and atraumatic.  Nose: Nose normal.  Eyes: EOM are normal. No scleral icterus.  Neck: Normal range of motion. Neck supple.  No thyromegaly present.  Cardiovascular: Normal rate and regular rhythm.  Pulmonary/Chest: Effort normal. No respiratory distress.  Abdominal: Soft. There is no tenderness.  + obesity  Musculoskeletal: Normal range of motion.  Range of Motion normal in all 4 extremities  Neurological: She is oriented to person, place, and time.  Skin: Skin is warm and dry.  Psychiatric: She has a normal mood and affect. Her behavior is normal.  Vitals reviewed.   RECENT LABS AND TESTS: BMET    Component Value Date/Time   NA 142 06/11/2017 0950   K 3.7 06/11/2017 0950   CL 106 06/11/2017 0950   CO2 28 06/11/2017 0950   GLUCOSE 111 (H) 06/11/2017 0950   GLUCOSE 95 05/11/2006 1148   BUN 14 06/11/2017 0950   CREATININE 0.64 06/11/2017 0950   CREATININE 0.96 05/12/2016 1612   CALCIUM 9.1 06/11/2017 0950   GFRNONAA 90.68 03/28/2010 0954   GFRAA 114 04/09/2008 1100   No results found for: HGBA1C No results found for: INSULIN CBC    Component Value Date/Time   WBC 8.7 05/12/2016 1612   RBC 4.69 05/12/2016 1612   HGB 14.4 05/12/2016 1612   HCT 43.0 05/12/2016 1612   PLT 316 05/12/2016 1612   MCV 91.7 05/12/2016 1612   MCH 30.7 05/12/2016 1612   MCHC 33.5 05/12/2016 1612   RDW 13.2 05/12/2016 1612   LYMPHSABS 2,349 05/12/2016 1612   MONOABS 696  05/12/2016 1612   EOSABS 261 05/12/2016 1612   BASOSABS 87 05/12/2016 1612   Iron/TIBC/Ferritin/ %Sat No results found for: IRON, TIBC, FERRITIN, IRONPCTSAT Lipid Panel     Component Value Date/Time   CHOL 150 06/11/2017 0950   TRIG 74.0 06/11/2017 0950   HDL 43.20 06/11/2017 0950   CHOLHDL 3 06/11/2017 0950   VLDL 14.8 06/11/2017 0950   LDLCALC 92 06/11/2017 0950   Hepatic Function Panel     Component Value Date/Time   PROT 6.6 06/11/2017 0950   ALBUMIN 4.0 06/11/2017 0950   AST 27 06/11/2017 0950   ALT 27 06/11/2017 0950   ALKPHOS 79 06/11/2017 0950   BILITOT 0.9 06/11/2017 0950   BILIDIR 0.1 06/12/2014 1359      Component Value Date/Time   TSH 2.62 05/12/2016 1612   TSH 1.87 06/12/2014 1359   TSH 1.16 03/31/2011 0939   Vitamin D   Ref. Range 03/28/2010 21:29  Vitamin D, 25-Hydroxy Latest Ref Range: 30 - 89 ng/mL 32    ECG  shows NSR with a rate of 84 BPM INDIRECT CALORIMETER done today shows a VO2 of 211 and a REE of 1409.  Her calculated basal metabolic rate is 6213 thus her basal metabolic rate is worse than expected.    ASSESSMENT AND PLAN: Other fatigue - Plan: EKG 12-Lead, Vitamin B12, CBC With Differential, Folate, Lipid Panel With LDL/HDL Ratio, T3, T4, free, TSH, VITAMIN D 25 Hydroxy (Vit-D Deficiency, Fractures)  Shortness of breath on exertion - Plan: CBC With Differential  Hyperglycemia - Plan: Comprehensive metabolic panel, Hemoglobin A1c, Insulin, random  Chronic constipation - Plan: polyethylene glycol powder (GLYCOLAX/MIRALAX) powder  Depression screening  At risk for heart disease  Class 3 severe obesity with serious comorbidity and body mass index (BMI) of 45.0 to 49.9 in adult, unspecified obesity type (HCC)  PLAN: Fatigue Audrey Duncan was informed that her fatigue may be related to obesity, depression or many other causes. Labs will be ordered, and in the meanwhile Medora has agreed to work on diet, exercise and weight loss to help  with  fatigue. Proper sleep hygiene was discussed including the need for 7-8 hours of quality sleep each night. A sleep study was not ordered based on symptoms and Epworth score.  Dyspnea on exertion Audrey Duncan's shortness of breath appears to be obesity related and exercise induced. She has agreed to work on weight loss and gradually increase exercise to treat her exercise induced shortness of breath. If Shanetha follows our instructions and loses weight without improvement of her shortness of breath, we will plan to refer to pulmonology. We will monitor this condition regularly. Audrey Duncan agrees to this plan.  Hyperglycemia We will check labs and in the meanwhile, Audrey Duncan agrees to work on diet, exercise and weight loss. She agrees to follow up with our clinic in 2 weeks.  Cardiovascular risk counselling Audrey Duncan was given extended (15 minutes) coronary artery disease prevention counseling today. She is 57 y.o. female and has risk factors for heart disease including obesity. We discussed intensive lifestyle modifications today with an emphasis on specific weight loss instructions and strategies. Pt was also informed of the importance of increasing exercise and decreasing saturated fats to help prevent heart disease.  Chronic Constipation Audrey Duncan was advised to take 1/2 to 1 dose (17 grams) OTC Miralax qd and increase her H2O intake. She was educated on how constipation may worsen initially as we change her eating and work on weight loss. She was informed decrease bowel movement frequency is normal while losing weight, but stools should not be hard or painful. She was advised to increase her fiber intake. High fiber foods were discussed today.  Depression Screen Audrey Duncan had a moderately positive depression screening. Depression is commonly associated with obesity and often results in emotional eating behaviors. We will monitor this closely and work on CBT to help improve the non-hunger eating patterns.  Referral to Psychology may be required if no improvement is seen as she continues in our clinic.  Obesity Audrey Duncan is currently in the action stage of change and her goal is to continue with weight loss efforts. I recommend Millissa begin the structured treatment plan as follows:  She has agreed to follow the Category 2 plan Levi has been instructed to eventually work up to a goal of 150 minutes of combined cardio and strengthening exercise per week for weight loss and overall health benefits. We discussed the following Behavioral Modification Strategies today: increase H2O intake, increasing lean protein intake, decreasing simple carbohydrates , increasing vegetables and increasing fiber rich foods   She was informed of the importance of frequent follow up visits to maximize her success with intensive lifestyle modifications for her multiple health conditions. She was informed we would discuss her lab results at her next visit unless there is a critical issue that needs to be addressed sooner. Vivien agreed to keep her next visit at the agreed upon time to discuss these results.    OBESITY BEHAVIORAL INTERVENTION VISIT  Today's visit was # 1 out of 22.  Starting weight: 279 lbs Starting date: 09/10/17 Today's weight : 279 lbs  Today's date: 09/10/2017 Total lbs lost to date: 0 (Patients must lose 7 lbs in the first 6 months to continue with counseling)   ASK: We discussed the diagnosis of obesity with Audrey Duncan today and Zeina agreed to give [place] permission to discuss obesity behavioral modification therapy today.  ASSESS: Ailis has the diagnosis of obesity and her BMI today is 47.87 Ryder is in the action stage of change   ADVISE: Teyonna was educated  on the multiple health risks of obesity as well as the benefit of weight loss to improve her health. She was advised of the need for long term treatment and the importance of lifestyle  modifications.  AGREE: Multiple dietary modification options and treatment options were discussed and  Aarika agreed to the above obesity treatment plan.   I, Doreene Nest, am acting as transcriptionist for  Dennard Nip, MD  I have reviewed the above documentation for accuracy and completeness, and I agree with the above. -Dennard Nip, MD

## 2017-09-11 LAB — LIPID PANEL WITH LDL/HDL RATIO
Cholesterol, Total: 144 mg/dL (ref 100–199)
HDL: 41 mg/dL (ref >39)
LDL Calculated: 83 mg/dL (ref 0–99)
LDl/HDL Ratio: 2 ratio (ref 0.0–3.2)
TRIGLYCERIDES: 100 mg/dL (ref 0–149)
VLDL CHOLESTEROL CAL: 20 mg/dL (ref 5–40)

## 2017-09-11 LAB — COMPREHENSIVE METABOLIC PANEL
A/G RATIO: 1.9 (ref 1.2–2.2)
ALT: 26 IU/L (ref 0–32)
AST: 24 IU/L (ref 0–40)
Albumin: 4.3 g/dL (ref 3.5–5.5)
Alkaline Phosphatase: 98 IU/L (ref 39–117)
BUN/Creatinine Ratio: 20 (ref 9–23)
BUN: 14 mg/dL (ref 6–24)
Bilirubin Total: 1 mg/dL (ref 0.0–1.2)
CALCIUM: 9.2 mg/dL (ref 8.7–10.2)
CO2: 27 mmol/L (ref 20–29)
Chloride: 102 mmol/L (ref 96–106)
Creatinine, Ser: 0.7 mg/dL (ref 0.57–1.00)
GFR calc Af Amer: 110 mL/min/{1.73_m2} (ref >59)
GFR, EST NON AFRICAN AMERICAN: 96 mL/min/{1.73_m2} (ref >59)
Globulin, Total: 2.3 g/dL (ref 1.5–4.5)
Glucose: 111 mg/dL — ABNORMAL HIGH (ref 65–99)
POTASSIUM: 3.9 mmol/L (ref 3.5–5.2)
Sodium: 141 mmol/L (ref 134–144)
Total Protein: 6.6 g/dL (ref 6.0–8.5)

## 2017-09-11 LAB — CBC WITH DIFFERENTIAL
Basophils Absolute: 0.1 10*3/uL (ref 0.0–0.2)
Basos: 1 %
EOS (ABSOLUTE): 0.2 10*3/uL (ref 0.0–0.4)
Eos: 3 %
Hematocrit: 44.2 % (ref 34.0–46.6)
Hemoglobin: 14.5 g/dL (ref 11.1–15.9)
IMMATURE GRANULOCYTES: 1 %
Immature Grans (Abs): 0 10*3/uL (ref 0.0–0.1)
Lymphocytes Absolute: 1.7 10*3/uL (ref 0.7–3.1)
Lymphs: 26 %
MCH: 31.5 pg (ref 26.6–33.0)
MCHC: 32.8 g/dL (ref 31.5–35.7)
MCV: 96 fL (ref 79–97)
MONOS ABS: 0.5 10*3/uL (ref 0.1–0.9)
Monocytes: 7 %
NEUTROS PCT: 62 %
Neutrophils Absolute: 4.1 10*3/uL (ref 1.4–7.0)
RBC: 4.61 x10E6/uL (ref 3.77–5.28)
RDW: 13.4 % (ref 12.3–15.4)
WBC: 6.6 10*3/uL (ref 3.4–10.8)

## 2017-09-11 LAB — HEMOGLOBIN A1C
Est. average glucose Bld gHb Est-mCnc: 105 mg/dL
Hgb A1c MFr Bld: 5.3 % (ref 4.8–5.6)

## 2017-09-11 LAB — INSULIN, RANDOM: INSULIN: 39.9 u[IU]/mL — ABNORMAL HIGH (ref 2.6–24.9)

## 2017-09-11 LAB — T4, FREE: Free T4: 1.36 ng/dL (ref 0.82–1.77)

## 2017-09-11 LAB — T3: T3 TOTAL: 141 ng/dL (ref 71–180)

## 2017-09-11 LAB — VITAMIN D 25 HYDROXY (VIT D DEFICIENCY, FRACTURES): Vit D, 25-Hydroxy: 11 ng/mL — ABNORMAL LOW (ref 30.0–100.0)

## 2017-09-11 LAB — FOLATE: Folate: 13.3 ng/mL (ref >3.0)

## 2017-09-11 LAB — TSH: TSH: 2.18 u[IU]/mL (ref 0.450–4.500)

## 2017-09-11 LAB — VITAMIN B12: Vitamin B-12: 351 pg/mL (ref 232–1245)

## 2017-09-19 ENCOUNTER — Other Ambulatory Visit: Payer: 59

## 2017-09-25 ENCOUNTER — Ambulatory Visit (INDEPENDENT_AMBULATORY_CARE_PROVIDER_SITE_OTHER): Payer: 59 | Admitting: Family Medicine

## 2017-09-25 VITALS — BP 128/84 | HR 87 | Temp 98.0°F | Ht 64.0 in | Wt 276.0 lb

## 2017-09-25 DIAGNOSIS — Z9189 Other specified personal risk factors, not elsewhere classified: Secondary | ICD-10-CM

## 2017-09-25 DIAGNOSIS — E559 Vitamin D deficiency, unspecified: Secondary | ICD-10-CM

## 2017-09-25 DIAGNOSIS — R7303 Prediabetes: Secondary | ICD-10-CM

## 2017-09-25 DIAGNOSIS — E66813 Obesity, class 3: Secondary | ICD-10-CM

## 2017-09-25 DIAGNOSIS — Z6841 Body Mass Index (BMI) 40.0 and over, adult: Secondary | ICD-10-CM

## 2017-09-26 MED ORDER — METFORMIN HCL 500 MG PO TABS
500.0000 mg | ORAL_TABLET | Freq: Every day | ORAL | 0 refills | Status: DC
Start: 2017-09-26 — End: 2017-10-17

## 2017-09-26 MED ORDER — VITAMIN D (ERGOCALCIFEROL) 1.25 MG (50000 UNIT) PO CAPS: 50000.0000 [IU] | ORAL_CAPSULE | ORAL | 0 refills | Status: DC

## 2017-09-26 NOTE — Progress Notes (Signed)
Office: [phone]  /  Fax: [phone]   HPI:   Chief Complaint: OBESITY Audrey Duncan is here to discuss her progress with her obesity treatment plan. She is on the Category 2 plan and is following her eating plan approximately 95 % of the time. She states she is walking for 10 to 15 minutes 5 times per week. Audrey Duncan has done very well with weight loss on her category 2 plan. Hunger was controlled. She struggled to eat all of her food, but she was able to manage. Her weight is 276 lb (125.2 kg) today and has had a weight loss of 3 pounds over a period of 2 weeks since her last visit. She has lost 3 lbs since starting treatment with [place].  Vitamin D deficiency (new) Audrey Duncan has a new diagnosis of vitamin D deficiency. She is not currently taking vit D. Audrey Duncan admits fatigue and denies nausea, vomiting or muscle weakness.  Pre-Diabetes Audrey Duncan has a new diagnosis of pre-diabetes. Her Hgb A1c was normal, but her fasting insulin level and glucose are elevated. Audrey Duncan was informed this puts her at greater risk of developing diabetes. She is not taking metformin currently and continues to work on diet and exercise to decrease risk of diabetes. She has noted episodes of hypoglycemia in the past.   At risk for diabetes Audrey Duncan is at higher than average risk for developing diabetes due to her obesity and pre-diabetes. She currently denies polyuria or polydipsia.  ALLERGIES: No Known Allergies  MEDICATIONS: Current Outpatient Medications on File Prior to Visit  Medication Sig Dispense Refill  . hydrochlorothiazide (HYDRODIURIL) 25 MG tablet Take 1 tablet (25 mg total) by mouth daily. 90 tablet 1  . Magnesium 250 MG TABS Take 1 tablet by mouth daily.    . polyethylene glycol powder (GLYCOLAX/MIRALAX) powder Take 17 g by mouth daily. 3350 g 0  . potassium chloride SA (K-DUR,KLOR-CON) 20 MEQ tablet TAKE 1 TABLET(20 MEQ) BY MOUTH DAILY 90 tablet 1  . Probiotic Product (ALIGN PO) Take 1  capsule by mouth daily.     Current Facility-Administered Medications on File Prior to Visit  Medication Dose Route Frequency Provider Last Rate Last Dose  . 0.9 %  sodium chloride infusion  500 mL Intravenous Continuous Doran Stabler, MD        PAST MEDICAL HISTORY: Past Medical History:  Diagnosis Date  . Asthma   . Chiari malformation type II ([place])    diagnosed when in high school  . Chiari malformation type II ([place])   . Colon polyp 2006   (TUBULAR ADENOMA)--COLONOSCOPY-DR. HAYES  . Constipation   . Fluid retention   . H/O seasonal allergies   . Medial meniscus tear    left  . Over weight   . Ulcerative colitis ([place])     PAST SURGICAL HISTORY: Past Surgical History:  Procedure Laterality Date  . ABDOMINAL HYSTERECTOMY  1999   complete  . [place]  . HERNIA REPAIR  4174   umbilical    SOCIAL HISTORY: Social History   Tobacco Use  . Smoking status: Never Smoker  . Smokeless tobacco: Never Used  Substance Use Topics  . Alcohol use: Yes    Comment: 1 glass of wine 1-2 x a month  . Drug use: No    FAMILY HISTORY: Family History  Problem Relation Age of Onset  . Heart disease Mother 21       2 MIs   . Stroke Mother 69  .  Hypertension Mother   . Cancer Mother        lung, lymphoma  . Lung cancer Mother        was a smoker  . Kidney disease Mother   . Obesity Mother   . COPD Father        smoker  . Cancer Father        colon, lung prostate  . Alcohol abuse Father   . Hypertension Father   . Obesity Father   . Asthma Sister   . Alcohol abuse Brother   . Diabetes Paternal Uncle     ROS: Review of Systems  Constitutional: Positive for malaise/fatigue and weight loss.  Gastrointestinal: Negative for nausea and vomiting.  Genitourinary: Negative for frequency.  Musculoskeletal:       Negative for muscle weakness  Endo/Heme/Allergies: Negative for polydipsia.       Positive for hypoglycemia    PHYSICAL EXAM: Blood  pressure 128/84, pulse 87, temperature 98 F (36.7 C), temperature source Oral, height 5\' 4"  (1.626 m), weight 276 lb (125.2 kg), SpO2 95 %. Body mass index is 47.38 kg/m. Physical Exam  Constitutional: She is oriented to person, place, and time. She appears well-developed and well-nourished.  Cardiovascular: Normal rate.  Pulmonary/Chest: Effort normal.  Musculoskeletal: Normal range of motion.  Neurological: She is oriented to person, place, and time.  Skin: Skin is warm and dry.  Psychiatric: She has a normal mood and affect. Her behavior is normal.  Vitals reviewed.   RECENT LABS AND TESTS: BMET    Component Value Date/Time   NA 141 09/10/2017 0942   K 3.9 09/10/2017 0942   CL 102 09/10/2017 0942   CO2 27 09/10/2017 0942   GLUCOSE 111 (H) 09/10/2017 0942   GLUCOSE 111 (H) 06/11/2017 0950   GLUCOSE 95 05/11/2006 1148   BUN 14 09/10/2017 0942   CREATININE 0.70 09/10/2017 0942   CREATININE 0.96 05/12/2016 1612   CALCIUM 9.2 09/10/2017 0942   GFRNONAA 96 09/10/2017 0942   GFRAA 110 09/10/2017 0942   Lab Results  Component Value Date   HGBA1C 5.3 09/10/2017   Lab Results  Component Value Date   INSULIN 39.9 (H) 09/10/2017   CBC    Component Value Date/Time   WBC 6.6 09/10/2017 0942   WBC 8.7 05/12/2016 1612   RBC 4.61 09/10/2017 0942   RBC 4.69 05/12/2016 1612   HGB 14.5 09/10/2017 0942   HCT 44.2 09/10/2017 0942   PLT 316 05/12/2016 1612   MCV 96 09/10/2017 0942   MCH 31.5 09/10/2017 0942   MCH 30.7 05/12/2016 1612   MCHC 32.8 09/10/2017 0942   MCHC 33.5 05/12/2016 1612   RDW 13.4 09/10/2017 0942   LYMPHSABS 1.7 09/10/2017 0942   MONOABS 696 05/12/2016 1612   EOSABS 0.2 09/10/2017 0942   BASOSABS 0.1 09/10/2017 0942   Iron/TIBC/Ferritin/ %Sat No results found for: IRON, TIBC, FERRITIN, IRONPCTSAT Lipid Panel     Component Value Date/Time   CHOL 144 09/10/2017 0942   TRIG 100 09/10/2017 0942   HDL 41 09/10/2017 0942   CHOLHDL 3 06/11/2017 0950    VLDL 14.8 06/11/2017 0950   LDLCALC 83 09/10/2017 0942   Hepatic Function Panel     Component Value Date/Time   PROT 6.6 09/10/2017 0942   ALBUMIN 4.3 09/10/2017 0942   AST 24 09/10/2017 0942   ALT 26 09/10/2017 0942   ALKPHOS 98 09/10/2017 0942   BILITOT 1.0 09/10/2017 0942   BILIDIR 0.1 06/12/2014 1359  Component Value Date/Time   TSH 2.180 09/10/2017 0942   TSH 2.62 05/12/2016 1612   TSH 1.87 06/12/2014 1359   Results for MERLEAN, PIZZINI (MRN [id number]) as of 09/26/2017 11:19  Ref. Range 09/10/2017 09:42  Vitamin D, 25-Hydroxy Latest Ref Range: 30.0 - 100.0 ng/mL 11.0 (L)   ASSESSMENT AND PLAN: Vitamin D deficiency - Plan: Vitamin D, Ergocalciferol, (DRISDOL) 50000 units CAPS capsule  Prediabetes - Plan: metFORMIN (GLUCOPHAGE) 500 MG tablet  At risk for diabetes mellitus  Class 3 severe obesity with serious comorbidity and body mass index (BMI) of 45.0 to 49.9 in adult, unspecified obesity type ([place])  PLAN:  Vitamin D Deficiency Audrey Duncan was informed that low vitamin D levels contributes to fatigue and are associated with obesity, breast, and colon cancer. She agrees to start to take prescription Vit D @50 ,000 IU every week #4 with no refills. We will recheck labs in 3 months and she will follow up for routine testing of vitamin D, at least 2-3 times per year. She was informed of the risk of over-replacement of vitamin D and agrees to not increase her dose unless she discusses this with [place] first. Audrey Duncan agrees to follow up with our clinic in 2 weeks.  Pre-Diabetes Audrey Duncan will continue to work on weight loss, exercise, and decreasing simple carbohydrates in her diet to help decrease the risk of diabetes. We dicussed metformin including benefits and risks. She was informed that eating too many simple carbohydrates or too many calories at one sitting increases the likelihood of GI side effects. Jamilla agrees to start  metformin 500 mg daily with breakfast #30 with  no refills  We will recheck labs in 3 months and Audrey Duncan agreed to follow up with [place] as directed to monitor her progress.  Diabetes risk counseling Audrey Duncan was given extended (30 minutes) diabetes prevention counseling today. She is 57 y.o. female and has risk factors for diabetes including obesity and pre-diabetes. We discussed intensive lifestyle modifications today with an emphasis on weight loss as well as increasing exercise and decreasing simple carbohydrates in her diet.  Obesity Audrey Duncan is currently in the action stage of change. As such, her goal is to continue with weight loss efforts She has agreed to follow the Category 2 plan Audrey Duncan has been instructed to work up to a goal of 150 minutes of combined cardio and strengthening exercise per week for weight loss and overall health benefits. We discussed the following Behavioral Modification Strategies today: keeping healthy foods in the home, better snacking choices, increasing lean protein intake and decreasing simple carbohydrates   Audrey Duncan has agreed to follow up with our clinic in 2 weeks. She was informed of the importance of frequent follow up visits to maximize her success with intensive lifestyle modifications for her multiple health conditions.   OBESITY BEHAVIORAL INTERVENTION VISIT  Today's visit was # 2 out of 22.  Starting weight: 279 lbs Starting date: 09/10/17 Today's weight : 276 lbs  Today's date: 09/25/2017 Total lbs lost to date: 3 (Patients must lose 7 lbs in the first 6 months to continue with counseling)   ASK: We discussed the diagnosis of obesity with Audrey Duncan today and Audrey Duncan agreed to give [place] permission to discuss obesity behavioral modification therapy today.  ASSESS: Audrey Duncan has the diagnosis of obesity and her BMI today is 47.75 Audrey Duncan is in the action stage of change   ADVISE: Audrey Duncan was educated on the multiple health risks of obesity as well as the benefit of  weight loss  to improve her health. She was advised of the need for long term treatment and the importance of lifestyle modifications.  AGREE: Multiple dietary modification options and treatment options were discussed and  Audrey Duncan agreed to the above obesity treatment plan.  I, Doreene Nest, am acting as transcriptionist for Dennard Nip, MD  I have reviewed the above documentation for accuracy and completeness, and I agree with the above. -Dennard Nip, MD

## 2017-09-27 ENCOUNTER — Encounter (INDEPENDENT_AMBULATORY_CARE_PROVIDER_SITE_OTHER): Payer: Self-pay | Admitting: Family Medicine

## 2017-09-29 ENCOUNTER — Encounter: Payer: Self-pay | Admitting: Family Medicine

## 2017-10-02 ENCOUNTER — Encounter: Payer: 59 | Admitting: Family Medicine

## 2017-10-17 ENCOUNTER — Ambulatory Visit (INDEPENDENT_AMBULATORY_CARE_PROVIDER_SITE_OTHER): Payer: 59 | Admitting: Family Medicine

## 2017-10-17 VITALS — BP 120/80 | HR 80 | Temp 97.6°F | Ht 64.0 in | Wt 271.0 lb

## 2017-10-17 DIAGNOSIS — E8881 Metabolic syndrome: Secondary | ICD-10-CM | POA: Diagnosis not present

## 2017-10-17 DIAGNOSIS — E88819 Insulin resistance, unspecified: Secondary | ICD-10-CM

## 2017-10-17 DIAGNOSIS — E559 Vitamin D deficiency, unspecified: Secondary | ICD-10-CM

## 2017-10-17 DIAGNOSIS — E66813 Obesity, class 3: Secondary | ICD-10-CM

## 2017-10-17 DIAGNOSIS — Z9189 Other specified personal risk factors, not elsewhere classified: Secondary | ICD-10-CM

## 2017-10-17 DIAGNOSIS — Z6841 Body Mass Index (BMI) 40.0 and over, adult: Secondary | ICD-10-CM | POA: Diagnosis not present

## 2017-10-17 MED ORDER — VITAMIN D (ERGOCALCIFEROL) 1.25 MG (50000 UNIT) PO CAPS: 50000.0000 [IU] | ORAL_CAPSULE | ORAL | 0 refills | Status: DC

## 2017-10-17 MED ORDER — METFORMIN HCL 500 MG PO TABS: 500.0000 mg | ORAL_TABLET | Freq: Every day | ORAL | 0 refills | Status: DC

## 2017-10-17 NOTE — Progress Notes (Signed)
Office: [phone]  /  Fax: [phone]   HPI:   Chief Complaint: OBESITY Audrey Duncan is here to discuss her progress with her obesity treatment plan. She is on the Category 2 plan and is following her eating plan approximately 95 % of the time. She states she is walking for 30 minutes 3 times per week. Audrey Duncan continues to do well with weight loss. Hunger is mostly controlled but she is getting bored with breakfast. She notes temptations and some sabotage at work but is doing well avoiding this.  Her weight is 271 lb (122.9 kg) today and has had a weight loss of 5 pounds over a period of 3 weeks since her last visit. She has lost 8 lbs since starting treatment with [place].  Vitamin D Deficiency Audrey Duncan has a diagnosis of vitamin D deficiency. She is stable on prescription Vit D. She notes no change in fatigue and denies nausea, vomiting or muscle weakness.  Insulin Resistance Audrey Duncan has a diagnosis of insulin resistance based on her elevated fasting insulin level >5. Although Audrey Duncan's blood glucose readings are still under good control, insulin resistance puts her at greater risk of metabolic syndrome and diabetes. She started metformin and had GI upset for 2 days but this has resolved. She is doing well on diet prescription and continues to work on exercise to decrease risk of diabetes. She denies hypoglycemia.  At risk for diabetes Audrey Duncan is at higher than average risk for developing diabetes due to her obesity and insulin resistance. She currently denies polyuria or polydipsia.  ALLERGIES: No Known Allergies  MEDICATIONS: Current Outpatient Medications on File Prior to Visit  Medication Sig Dispense Refill  . hydrochlorothiazide (HYDRODIURIL) 25 MG tablet Take 1 tablet (25 mg total) by mouth daily. 90 tablet 1  . Magnesium 250 MG TABS Take 1 tablet by mouth daily.    . polyethylene glycol powder (GLYCOLAX/MIRALAX) powder Take 17 g by mouth daily. 3350 g 0  . potassium chloride  SA (K-DUR,KLOR-CON) 20 MEQ tablet TAKE 1 TABLET(20 MEQ) BY MOUTH DAILY 90 tablet 1  . Probiotic Product (ALIGN PO) Take 1 capsule by mouth daily.     Current Facility-Administered Medications on File Prior to Visit  Medication Dose Route Frequency Provider Last Rate Last Dose  . 0.9 %  sodium chloride infusion  500 mL Intravenous Continuous Doran Stabler, MD        PAST MEDICAL HISTORY: Past Medical History:  Diagnosis Date  . Asthma   . Chiari malformation type II ([place])    diagnosed when in high school  . Chiari malformation type II ([place])   . Colon polyp 2006   (TUBULAR ADENOMA)--COLONOSCOPY-DR. HAYES  . Constipation   . Fluid retention   . H/O seasonal allergies   . Medial meniscus tear    left  . Over weight   . Ulcerative colitis ([place])     PAST SURGICAL HISTORY: Past Surgical History:  Procedure Laterality Date  . ABDOMINAL HYSTERECTOMY  1999   complete  . [place]  . HERNIA REPAIR  [id number]   umbilical    SOCIAL HISTORY: Social History   Tobacco Use  . Smoking status: Never Smoker  . Smokeless tobacco: Never Used  Substance Use Topics  . Alcohol use: Yes    Comment: 1 glass of wine 1-2 x a month  . Drug use: No    FAMILY HISTORY: Family History  Problem Relation Age of Onset  . Heart disease Mother  55       2 MIs   . Stroke Mother 40  . Hypertension Mother   . Cancer Mother        lung, lymphoma  . Lung cancer Mother        was a smoker  . Kidney disease Mother   . Obesity Mother   . COPD Father        smoker  . Cancer Father        colon, lung prostate  . Alcohol abuse Father   . Hypertension Father   . Obesity Father   . Asthma Sister   . Alcohol abuse Brother   . Diabetes Paternal Uncle     ROS: Review of Systems  Constitutional: Positive for malaise/fatigue and weight loss.  Gastrointestinal: Negative for nausea and vomiting.  Genitourinary: Negative for frequency.  Musculoskeletal:       Negative muscle  weakness  Endo/Heme/Allergies: Negative for polydipsia.       Negative hypoglycemia    PHYSICAL EXAM: Blood pressure 120/80, pulse 80, temperature 97.6 F (36.4 C), temperature source Oral, height 5\' 4"  (1.626 m), weight 271 lb (122.9 kg), SpO2 97 %. Body mass index is 46.52 kg/m. Physical Exam  Constitutional: She is oriented to person, place, and time. She appears well-developed and well-nourished.  Cardiovascular: Normal rate.  Pulmonary/Chest: Effort normal.  Musculoskeletal: Normal range of motion.  Neurological: She is oriented to person, place, and time.  Skin: Skin is warm and dry.  Psychiatric: She has a normal mood and affect. Her behavior is normal.  Vitals reviewed.   RECENT LABS AND TESTS: BMET    Component Value Date/Time   NA 141 09/10/2017 0942   K 3.9 09/10/2017 0942   CL 102 09/10/2017 0942   CO2 27 09/10/2017 0942   GLUCOSE 111 (H) 09/10/2017 0942   GLUCOSE 111 (H) 06/11/2017 0950   GLUCOSE 95 05/11/2006 1148   BUN 14 09/10/2017 0942   CREATININE 0.70 09/10/2017 0942   CREATININE 0.96 05/12/2016 1612   CALCIUM 9.2 09/10/2017 0942   GFRNONAA 96 09/10/2017 0942   GFRAA 110 09/10/2017 0942   Lab Results  Component Value Date   HGBA1C 5.3 09/10/2017   Lab Results  Component Value Date   INSULIN 39.9 (H) 09/10/2017   CBC    Component Value Date/Time   WBC 6.6 09/10/2017 0942   WBC 8.7 05/12/2016 1612   RBC 4.61 09/10/2017 0942   RBC 4.69 05/12/2016 1612   HGB 14.5 09/10/2017 0942   HCT 44.2 09/10/2017 0942   PLT 316 05/12/2016 1612   MCV 96 09/10/2017 0942   MCH 31.5 09/10/2017 0942   MCH 30.7 05/12/2016 1612   MCHC 32.8 09/10/2017 0942   MCHC 33.5 05/12/2016 1612   RDW 13.4 09/10/2017 0942   LYMPHSABS 1.7 09/10/2017 0942   MONOABS 696 05/12/2016 1612   EOSABS 0.2 09/10/2017 0942   BASOSABS 0.1 09/10/2017 0942   Iron/TIBC/Ferritin/ %Sat No results found for: IRON, TIBC, FERRITIN, IRONPCTSAT Lipid Panel     Component Value Date/Time    CHOL 144 09/10/2017 0942   TRIG 100 09/10/2017 0942   HDL 41 09/10/2017 0942   CHOLHDL 3 06/11/2017 0950   VLDL 14.8 06/11/2017 0950   LDLCALC 83 09/10/2017 0942   Hepatic Function Panel     Component Value Date/Time   PROT 6.6 09/10/2017 0942   ALBUMIN 4.3 09/10/2017 0942   AST 24 09/10/2017 0942   ALT 26 09/10/2017 0942   ALKPHOS 98  09/10/2017 0942   BILITOT 1.0 09/10/2017 0942   BILIDIR 0.1 06/12/2014 1359      Component Value Date/Time   TSH 2.180 09/10/2017 0942   TSH 2.62 05/12/2016 1612   TSH 1.87 06/12/2014 1359  Results for KRISSIA, SCHREIER (MRN [id number]) as of 10/17/2017 09:28  Ref. Range 09/10/2017 09:42  Vitamin D, 25-Hydroxy Latest Ref Range: 30.0 - 100.0 ng/mL 11.0 (L)    ASSESSMENT AND PLAN: Vitamin D deficiency - Plan: Vitamin D, Ergocalciferol, (DRISDOL) 50000 units CAPS capsule  Insulin resistance - Plan: metFORMIN (GLUCOPHAGE) 500 MG tablet  At risk for diabetes mellitus  Class 3 severe obesity with serious comorbidity and body mass index (BMI) of 45.0 to 49.9 in adult, unspecified obesity type ([place])  PLAN:  Vitamin D Deficiency Audrey Duncan was informed that low vitamin D levels contributes to fatigue and are associated with obesity, breast, and colon cancer. Audrey Duncan agrees to continue taking prescription Vit D @50 ,000 IU every week #4 and we will refill for 1 month. She will follow up for routine testing of vitamin D, at least 2-3 times per year. She was informed of the risk of over-replacement of vitamin D and agrees to not increase her dose unless she discusses this with [place] first. Audrey Duncan agrees to follow up with our clinic in 2 weeks.  Insulin Resistance Audrey Duncan will continue to work on weight loss, exercise, and decreasing simple carbohydrates in her diet to help decrease the risk of diabetes. We dicussed metformin including benefits and risks. She was informed that eating too many simple carbohydrates or too many calories at one sitting  increases the likelihood of GI side effects. Audrey Duncan agrees to continue taking metformin 500 mg q AM #30 and we will refill for 1 month. Audrey Duncan agrees to follow up with our clinic in 2 weeks as directed to monitor her progress.  Diabetes risk counselling Audrey Duncan was given extended (15 minutes) diabetes prevention counseling today. She is 57 y.o. female and has risk factors for diabetes including obesity and insulin resistance. We discussed intensive lifestyle modifications today with an emphasis on weight loss as well as increasing exercise and decreasing simple carbohydrates in her diet.  Obesity Audrey Duncan is currently in the action stage of change. As such, her goal is to continue with weight loss efforts She has agreed to follow the Category 2 plan with breakfast options Audrey Duncan has been instructed to work up to a goal of 150 minutes of combined cardio and strengthening exercise per week for weight loss and overall health benefits. We discussed the following Behavioral Modification Strategies today: increasing lean protein intake, decreasing simple carbohydrates  and dealing with family or coworker sabotage   Audrey Duncan has agreed to follow up with our clinic in 2 weeks. She was informed of the importance of frequent follow up visits to maximize her success with intensive lifestyle modifications for her multiple health conditions.   OBESITY BEHAVIORAL INTERVENTION VISIT  Today's visit was # 3 out of 22.  Starting weight: 279 lbs Starting date: 09/10/17 Today's weight : 271 lbs Today's date: 10/17/2017 Total lbs lost to date: 8 (Patients must lose 7 lbs in the first 6 months to continue with counseling)   ASK: We discussed the diagnosis of obesity with Audrey Duncan today and Audrey Duncan agreed to give [place] permission to discuss obesity behavioral modification therapy today.  ASSESS: Audrey Duncan has the diagnosis of obesity and her BMI today is 46.49 Audrey Duncan is in the action stage of  change   ADVISE:  Audrey Duncan was educated on the multiple health risks of obesity as well as the benefit of weight loss to improve her health. She was advised of the need for long term treatment and the importance of lifestyle modifications.  AGREE: Multiple dietary modification options and treatment options were discussed and  Audrey Duncan agreed to the above obesity treatment plan.  I, Trixie Dredge, am acting as transcriptionist for Dennard Nip, MD  I have reviewed the above documentation for accuracy and completeness, and I agree with the above. -Dennard Nip, MD

## 2017-11-01 ENCOUNTER — Ambulatory Visit (INDEPENDENT_AMBULATORY_CARE_PROVIDER_SITE_OTHER): Payer: 59 | Admitting: Family Medicine

## 2017-11-01 VITALS — BP 119/81 | HR 71 | Temp 98.3°F | Ht 64.0 in | Wt 271.0 lb

## 2017-11-01 DIAGNOSIS — Z6841 Body Mass Index (BMI) 40.0 and over, adult: Secondary | ICD-10-CM

## 2017-11-01 DIAGNOSIS — E8881 Metabolic syndrome: Secondary | ICD-10-CM

## 2017-11-01 NOTE — Progress Notes (Signed)
Office: [phone]  /  Fax: [phone]   HPI:   Chief Complaint: OBESITY Audrey Duncan is here to discuss her progress with her obesity treatment plan. She is on the Category 2 plan with breakfast options and is following her eating plan approximately 90 to 95 % of the time. She states she is exercising 0 minutes 0 times per week. [name] has done well with maintaining weight over Easter. [name] has eaten out a bit more, but she tried to make better choices. She is still working on eating all her food and not skipping meals. Her weight is 271 lb (122.9 kg) today and has maintained weight over a period of 2 weeks since her last visit. She has lost 8 lbs since starting treatment with [place].  Insulin Resistance [name] has a diagnosis of insulin resistance based on her elevated fasting insulin level >5. Although [name]'s blood glucose readings are still under good control, insulin resistance puts her at greater risk of metabolic syndrome and diabetes. [name] is on metformin currently. She had some initial GI upset, but this has resolved and polyphagia has improved. She is working on her diet and she continues to work on diet and exercise to decrease risk of diabetes.  ALLERGIES: No Known Allergies  MEDICATIONS: Current Outpatient Medications on File Prior to Visit  Medication Sig Dispense Refill  . hydrochlorothiazide (HYDRODIURIL) 25 MG tablet Take 1 tablet (25 mg total) by mouth daily. 90 tablet 1  . Magnesium 250 MG TABS Take 1 tablet by mouth daily.    . metFORMIN (GLUCOPHAGE) 500 MG tablet Take 1 tablet (500 mg total) by mouth daily with breakfast. 30 tablet 0  . polyethylene glycol powder (GLYCOLAX/MIRALAX) powder Take 17 g by mouth daily. 3350 g 0  . potassium chloride SA (K-DUR,KLOR-CON) 20 MEQ tablet TAKE 1 TABLET(20 MEQ) BY MOUTH DAILY 90 tablet 1  . Probiotic Product (ALIGN PO) Take 1 capsule by mouth daily.    . Vitamin D, Ergocalciferol, (DRISDOL) 50000 units CAPS capsule  Take 1 capsule (50,000 Units total) by mouth every 7 (seven) days. 4 capsule 0   Current Facility-Administered Medications on File Prior to Visit  Medication Dose Route Frequency Provider Last Rate Last Dose  . 0.9 %  sodium chloride infusion  500 mL Intravenous Continuous Doran Stabler, MD        PAST MEDICAL HISTORY: Past Medical History:  Diagnosis Date  . Asthma   . Chiari malformation type II ([place])    diagnosed when in high school  . Chiari malformation type II ([place])   . Colon polyp 2006   (TUBULAR ADENOMA)--COLONOSCOPY-DR. HAYES  . Constipation   . Fluid retention   . H/O seasonal allergies   . Medial meniscus tear    left  . Over weight   . Ulcerative colitis ([place])     PAST SURGICAL HISTORY: Past Surgical History:  Procedure Laterality Date  . ABDOMINAL HYSTERECTOMY  1999   complete  . [place]  . HERNIA REPAIR  [id number]   umbilical    SOCIAL HISTORY: Social History   Tobacco Use  . Smoking status: Never Smoker  . Smokeless tobacco: Never Used  Substance Use Topics  . Alcohol use: Yes    Comment: 1 glass of wine 1-2 x a month  . Drug use: No    FAMILY HISTORY: Family History  Problem Relation Age of Onset  . Heart disease Mother 54       2 MIs   .  Stroke Mother 80  . Hypertension Mother   . Cancer Mother        lung, lymphoma  . Lung cancer Mother        was a smoker  . Kidney disease Mother   . Obesity Mother   . COPD Father        smoker  . Cancer Father        colon, lung prostate  . Alcohol abuse Father   . Hypertension Father   . Obesity Father   . Asthma Sister   . Alcohol abuse Brother   . Diabetes Paternal Uncle     ROS: Review of Systems  Constitutional: Negative for weight loss.  Endo/Heme/Allergies:       Positive for polyphagia    PHYSICAL EXAM: Blood pressure 119/81, pulse 71, temperature 98.3 F (36.8 C), height 5\' 4"  (1.626 m), weight 271 lb (122.9 kg), SpO2 95 %. Body mass index is 46.52  kg/m. Physical Exam  Constitutional: She is oriented to person, place, and time. She appears well-developed and well-nourished.  Cardiovascular: Normal rate.  Pulmonary/Chest: Effort normal.  Musculoskeletal: Normal range of motion.  Neurological: She is oriented to person, place, and time.  Skin: Skin is warm and dry.  Psychiatric: She has a normal mood and affect. Her behavior is normal.  Vitals reviewed.   RECENT LABS AND TESTS: BMET    Component Value Date/Time   NA 141 09/10/2017 0942   K 3.9 09/10/2017 0942   CL 102 09/10/2017 0942   CO2 27 09/10/2017 0942   GLUCOSE 111 (H) 09/10/2017 0942   GLUCOSE 111 (H) 06/11/2017 0950   GLUCOSE 95 05/11/2006 1148   BUN 14 09/10/2017 0942   CREATININE 0.70 09/10/2017 0942   CREATININE 0.96 05/12/2016 1612   CALCIUM 9.2 09/10/2017 0942   GFRNONAA 96 09/10/2017 0942   GFRAA 110 09/10/2017 0942   Lab Results  Component Value Date   HGBA1C 5.3 09/10/2017   Lab Results  Component Value Date   INSULIN 39.9 (H) 09/10/2017   CBC    Component Value Date/Time   WBC 6.6 09/10/2017 0942   WBC 8.7 05/12/2016 1612   RBC 4.61 09/10/2017 0942   RBC 4.69 05/12/2016 1612   HGB 14.5 09/10/2017 0942   HCT 44.2 09/10/2017 0942   PLT 316 05/12/2016 1612   MCV 96 09/10/2017 0942   MCH 31.5 09/10/2017 0942   MCH 30.7 05/12/2016 1612   MCHC 32.8 09/10/2017 0942   MCHC 33.5 05/12/2016 1612   RDW 13.4 09/10/2017 0942   LYMPHSABS 1.7 09/10/2017 0942   MONOABS 696 05/12/2016 1612   EOSABS 0.2 09/10/2017 0942   BASOSABS 0.1 09/10/2017 0942   Iron/TIBC/Ferritin/ %Sat No results found for: IRON, TIBC, FERRITIN, IRONPCTSAT Lipid Panel     Component Value Date/Time   CHOL 144 09/10/2017 0942   TRIG 100 09/10/2017 0942   HDL 41 09/10/2017 0942   CHOLHDL 3 06/11/2017 0950   VLDL 14.8 06/11/2017 0950   LDLCALC 83 09/10/2017 0942   Hepatic Function Panel     Component Value Date/Time   PROT 6.6 09/10/2017 0942   ALBUMIN 4.3 09/10/2017  0942   AST 24 09/10/2017 0942   ALT 26 09/10/2017 0942   ALKPHOS 98 09/10/2017 0942   BILITOT 1.0 09/10/2017 0942   BILIDIR 0.1 06/12/2014 1359      Component Value Date/Time   TSH 2.180 09/10/2017 0942   TSH 2.62 05/12/2016 1612   TSH 1.87 06/12/2014 1359  Results for JAYCELYN, ORRISON (MRN [id number]) as of 11/01/2017 15:28  Ref. Range 09/10/2017 09:42  Vitamin D, 25-Hydroxy Latest Ref Range: 30.0 - 100.0 ng/mL 11.0 (L)   ASSESSMENT AND PLAN: Insulin resistance  Class 3 severe obesity with serious comorbidity and body mass index (BMI) of 45.0 to 49.9 in adult, unspecified obesity type ([place])  PLAN:  Insulin Resistance Xiadani will continue to work on weight loss, exercise, and decreasing simple carbohydrates in her diet to help decrease the risk of diabetes. We dicussed metformin including benefits and risks. She was informed that eating too many simple carbohydrates or too many calories at one sitting increases the likelihood of GI side effects. [name] will continue metformin and diet. We will recheck labs in 6 weeks and Cira agreed to follow up with [place] as directed to monitor her progress.  We spent > than 50% of the 15 minute visit on the counseling as documented in the note.  Obesity Shatara is currently in the action stage of change. As such, her goal is to continue with weight loss efforts She has agreed to follow the Category 2 plan with breakfast options Neelah has been instructed to work up to a goal of 150 minutes of combined cardio and strengthening exercise per week for weight loss and overall health benefits. We discussed the following Behavioral Modification Strategies today: no skipping meals, increasing lean protein intake and decreasing simple carbohydrates   Natoya has agreed to follow up with our clinic in 2 to 3 weeks. She was informed of the importance of frequent follow up visits to maximize her success with intensive lifestyle modifications for  her multiple health conditions.   OBESITY BEHAVIORAL INTERVENTION VISIT  Today's visit was # 4 out of 22.  Starting weight: 279 lbs Starting date: 09/10/17 Today's weight : 271 lbs Today's date: 11/01/2017 Total lbs lost to date: 8 (Patients must lose 7 lbs in the first 6 months to continue with counseling)   ASK: We discussed the diagnosis of obesity with Felipa Furnace today and Nefertiti agreed to give [place] permission to discuss obesity behavioral modification therapy today.  ASSESS: Tamlyn has the diagnosis of obesity and her BMI today is 46.49 Laketia is in the action stage of change   ADVISE: Vandana was educated on the multiple health risks of obesity as well as the benefit of weight loss to improve her health. She was advised of the need for long term treatment and the importance of lifestyle modifications.  AGREE: Multiple dietary modification options and treatment options were discussed and  Shakeila agreed to the above obesity treatment plan.  I, Doreene Nest, am acting as transcriptionist for Dennard Nip, MD  I have reviewed the above documentation for accuracy and completeness, and I agree with the above. -Dennard Nip, MD

## 2017-11-04 ENCOUNTER — Other Ambulatory Visit: Payer: Self-pay | Admitting: Family Medicine

## 2017-11-04 DIAGNOSIS — E876 Hypokalemia: Secondary | ICD-10-CM

## 2017-11-20 ENCOUNTER — Ambulatory Visit (INDEPENDENT_AMBULATORY_CARE_PROVIDER_SITE_OTHER): Payer: 59 | Admitting: Family Medicine

## 2017-11-20 VITALS — BP 111/77 | HR 81 | Temp 97.9°F | Ht 64.0 in | Wt 268.0 lb

## 2017-11-20 DIAGNOSIS — E559 Vitamin D deficiency, unspecified: Secondary | ICD-10-CM

## 2017-11-20 DIAGNOSIS — Z6841 Body Mass Index (BMI) 40.0 and over, adult: Secondary | ICD-10-CM

## 2017-11-20 DIAGNOSIS — E8881 Metabolic syndrome: Secondary | ICD-10-CM | POA: Diagnosis not present

## 2017-11-20 DIAGNOSIS — Z9189 Other specified personal risk factors, not elsewhere classified: Secondary | ICD-10-CM | POA: Diagnosis not present

## 2017-11-20 MED ORDER — METFORMIN HCL 500 MG PO TABS: 500.0000 mg | ORAL_TABLET | Freq: Every day | ORAL | 0 refills | Status: DC

## 2017-11-20 MED ORDER — VITAMIN D (ERGOCALCIFEROL) 1.25 MG (50000 UNIT) PO CAPS: 50000.0000 [IU] | ORAL_CAPSULE | ORAL | 0 refills | Status: DC

## 2017-11-20 NOTE — Progress Notes (Signed)
Office: [phone]  /  Fax: [phone]   HPI:   Chief Complaint: OBESITY Audrey Duncan is here to discuss her progress with her obesity treatment plan. She is on the Category 2 plan with breakfast options and is following her eating plan approximately 90 % of the time. She states she is walking and lifting weights for 30 minutes 3-4 times per week. Audrey Duncan continues to do well with weight loss on her Category 2 plan. She still struggles to remember to eat lunch due to decreased hunger and work schedule but has made strategies to help her remember and is doing better.  Her weight is 268 lb (121.6 kg) today and has had a weight loss of 3 pounds over a period of 2 to 3 weeks since her last visit. She has lost 11 lbs since starting treatment with [place].  Insulin Resistance Audrey Duncan has a diagnosis of insulin resistance based on her elevated fasting insulin level >5. Although Audrey Duncan's blood glucose readings are still under good control, insulin resistance puts her at greater risk of metabolic syndrome and diabetes. She is doing well with diet, metformin, and exercise. She denies nausea, vomiting, or hypoglycemia.  At risk for diabetes Audrey Duncan is at higher than average risk for developing diabetes due to her obesity and insulin resistance. She currently denies polyuria or polydipsia.  Vitamin D Deficiency Audrey Duncan has a diagnosis of vitamin D deficiency. She is stable on prescription Vit D, not yet at goal. She denies nausea, vomiting or muscle weakness.  ALLERGIES: No Known Allergies  MEDICATIONS: Current Outpatient Medications on File Prior to Visit  Medication Sig Dispense Refill  . hydrochlorothiazide (HYDRODIURIL) 25 MG tablet Take 1 tablet (25 mg total) by mouth daily. 90 tablet 1  . Magnesium 250 MG TABS Take 1 tablet by mouth daily.    . polyethylene glycol powder (GLYCOLAX/MIRALAX) powder Take 17 g by mouth daily. 3350 g 0  . potassium chloride SA (K-DUR,KLOR-CON) 20 MEQ tablet  TAKE 1 TABLET(20 MEQ) BY MOUTH DAILY 90 tablet 0  . Probiotic Product (ALIGN PO) Take 1 capsule by mouth daily.     Current Facility-Administered Medications on File Prior to Visit  Medication Dose Route Frequency Provider Last Rate Last Dose  . 0.9 %  sodium chloride infusion  500 mL Intravenous Continuous Doran Stabler, MD        PAST MEDICAL HISTORY: Past Medical History:  Diagnosis Date  . Asthma   . Chiari malformation type II ([place])    diagnosed when in high school  . Chiari malformation type II ([place])   . Colon polyp 2006   (TUBULAR ADENOMA)--COLONOSCOPY-DR. HAYES  . Constipation   . Fluid retention   . H/O seasonal allergies   . Medial meniscus tear    left  . Over weight   . Ulcerative colitis ([place])     PAST SURGICAL HISTORY: Past Surgical History:  Procedure Laterality Date  . ABDOMINAL HYSTERECTOMY  1999   complete  . [place]  . HERNIA REPAIR  [id number]   umbilical    SOCIAL HISTORY: Social History   Tobacco Use  . Smoking status: Never Smoker  . Smokeless tobacco: Never Used  Substance Use Topics  . Alcohol use: Yes    Comment: 1 glass of wine 1-2 x a month  . Drug use: No    FAMILY HISTORY: Family History  Problem Relation Age of Onset  . Heart disease Mother 67  2 MIs   . Stroke Mother 35  . Hypertension Mother   . Cancer Mother        lung, lymphoma  . Lung cancer Mother        was a smoker  . Kidney disease Mother   . Obesity Mother   . COPD Father        smoker  . Cancer Father        colon, lung prostate  . Alcohol abuse Father   . Hypertension Father   . Obesity Father   . Asthma Sister   . Alcohol abuse Brother   . Diabetes Paternal Uncle     ROS: Review of Systems  Constitutional: Positive for weight loss.  Gastrointestinal: Negative for nausea and vomiting.  Genitourinary: Negative for frequency.  Musculoskeletal:       Negative muscle weakness  Endo/Heme/Allergies: Negative for  polydipsia.       Negative hypoglycemia    PHYSICAL EXAM: Blood pressure 111/77, pulse 81, temperature 97.9 F (36.6 C), temperature source Oral, height 5\' 4"  (1.626 m), weight 268 lb (121.6 kg), SpO2 94 %. Body mass index is 46 kg/m. Physical Exam  Constitutional: She is oriented to person, place, and time. She appears well-developed and well-nourished.  Cardiovascular: Normal rate.  Pulmonary/Chest: Effort normal.  Musculoskeletal: Normal range of motion.  Neurological: She is oriented to person, place, and time.  Skin: Skin is warm and dry.  Psychiatric: She has a normal mood and affect. Her behavior is normal.  Vitals reviewed.   RECENT LABS AND TESTS: BMET    Component Value Date/Time   NA 141 09/10/2017 0942   K 3.9 09/10/2017 0942   CL 102 09/10/2017 0942   CO2 27 09/10/2017 0942   GLUCOSE 111 (H) 09/10/2017 0942   GLUCOSE 111 (H) 06/11/2017 0950   GLUCOSE 95 05/11/2006 1148   BUN 14 09/10/2017 0942   CREATININE 0.70 09/10/2017 0942   CREATININE 0.96 05/12/2016 1612   CALCIUM 9.2 09/10/2017 0942   GFRNONAA 96 09/10/2017 0942   GFRAA 110 09/10/2017 0942   Lab Results  Component Value Date   HGBA1C 5.3 09/10/2017   Lab Results  Component Value Date   INSULIN 39.9 (H) 09/10/2017   CBC    Component Value Date/Time   WBC 6.6 09/10/2017 0942   WBC 8.7 05/12/2016 1612   RBC 4.61 09/10/2017 0942   RBC 4.69 05/12/2016 1612   HGB 14.5 09/10/2017 0942   HCT 44.2 09/10/2017 0942   PLT 316 05/12/2016 1612   MCV 96 09/10/2017 0942   MCH 31.5 09/10/2017 0942   MCH 30.7 05/12/2016 1612   MCHC 32.8 09/10/2017 0942   MCHC 33.5 05/12/2016 1612   RDW 13.4 09/10/2017 0942   LYMPHSABS 1.7 09/10/2017 0942   MONOABS 696 05/12/2016 1612   EOSABS 0.2 09/10/2017 0942   BASOSABS 0.1 09/10/2017 0942   Iron/TIBC/Ferritin/ %Sat No results found for: IRON, TIBC, FERRITIN, IRONPCTSAT Lipid Panel     Component Value Date/Time   CHOL 144 09/10/2017 0942   TRIG 100  09/10/2017 0942   HDL 41 09/10/2017 0942   CHOLHDL 3 06/11/2017 0950   VLDL 14.8 06/11/2017 0950   LDLCALC 83 09/10/2017 0942   Hepatic Function Panel     Component Value Date/Time   PROT 6.6 09/10/2017 0942   ALBUMIN 4.3 09/10/2017 0942   AST 24 09/10/2017 0942   ALT 26 09/10/2017 0942   ALKPHOS 98 09/10/2017 0942   BILITOT 1.0 09/10/2017 0942  BILIDIR 0.1 06/12/2014 1359      Component Value Date/Time   TSH 2.180 09/10/2017 0942   TSH 2.62 05/12/2016 1612   TSH 1.87 06/12/2014 1359  Results for KENIDY, CROSSLAND (MRN [id number]) as of 11/20/2017 13:27  Ref. Range 09/10/2017 09:42  Vitamin D, 25-Hydroxy Latest Ref Range: 30.0 - 100.0 ng/mL 11.0 (L)    ASSESSMENT AND PLAN: Insulin resistance - Plan: metFORMIN (GLUCOPHAGE) 500 MG tablet  Vitamin D deficiency - Plan: Vitamin D, Ergocalciferol, (DRISDOL) 50000 units CAPS capsule  At risk for diabetes mellitus  Class 3 severe obesity with serious comorbidity and body mass index (BMI) of 45.0 to 49.9 in adult, unspecified obesity type ([place])  PLAN:  Insulin Resistance Niki will continue to work on weight loss, exercise, and decreasing simple carbohydrates in her diet to help decrease the risk of diabetes. We dicussed metformin including benefits and risks. She was informed that eating too many simple carbohydrates or too many calories at one sitting increases the likelihood of GI side effects. Audrey Duncan agrees to continue taking metformin 500 mg q AM #30 and we will refill for 1 month. Audrey Duncan agrees to follow up with our clinic in 2 to 3 weeks as directed to monitor her progress.  Diabetes risk counselling Audrey Duncan was given extended (15 minutes) diabetes prevention counseling today. She is 57 y.o. female and has risk factors for diabetes including obesity and insulin resistance. We discussed intensive lifestyle modifications today with an emphasis on weight loss as well as increasing exercise and decreasing simple  carbohydrates in her diet.  Vitamin D Deficiency Audrey Duncan was informed that low vitamin D levels contributes to fatigue and are associated with obesity, breast, and colon cancer. Audrey Duncan agrees to continue taking prescription Vit D @50 ,000 IU every week #4 and we will refill for 1 month. She will follow up for routine testing of vitamin D, at least 2-3 times per year. She was informed of the risk of over-replacement of vitamin D and agrees to not increase her dose unless she discusses this with [place] first. Audrey Duncan agrees to follow up with our clinic in 2 to 3 weeks.  Obesity Audrey Duncan is currently in the action stage of change. As such, her goal is to continue with weight loss efforts She has agreed to follow the Category 2 plan with breakfast oprions Audrey Duncan has been instructed to work up to a goal of 150 minutes of combined cardio and strengthening exercise per week for weight loss and overall health benefits. We discussed the following Behavioral Modification Strategies today: increasing lean protein intake and decreasing simple carbohydrates    Audrey Duncan has agreed to follow up with our clinic in 2 to 3 weeks. She was informed of the importance of frequent follow up visits to maximize her success with intensive lifestyle modifications for her multiple health conditions.   OBESITY BEHAVIORAL INTERVENTION VISIT  Today's visit was # 5 out of 22.  Starting weight: 279 lbs Starting date: 09/10/17 Today's weight : 268 lbs Today's date: 11/20/2017 Total lbs lost to date: 11 (Patients must lose 7 lbs in the first 6 months to continue with counseling)   ASK: We discussed the diagnosis of obesity with Audrey Duncan today and Audrey Duncan agreed to give [place] permission to discuss obesity behavioral modification therapy today.  ASSESS: Audrey Duncan has the diagnosis of obesity and her BMI today is 45.48 Audrey Duncan is in the action stage of change   ADVISE: Audrey Duncan was educated on the multiple  health risks of obesity  as well as the benefit of weight loss to improve her health. She was advised of the need for long term treatment and the importance of lifestyle modifications.  AGREE: Multiple dietary modification options and treatment options were discussed and  Audrey Duncan agreed to the above obesity treatment plan.  I, Trixie Dredge, am acting as transcriptionist for Dennard Nip, MD  I have reviewed the above documentation for accuracy and completeness, and I agree with the above. -Dennard Nip, MD

## 2017-12-13 ENCOUNTER — Ambulatory Visit (INDEPENDENT_AMBULATORY_CARE_PROVIDER_SITE_OTHER): Payer: 59 | Admitting: Family Medicine

## 2017-12-13 VITALS — BP 118/80 | HR 76 | Temp 97.6°F | Ht 64.0 in | Wt 268.0 lb

## 2017-12-13 DIAGNOSIS — E8881 Metabolic syndrome: Secondary | ICD-10-CM

## 2017-12-13 DIAGNOSIS — Z6841 Body Mass Index (BMI) 40.0 and over, adult: Secondary | ICD-10-CM

## 2017-12-13 DIAGNOSIS — E559 Vitamin D deficiency, unspecified: Secondary | ICD-10-CM | POA: Diagnosis not present

## 2017-12-13 DIAGNOSIS — E88819 Insulin resistance, unspecified: Secondary | ICD-10-CM

## 2017-12-13 DIAGNOSIS — E66813 Obesity, class 3: Secondary | ICD-10-CM

## 2017-12-13 DIAGNOSIS — Z9189 Other specified personal risk factors, not elsewhere classified: Secondary | ICD-10-CM

## 2017-12-13 DIAGNOSIS — F3289 Other specified depressive episodes: Secondary | ICD-10-CM | POA: Diagnosis not present

## 2017-12-13 MED ORDER — METFORMIN HCL 500 MG PO TABS: 500.0000 mg | ORAL_TABLET | Freq: Every day | ORAL | 0 refills | Status: DC

## 2017-12-13 MED ORDER — BUPROPION HCL ER (SR) 150 MG PO TB12: 150.0000 mg | ORAL_TABLET | Freq: Every day | ORAL | 0 refills | Status: DC

## 2017-12-13 MED ORDER — VITAMIN D (ERGOCALCIFEROL) 1.25 MG (50000 UNIT) PO CAPS: 50000.0000 [IU] | ORAL_CAPSULE | ORAL | 0 refills | Status: DC

## 2017-12-13 NOTE — Progress Notes (Signed)
Office: [phone]  /  Fax: [phone]   HPI:   Chief Complaint: OBESITY Audrey Duncan is here to discuss her progress with her obesity treatment plan. She is on the Category 2 plan with breakfast options and is following her eating plan approximately 80-85 % of the time. She states she is walking and weight training for 30 minutes 3-4 times per week. Audrey Duncan has done well maintaining weight but is struggling with meal prep and stress eating with increased work stress.  Her weight is 268 lb (121.6 kg) today and has not lost weight since her last visit. She has lost 11 lbs since starting treatment with [place].  Insulin Resistance Audrey Duncan has a diagnosis of insulin resistance based on her elevated fasting insulin level >5. Although Audrey Duncan's blood glucose readings are still under good control, insulin resistance puts her at greater risk of metabolic syndrome and diabetes. She is stable on metformin and she denies nausea, vomiting, or hypoglycemia, working on diet and exercise to decrease risk of diabetes.  At risk for diabetes Audrey Duncan is at higher than average risk for developing diabetes due to her obesity and insulin resistance. She currently denies polyuria or polydipsia.  Vitamin D Deficiency Audrey Duncan has a diagnosis of vitamin D deficiency. She is stable on prescription Vit D, not yet at goal. She denies nausea, vomiting or muscle weakness.  Depression with emotional eating behaviors Audrey Duncan notes increased work stress and feeling overwhelmed and not as organized, struggling with emotional eating and using food for comfort to the extent that it is negatively impacting her health. She often snacks when she is not hungry. Audrey Duncan sometimes feels she is out of control and then feels guilty that she made poor food choices. She has been working on behavior modification techniques to help reduce her emotional eating and has been somewhat successful. She shows no sign of suicidal or homicidal  ideations.  Depression screen [facility] 2/9 09/10/2017 06/11/2017 05/12/2016 01/06/2013  Decreased Interest 2 0 0 0  Down, Depressed, Hopeless 1 0 0 0  PHQ - 2 Score 3 0 0 0  Altered sleeping 2 - - -  Tired, decreased energy 3 - - -  Change in appetite 1 - - -  Feeling bad or failure about yourself  2 - - -  Trouble concentrating 0 - - -  Moving slowly or fidgety/restless 0 - - -  Suicidal thoughts 0 - - -  PHQ-9 Score 11 - - -  Difficult doing work/chores Not difficult at all - - -    ALLERGIES: No Known Allergies  MEDICATIONS: Current Outpatient Medications on File Prior to Visit  Medication Sig Dispense Refill  . hydrochlorothiazide (HYDRODIURIL) 25 MG tablet Take 1 tablet (25 mg total) by mouth daily. 90 tablet 1  . Magnesium 250 MG TABS Take 1 tablet by mouth daily.    . metFORMIN (GLUCOPHAGE) 500 MG tablet Take 1 tablet (500 mg total) by mouth daily with breakfast. 30 tablet 0  . polyethylene glycol powder (GLYCOLAX/MIRALAX) powder Take 17 g by mouth daily. 3350 g 0  . potassium chloride SA (K-DUR,KLOR-CON) 20 MEQ tablet TAKE 1 TABLET(20 MEQ) BY MOUTH DAILY 90 tablet 0  . Probiotic Product (ALIGN PO) Take 1 capsule by mouth daily.    . Vitamin D, Ergocalciferol, (DRISDOL) 50000 units CAPS capsule Take 1 capsule (50,000 Units total) by mouth every 7 (seven) days. 4 capsule 0   Current Facility-Administered Medications on File Prior to Visit  Medication Dose Route Frequency Provider Last  Rate Last Dose  . 0.9 %  sodium chloride infusion  500 mL Intravenous Continuous Doran Stabler, MD        PAST MEDICAL HISTORY: Past Medical History:  Diagnosis Date  . Asthma   . Chiari malformation type II ([place])    diagnosed when in high school  . Chiari malformation type II ([place])   . Colon polyp 2006   (TUBULAR ADENOMA)--COLONOSCOPY-DR. HAYES  . Constipation   . Fluid retention   . H/O seasonal allergies   . Medial meniscus tear    left  . Over weight   . Ulcerative colitis ([place])       PAST SURGICAL HISTORY: Past Surgical History:  Procedure Laterality Date  . ABDOMINAL HYSTERECTOMY  1999   complete  . [place]  . HERNIA REPAIR  [id number]   umbilical    SOCIAL HISTORY: Social History   Tobacco Use  . Smoking status: Never Smoker  . Smokeless tobacco: Never Used  Substance Use Topics  . Alcohol use: Yes    Comment: 1 glass of wine 1-2 x a month  . Drug use: No    FAMILY HISTORY: Family History  Problem Relation Age of Onset  . Heart disease Mother 30       2 MIs   . Stroke Mother 83  . Hypertension Mother   . Cancer Mother        lung, lymphoma  . Lung cancer Mother        was a smoker  . Kidney disease Mother   . Obesity Mother   . COPD Father        smoker  . Cancer Father        colon, lung prostate  . Alcohol abuse Father   . Hypertension Father   . Obesity Father   . Asthma Sister   . Alcohol abuse Brother   . Diabetes Paternal Uncle     ROS: Review of Systems  Constitutional: Negative for weight loss.  Gastrointestinal: Negative for nausea and vomiting.  Genitourinary: Negative for frequency.  Musculoskeletal:       Negative muscle weakness  Endo/Heme/Allergies: Negative for polydipsia.       Negative hypoglycemia  Psychiatric/Behavioral: Positive for depression. Negative for suicidal ideas.    PHYSICAL EXAM: Blood pressure 118/80, pulse 76, temperature 97.6 F (36.4 C), temperature source Oral, height 5\' 4"  (1.626 m), weight 268 lb (121.6 kg), SpO2 96 %. Body mass index is 46 kg/m. Physical Exam  Constitutional: She is oriented to person, place, and time. She appears well-developed and well-nourished.  Cardiovascular: Normal rate.  Pulmonary/Chest: Effort normal.  Musculoskeletal: Normal range of motion.  Neurological: She is oriented to person, place, and time.  Skin: Skin is warm and dry.  Psychiatric: She has a normal mood and affect. Her behavior is normal.  Vitals reviewed.   RECENT LABS  AND TESTS: BMET    Component Value Date/Time   NA 141 09/10/2017 0942   K 3.9 09/10/2017 0942   CL 102 09/10/2017 0942   CO2 27 09/10/2017 0942   GLUCOSE 111 (H) 09/10/2017 0942   GLUCOSE 111 (H) 06/11/2017 0950   GLUCOSE 95 05/11/2006 1148   BUN 14 09/10/2017 0942   CREATININE 0.70 09/10/2017 0942   CREATININE 0.96 05/12/2016 1612   CALCIUM 9.2 09/10/2017 0942   GFRNONAA 96 09/10/2017 0942   GFRAA 110 09/10/2017 0942   Lab Results  Component Value Date   HGBA1C 5.3  09/10/2017   Lab Results  Component Value Date   INSULIN 39.9 (H) 09/10/2017   CBC    Component Value Date/Time   WBC 6.6 09/10/2017 0942   WBC 8.7 05/12/2016 1612   RBC 4.61 09/10/2017 0942   RBC 4.69 05/12/2016 1612   HGB 14.5 09/10/2017 0942   HCT 44.2 09/10/2017 0942   PLT 316 05/12/2016 1612   MCV 96 09/10/2017 0942   MCH 31.5 09/10/2017 0942   MCH 30.7 05/12/2016 1612   MCHC 32.8 09/10/2017 0942   MCHC 33.5 05/12/2016 1612   RDW 13.4 09/10/2017 0942   LYMPHSABS 1.7 09/10/2017 0942   MONOABS 696 05/12/2016 1612   EOSABS 0.2 09/10/2017 0942   BASOSABS 0.1 09/10/2017 0942   Iron/TIBC/Ferritin/ %Sat No results found for: IRON, TIBC, FERRITIN, IRONPCTSAT Lipid Panel     Component Value Date/Time   CHOL 144 09/10/2017 0942   TRIG 100 09/10/2017 0942   HDL 41 09/10/2017 0942   CHOLHDL 3 06/11/2017 0950   VLDL 14.8 06/11/2017 0950   LDLCALC 83 09/10/2017 0942   Hepatic Function Panel     Component Value Date/Time   PROT 6.6 09/10/2017 0942   ALBUMIN 4.3 09/10/2017 0942   AST 24 09/10/2017 0942   ALT 26 09/10/2017 0942   ALKPHOS 98 09/10/2017 0942   BILITOT 1.0 09/10/2017 0942   BILIDIR 0.1 06/12/2014 1359      Component Value Date/Time   TSH 2.180 09/10/2017 0942   TSH 2.62 05/12/2016 1612   TSH 1.87 06/12/2014 1359  Results for Kehres, Shallon R (MRN [id number]) as of 12/13/2017 08:28  Ref. Range 09/10/2017 09:42  Vitamin D, 25-Hydroxy Latest Ref Range: 30.0 - 100.0 ng/mL 11.0 (L)      ASSESSMENT AND PLAN: Insulin resistance - Plan: metFORMIN (GLUCOPHAGE) 500 MG tablet  Vitamin D deficiency - Plan: Vitamin D, Ergocalciferol, (DRISDOL) 50000 units CAPS capsule  Other depression - with emotional eating - Plan: buPROPion (WELLBUTRIN SR) 150 MG 12 hr tablet  At risk for diabetes mellitus  Class 3 severe obesity with serious comorbidity and body mass index (BMI) of 45.0 to 49.9 in adult, unspecified obesity type ([place])  PLAN:  Insulin Resistance Audrey Duncan will continue to work on weight loss, exercise, and decreasing simple carbohydrates in her diet to help decrease the risk of diabetes. We dicussed metformin including benefits and risks. She was informed that eating too many simple carbohydrates or too many calories at one sitting increases the likelihood of GI side effects. Zaliah agrees to continue taking metformin 500 mg q AM #30 and we will refill for 1 month. Maytal agrees to follow up with our clinic in 2 to 3 weeks as directed to monitor her progress.  Diabetes risk counselling Audrey Duncan was given extended (15 minutes) diabetes prevention counseling today. She is 57 y.o. female and has risk factors for diabetes including obesity and insulin resistance. We discussed intensive lifestyle modifications today with an emphasis on weight loss as well as increasing exercise and decreasing simple carbohydrates in her diet.  Vitamin D Deficiency Audrey Duncan was informed that low vitamin D levels contributes to fatigue and are associated with obesity, breast, and colon cancer. Sussan agrees to continue taking prescription Vit D @50 ,000 IU every week #4 and we will refill for 1 month. She will follow up for routine testing of vitamin D, at least 2-3 times per year. She was informed of the risk of over-replacement of vitamin D and agrees to not increase her dose unless she discusses  this with [place] first. Lenisha agrees to follow up with our clinic in 2 to 3 weeks.  Depression with  Emotional Eating Behaviors We discussed behavior modification techniques today to help Audrey Duncan deal with her emotional eating and depression. Audrey Duncan agrees to start Wellbutrin SR 150 mg q AM #30 with no refills. Audrey Duncan agrees to follow up with our clinic in 2 to 3 weeks.  Obesity Audrey Duncan is currently in the action stage of change. As such, her goal is to continue with weight loss efforts She has agreed to follow the Category 2 plan Audrey Duncan has been instructed to work up to a goal of 150 minutes of combined cardio and strengthening exercise per week for weight loss and overall health benefits. We discussed the following Behavioral Modification Strategies today: work on meal planning and easy cooking plans, travel eating strategies, and emotional eating strategies   Audrey Duncan has agreed to follow up with our clinic in 2 to 3 weeks. She was informed of the importance of frequent follow up visits to maximize her success with intensive lifestyle modifications for her multiple health conditions.   OBESITY BEHAVIORAL INTERVENTION VISIT  Today's visit was # 6 out of 22.  Starting weight: 279 lbs Starting date: 09/10/17 Today's weight : 268 lbs Today's date: 12/13/2017 Total lbs lost to date: 11 (Patients must lose 7 lbs in the first 6 months to continue with counseling)   ASK: We discussed the diagnosis of obesity with Audrey Duncan today and Audrey Duncan agreed to give [place] permission to discuss obesity behavioral modification therapy today.  ASSESS: Audrey Duncan has the diagnosis of obesity and her BMI today is 45.98 Audrey Duncan is in the action stage of change   ADVISE: Audrey Duncan was educated on the multiple health risks of obesity as well as the benefit of weight loss to improve her health. She was advised of the need for long term treatment and the importance of lifestyle modifications.  AGREE: Multiple dietary modification options and treatment options were discussed and  Audrey Duncan agreed  to the above obesity treatment plan.  I, Trixie Dredge, am acting as transcriptionist for Dennard Nip, MD  I have reviewed the above documentation for accuracy and completeness, and I agree with the above. -Dennard Nip, MD

## 2017-12-31 ENCOUNTER — Encounter (INDEPENDENT_AMBULATORY_CARE_PROVIDER_SITE_OTHER): Payer: Self-pay | Admitting: Family Medicine

## 2018-01-01 ENCOUNTER — Encounter (INDEPENDENT_AMBULATORY_CARE_PROVIDER_SITE_OTHER): Payer: Self-pay | Admitting: Family Medicine

## 2018-01-01 ENCOUNTER — Other Ambulatory Visit (INDEPENDENT_AMBULATORY_CARE_PROVIDER_SITE_OTHER): Payer: Self-pay

## 2018-01-01 DIAGNOSIS — E8881 Metabolic syndrome: Secondary | ICD-10-CM

## 2018-01-01 DIAGNOSIS — F3289 Other specified depressive episodes: Secondary | ICD-10-CM

## 2018-01-01 DIAGNOSIS — E559 Vitamin D deficiency, unspecified: Secondary | ICD-10-CM

## 2018-01-01 MED ORDER — BUPROPION HCL ER (SR) 150 MG PO TB12: 150.0000 mg | ORAL_TABLET | Freq: Every day | ORAL | 0 refills | Status: DC

## 2018-01-01 MED ORDER — METFORMIN HCL 500 MG PO TABS: 500.0000 mg | ORAL_TABLET | Freq: Every day | ORAL | 0 refills | Status: DC

## 2018-01-01 MED ORDER — VITAMIN D (ERGOCALCIFEROL) 1.25 MG (50000 UNIT) PO CAPS
50000.0000 [IU] | ORAL_CAPSULE | ORAL | 0 refills | Status: DC
Start: 2018-01-01 — End: 2018-02-04

## 2018-01-03 ENCOUNTER — Ambulatory Visit (INDEPENDENT_AMBULATORY_CARE_PROVIDER_SITE_OTHER): Payer: 59 | Admitting: Family Medicine

## 2018-01-08 DIAGNOSIS — D1801 Hemangioma of skin and subcutaneous tissue: Secondary | ICD-10-CM | POA: Diagnosis not present

## 2018-01-08 DIAGNOSIS — D225 Melanocytic nevi of trunk: Secondary | ICD-10-CM | POA: Diagnosis not present

## 2018-01-08 DIAGNOSIS — L304 Erythema intertrigo: Secondary | ICD-10-CM | POA: Diagnosis not present

## 2018-01-08 DIAGNOSIS — L814 Other melanin hyperpigmentation: Secondary | ICD-10-CM | POA: Diagnosis not present

## 2018-01-08 DIAGNOSIS — L821 Other seborrheic keratosis: Secondary | ICD-10-CM | POA: Diagnosis not present

## 2018-01-08 DIAGNOSIS — L918 Other hypertrophic disorders of the skin: Secondary | ICD-10-CM | POA: Diagnosis not present

## 2018-01-23 ENCOUNTER — Ambulatory Visit (INDEPENDENT_AMBULATORY_CARE_PROVIDER_SITE_OTHER): Payer: 59 | Admitting: Family Medicine

## 2018-01-23 ENCOUNTER — Encounter (INDEPENDENT_AMBULATORY_CARE_PROVIDER_SITE_OTHER): Payer: Self-pay

## 2018-01-24 ENCOUNTER — Other Ambulatory Visit: Payer: Self-pay | Admitting: Family Medicine

## 2018-01-24 DIAGNOSIS — E876 Hypokalemia: Secondary | ICD-10-CM

## 2018-01-25 ENCOUNTER — Other Ambulatory Visit: Payer: Self-pay | Admitting: Family Medicine

## 2018-01-28 NOTE — Telephone Encounter (Signed)
Author phoned pt. Re: scheduling appointment as last OV with PCP 06/2017. Pt. Stated she will schedule an appointment when she returns from her vacation. Potassium refilled.

## 2018-02-04 ENCOUNTER — Ambulatory Visit (INDEPENDENT_AMBULATORY_CARE_PROVIDER_SITE_OTHER): Payer: 59 | Admitting: Physician Assistant

## 2018-02-04 VITALS — BP 107/74 | HR 75 | Temp 97.6°F | Ht 64.0 in | Wt 266.0 lb

## 2018-02-04 DIAGNOSIS — E559 Vitamin D deficiency, unspecified: Secondary | ICD-10-CM

## 2018-02-04 DIAGNOSIS — E88819 Insulin resistance, unspecified: Secondary | ICD-10-CM

## 2018-02-04 DIAGNOSIS — Z9189 Other specified personal risk factors, not elsewhere classified: Secondary | ICD-10-CM | POA: Diagnosis not present

## 2018-02-04 DIAGNOSIS — E8881 Metabolic syndrome: Secondary | ICD-10-CM | POA: Diagnosis not present

## 2018-02-04 DIAGNOSIS — F3289 Other specified depressive episodes: Secondary | ICD-10-CM

## 2018-02-04 DIAGNOSIS — Z6841 Body Mass Index (BMI) 40.0 and over, adult: Secondary | ICD-10-CM

## 2018-02-04 MED ORDER — VITAMIN D (ERGOCALCIFEROL) 1.25 MG (50000 UNIT) PO CAPS: 50000.0000 [IU] | ORAL_CAPSULE | ORAL | 0 refills | Status: DC

## 2018-02-04 MED ORDER — METFORMIN HCL 500 MG PO TABS: 500.0000 mg | ORAL_TABLET | Freq: Every day | ORAL | 0 refills | Status: DC

## 2018-02-04 MED ORDER — BUPROPION HCL ER (SR) 150 MG PO TB12: 150.0000 mg | ORAL_TABLET | Freq: Every day | ORAL | 0 refills | Status: DC

## 2018-02-04 NOTE — Progress Notes (Signed)
Office: [phone]  /  Fax: [phone]   HPI:   Chief Complaint: OBESITY Audrey Duncan is here to discuss her progress with her obesity treatment plan. She is on the Category 2 plan and is following her eating plan approximately 60 % of the time of the past 2 weeks and 90 to 95% for the 4 weeks prior. She states she is walking 15 minutes 3 to 4 times per week. Tequlia did very well with weight loss. She has been on vacation and reports eating a lot of seafood and doing a lot of walking. Her weight is 266 lb (120.7 kg) today and has had a weight loss of 2 pounds over a period of 7 weeks since her last visit. She has lost 13 lbs since starting treatment with [place].  Insulin Resistance Audrey Duncan has a diagnosis of insulin resistance based on her elevated fasting insulin level >5. Although Audrey Duncan's blood glucose readings are still under good control, insulin resistance puts her at greater risk of metabolic syndrome and diabetes. She is taking metformin currently and continues to work on diet and exercise to decrease risk of diabetes. She denies nausea, vomiting, diarrhea or hypoglycemia.  At risk for diabetes Audrey Duncan is at higher than average risk for developing diabetes due to her obesity and insulin resistance. She currently denies polyuria or polydipsia.  Depression with emotional eating behaviors Audrey Duncan is on bupropion currently. Her blood pressure is normal. Audrey Duncan denies cravings. She struggles with emotional eating and using food for comfort to the extent that it is negatively impacting her health. She often snacks when she is not hungry. Audrey Duncan sometimes feels she is out of control and then feels guilty that she made poor food choices. She has been working on behavior modification techniques to help reduce her emotional eating and has been somewhat successful. She shows no sign of suicidal or homicidal ideations.  Depression screen [facility] 2/9 09/10/2017 06/11/2017 05/12/2016 01/06/2013    Decreased Interest 2 0 0 0  Down, Depressed, Hopeless 1 0 0 0  PHQ - 2 Score 3 0 0 0  Altered sleeping 2 - - -  Tired, decreased energy 3 - - -  Change in appetite 1 - - -  Feeling bad or failure about yourself  2 - - -  Trouble concentrating 0 - - -  Moving slowly or fidgety/restless 0 - - -  Suicidal thoughts 0 - - -  PHQ-9 Score 11 - - -  Difficult doing work/chores Not difficult at all - - -     ALLERGIES: No Known Allergies  MEDICATIONS: Current Outpatient Medications on File Prior to Visit  Medication Sig Dispense Refill  . hydrochlorothiazide (HYDRODIURIL) 25 MG tablet Take 1 tablet (25 mg total) by mouth daily. 30 tablet 0  . Magnesium 250 MG TABS Take 1 tablet by mouth daily.    . polyethylene glycol powder (GLYCOLAX/MIRALAX) powder Take 17 g by mouth daily. 3350 g 0  . potassium chloride SA (K-DUR,KLOR-CON) 20 MEQ tablet TAKE 1 TABLET(20 MEQ) BY MOUTH DAILY 90 tablet 0  . Probiotic Product (ALIGN PO) Take 1 capsule by mouth daily.     Current Facility-Administered Medications on File Prior to Visit  Medication Dose Route Frequency Provider Last Rate Last Dose  . 0.9 %  sodium chloride infusion  500 mL Intravenous Continuous Doran Stabler, MD        PAST MEDICAL HISTORY: Past Medical History:  Diagnosis Date  . Asthma   . Chiari malformation  type II ([place])    diagnosed when in high school  . Chiari malformation type II ([place])   . Colon polyp 2006   (TUBULAR ADENOMA)--COLONOSCOPY-DR. HAYES  . Constipation   . Fluid retention   . H/O seasonal allergies   . Medial meniscus tear    left  . Over weight   . Ulcerative colitis ([place])     PAST SURGICAL HISTORY: Past Surgical History:  Procedure Laterality Date  . ABDOMINAL HYSTERECTOMY  1999   complete  . [place]  . HERNIA REPAIR  [id number]   umbilical    SOCIAL HISTORY: Social History   Tobacco Use  . Smoking status: Never Smoker  . Smokeless tobacco: Never Used  Substance Use  Topics  . Alcohol use: Yes    Comment: 1 glass of wine 1-2 x a month  . Drug use: No    FAMILY HISTORY: Family History  Problem Relation Age of Onset  . Heart disease Mother 76       2 MIs   . Stroke Mother 31  . Hypertension Mother   . Cancer Mother        lung, lymphoma  . Lung cancer Mother        was a smoker  . Kidney disease Mother   . Obesity Mother   . COPD Father        smoker  . Cancer Father        colon, lung prostate  . Alcohol abuse Father   . Hypertension Father   . Obesity Father   . Asthma Sister   . Alcohol abuse Brother   . Diabetes Paternal Uncle     ROS: Review of Systems  Constitutional: Positive for weight loss.  Gastrointestinal: Negative for diarrhea, nausea and vomiting.  Genitourinary: Negative for frequency.  Endo/Heme/Allergies: Negative for polydipsia.       Negative for hypoglycemia   Psychiatric/Behavioral: Positive for depression. Negative for suicidal ideas.       Negative for cravings    PHYSICAL EXAM: Blood pressure 107/74, pulse 75, temperature 97.6 F (36.4 C), temperature source Oral, height 5\' 4"  (1.626 m), weight 266 lb (120.7 kg), SpO2 (!) 75 %. Body mass index is 45.66 kg/m. Physical Exam  Constitutional: She is oriented to person, place, and time. She appears well-developed and well-nourished.  Cardiovascular: Normal rate.  Pulmonary/Chest: Effort normal.  Musculoskeletal: Normal range of motion.  Neurological: She is oriented to person, place, and time.  Skin: Skin is warm and dry.  Psychiatric: She has a normal mood and affect. Her behavior is normal.  Vitals reviewed.   RECENT LABS AND TESTS: BMET    Component Value Date/Time   NA 141 09/10/2017 0942   K 3.9 09/10/2017 0942   CL 102 09/10/2017 0942   CO2 27 09/10/2017 0942   GLUCOSE 111 (H) 09/10/2017 0942   GLUCOSE 111 (H) 06/11/2017 0950   GLUCOSE 95 05/11/2006 1148   BUN 14 09/10/2017 0942   CREATININE 0.70 09/10/2017 0942   CREATININE 0.96  05/12/2016 1612   CALCIUM 9.2 09/10/2017 0942   GFRNONAA 96 09/10/2017 0942   GFRAA 110 09/10/2017 0942   Lab Results  Component Value Date   HGBA1C 5.3 09/10/2017   Lab Results  Component Value Date   INSULIN 39.9 (H) 09/10/2017   CBC    Component Value Date/Time   WBC 6.6 09/10/2017 0942   WBC 8.7 05/12/2016 1612   RBC 4.61 09/10/2017 0942  RBC 4.69 05/12/2016 1612   HGB 14.5 09/10/2017 0942   HCT 44.2 09/10/2017 0942   PLT 316 05/12/2016 1612   MCV 96 09/10/2017 0942   MCH 31.5 09/10/2017 0942   MCH 30.7 05/12/2016 1612   MCHC 32.8 09/10/2017 0942   MCHC 33.5 05/12/2016 1612   RDW 13.4 09/10/2017 0942   LYMPHSABS 1.7 09/10/2017 0942   MONOABS 696 05/12/2016 1612   EOSABS 0.2 09/10/2017 0942   BASOSABS 0.1 09/10/2017 0942   Iron/TIBC/Ferritin/ %Sat No results found for: IRON, TIBC, FERRITIN, IRONPCTSAT Lipid Panel     Component Value Date/Time   CHOL 144 09/10/2017 0942   TRIG 100 09/10/2017 0942   HDL 41 09/10/2017 0942   CHOLHDL 3 06/11/2017 0950   VLDL 14.8 06/11/2017 0950   LDLCALC 83 09/10/2017 0942   Hepatic Function Panel     Component Value Date/Time   PROT 6.6 09/10/2017 0942   ALBUMIN 4.3 09/10/2017 0942   AST 24 09/10/2017 0942   ALT 26 09/10/2017 0942   ALKPHOS 98 09/10/2017 0942   BILITOT 1.0 09/10/2017 0942   BILIDIR 0.1 06/12/2014 1359      Component Value Date/Time   TSH 2.180 09/10/2017 0942   TSH 2.62 05/12/2016 1612   TSH 1.87 06/12/2014 1359   Results for Darroch, Christian R (MRN [id number]) as of 02/04/2018 17:48  Ref. Range 09/10/2017 09:42  Vitamin D, 25-Hydroxy Latest Ref Range: 30.0 - 100.0 ng/mL 11.0 (L)   ASSESSMENT AND PLAN: Insulin resistance - Plan: Comprehensive metabolic panel, Insulin, random, metFORMIN (GLUCOPHAGE) 500 MG tablet  Vitamin D deficiency - Plan: VITAMIN D 25 Hydroxy (Vit-D Deficiency, Fractures), Vitamin D, Ergocalciferol, (DRISDOL) 50000 units CAPS capsule  Other depression - with emotional eating  - Plan: buPROPion (WELLBUTRIN SR) 150 MG 12 hr tablet  At risk for diabetes mellitus  Class 3 severe obesity with serious comorbidity and body mass index (BMI) of 45.0 to 49.9 in adult, unspecified obesity type (HCC)  PLAN:  Insulin Resistance [place]ee will continue to work on weight loss, exercise, and decreasing simple carbohydrates in her diet to help decrease the risk of diabetes. We dicussed metformin including benefits and risks. She was informed that eating too many simple carbohydrates or too many calories at one sitting increases the likelihood of GI side effects. Anderson Malta requested metformin for now and prescription was written today for 1 month refill. Emil agreed to follow up with [place] as directed to monitor her progress.  Diabetes risk counseling Chasya was given extended (15 minutes) diabetes prevention counseling today. She is 57 y.o. female and has risk factors for diabetes including obesity and insulin resistance. We discussed intensive lifestyle modifications today with an emphasis on weight loss as well as increasing exercise and decreasing simple carbohydrates in her diet.  Depression with Emotional Eating Behaviors We discussed behavior modification techniques today to help Kashauna deal with her emotional eating and depression. She has agreed to continue bupropion SR 150 mg qd #30 with no refills and follow up as directed.  Obesity Kirsty is currently in the action stage of change. As such, her goal is to continue with weight loss efforts She has agreed to follow the Category 2 plan Dale has been instructed to work up to a goal of 150 minutes of combined cardio and strengthening exercise per week for weight loss and overall health benefits. We discussed the following Behavioral Modification Strategies today: work on meal planning and easy cooking plans and ways to avoid boredom eating  Anderson Malta  has agreed to follow up with our clinic in 2 to 3 weeks. She was  informed of the importance of frequent follow up visits to maximize her success with intensive lifestyle modifications for her multiple health conditions.   OBESITY BEHAVIORAL INTERVENTION VISIT  Today's visit was # 7 out of 22.  Starting weight: 279 lbs Starting date: 09/10/17 Today's weight : 266 lbs  Today's date: 02/04/2018 Total lbs lost to date: 13    ASK: We discussed the diagnosis of obesity with Felipa Furnace today and Tameka agreed to give [place] permission to discuss obesity behavioral modification therapy today.  ASSESS: Henryetta has the diagnosis of obesity and her BMI today is 45.64 Verdie is in the action stage of change   ADVISE: Lindsy was educated on the multiple health risks of obesity as well as the benefit of weight loss to improve her health. She was advised of the need for long term treatment and the importance of lifestyle modifications.  AGREE: Multiple dietary modification options and treatment options were discussed and  Cayle agreed to the above obesity treatment plan.  IDoreene Nest, am acting as transcriptionist for [employer], PA-C

## 2018-02-05 LAB — COMPREHENSIVE METABOLIC PANEL WITH GFR
ALT: 34 IU/L — ABNORMAL HIGH (ref 0–32)
AST: 33 IU/L (ref 0–40)
Albumin/Globulin Ratio: 1.9 (ref 1.2–2.2)
Albumin: 4.1 g/dL (ref 3.5–5.5)
Alkaline Phosphatase: 102 IU/L (ref 39–117)
BUN/Creatinine Ratio: 20 (ref 9–23)
BUN: 17 mg/dL (ref 6–24)
Bilirubin Total: 0.8 mg/dL (ref 0.0–1.2)
CO2: 26 mmol/L (ref 20–29)
Calcium: 9.3 mg/dL (ref 8.7–10.2)
Chloride: 102 mmol/L (ref 96–106)
Creatinine, Ser: 0.86 mg/dL (ref 0.57–1.00)
GFR calc Af Amer: 86 mL/min/1.73
GFR calc non Af Amer: 74 mL/min/1.73
Globulin, Total: 2.2 g/dL (ref 1.5–4.5)
Glucose: 102 mg/dL — ABNORMAL HIGH (ref 65–99)
Potassium: 4.2 mmol/L (ref 3.5–5.2)
Sodium: 143 mmol/L (ref 134–144)
Total Protein: 6.3 g/dL (ref 6.0–8.5)

## 2018-02-05 LAB — INSULIN, RANDOM: INSULIN: 27.9 u[IU]/mL — ABNORMAL HIGH (ref 2.6–24.9)

## 2018-02-05 LAB — VITAMIN D 25 HYDROXY (VIT D DEFICIENCY, FRACTURES): Vit D, 25-Hydroxy: 42.7 ng/mL (ref 30.0–100.0)

## 2018-02-07 ENCOUNTER — Ambulatory Visit (INDEPENDENT_AMBULATORY_CARE_PROVIDER_SITE_OTHER): Payer: 59 | Admitting: Physician Assistant

## 2018-02-19 ENCOUNTER — Other Ambulatory Visit: Payer: Self-pay | Admitting: Family Medicine

## 2018-02-19 DIAGNOSIS — Z1231 Encounter for screening mammogram for malignant neoplasm of breast: Secondary | ICD-10-CM

## 2018-02-27 ENCOUNTER — Ambulatory Visit (INDEPENDENT_AMBULATORY_CARE_PROVIDER_SITE_OTHER): Payer: 59 | Admitting: Physician Assistant

## 2018-02-27 VITALS — BP 119/81 | HR 78 | Temp 97.5°F | Ht 64.0 in | Wt 266.0 lb

## 2018-02-27 DIAGNOSIS — Z9189 Other specified personal risk factors, not elsewhere classified: Secondary | ICD-10-CM | POA: Diagnosis not present

## 2018-02-27 DIAGNOSIS — E559 Vitamin D deficiency, unspecified: Secondary | ICD-10-CM

## 2018-02-27 DIAGNOSIS — Z6841 Body Mass Index (BMI) 40.0 and over, adult: Secondary | ICD-10-CM

## 2018-02-27 DIAGNOSIS — I10 Essential (primary) hypertension: Secondary | ICD-10-CM | POA: Diagnosis not present

## 2018-02-27 MED ORDER — HYDROCHLOROTHIAZIDE 25 MG PO TABS: 25.0000 mg | ORAL_TABLET | Freq: Every day | ORAL | 0 refills | Status: DC

## 2018-02-27 MED ORDER — VITAMIN D (ERGOCALCIFEROL) 1.25 MG (50000 UNIT) PO CAPS: 50000.0000 [IU] | ORAL_CAPSULE | ORAL | 0 refills | Status: DC

## 2018-02-27 NOTE — Progress Notes (Signed)
Office: [phone]  /  Fax: [phone]   HPI:   Chief Complaint: OBESITY Audrey Duncan is here to discuss her progress with her obesity treatment plan. She is on the Category 2 plan and is following her eating plan approximately 97-98 % of the time. She states she is walking for 30 minutes 3-4 times per week. Audrey Duncan did well with weight maintenance. She reports that she has been getting in all the protein on the plan most days. She is traveling to the beach the next 2 weeks.  Her weight is 266 lb (120.7 kg) today and has not lost weight since her last visit. She has lost 13 lbs since starting treatment with [place].  Vitamin D Deficiency Audrey Duncan has a diagnosis of vitamin D deficiency. She is on prescription Vit D and denies nausea, vomiting or muscle weakness.  At risk for osteopenia and osteoporosis Audrey Duncan is at higher risk of osteopenia and osteoporosis due to vitamin D deficiency.   Hypertension Audrey Duncan is a 57 y.o. female with hypertension. Audrey Duncan's blood pressure is controlled and she denies chest pain. She is working weight loss to help control her blood pressure with the goal of decreasing her risk of heart attack and stroke.   ALLERGIES: No Known Allergies  MEDICATIONS: Current Outpatient Medications on File Prior to Visit  Medication Sig Dispense Refill  . buPROPion (WELLBUTRIN SR) 150 MG 12 hr tablet Take 1 tablet (150 mg total) by mouth daily. 30 tablet 0  . hydrochlorothiazide (HYDRODIURIL) 25 MG tablet Take 1 tablet (25 mg total) by mouth daily. 30 tablet 0  . Magnesium 250 MG TABS Take 1 tablet by mouth daily.    . metFORMIN (GLUCOPHAGE) 500 MG tablet Take 1 tablet (500 mg total) by mouth daily with breakfast. 30 tablet 0  . polyethylene glycol powder (GLYCOLAX/MIRALAX) powder Take 17 g by mouth daily. 3350 g 0  . potassium chloride SA (K-DUR,KLOR-CON) 20 MEQ tablet TAKE 1 TABLET(20 MEQ) BY MOUTH DAILY 90 tablet 0  . Probiotic Product (ALIGN PO) Take 1  capsule by mouth daily.    . Vitamin D, Ergocalciferol, (DRISDOL) 50000 units CAPS capsule Take 1 capsule (50,000 Units total) by mouth every 7 (seven) days. 4 capsule 0   Current Facility-Administered Medications on File Prior to Visit  Medication Dose Route Frequency Provider Last Rate Last Dose  . 0.9 %  sodium chloride infusion  500 mL Intravenous Continuous Doran Stabler, MD        PAST MEDICAL HISTORY: Past Medical History:  Diagnosis Date  . Asthma   . Chiari malformation type II ([place])    diagnosed when in high school  . Chiari malformation type II ([place])   . Colon polyp 2006   (TUBULAR ADENOMA)--COLONOSCOPY-DR. HAYES  . Constipation   . Fluid retention   . H/O seasonal allergies   . Medial meniscus tear    left  . Over weight   . Ulcerative colitis ([place])     PAST SURGICAL HISTORY: Past Surgical History:  Procedure Laterality Date  . ABDOMINAL HYSTERECTOMY  1999   complete  . [place]  . HERNIA REPAIR  [id number]   umbilical    SOCIAL HISTORY: Social History   Tobacco Use  . Smoking status: Never Smoker  . Smokeless tobacco: Never Used  Substance Use Topics  . Alcohol use: Yes    Comment: 1 glass of wine 1-2 x a month  . Drug use: No  FAMILY HISTORY: Family History  Problem Relation Age of Onset  . Heart disease Mother 57       2 MIs   . Stroke Mother 36  . Hypertension Mother   . Cancer Mother        lung, lymphoma  . Lung cancer Mother        was a smoker  . Kidney disease Mother   . Obesity Mother   . COPD Father        smoker  . Cancer Father        colon, lung prostate  . Alcohol abuse Father   . Hypertension Father   . Obesity Father   . Asthma Sister   . Alcohol abuse Brother   . Diabetes Paternal Uncle     ROS: Review of Systems  Constitutional: Negative for weight loss.  Cardiovascular: Negative for chest pain.  Gastrointestinal: Negative for nausea and vomiting.  Musculoskeletal:       Negative  muscle weakness    PHYSICAL EXAM: Blood pressure 119/81, pulse 78, temperature (!) 97.5 F (36.4 C), temperature source Oral, height 5\' 4"  (1.626 m), weight 266 lb (120.7 kg), SpO2 93 %. Body mass index is 45.66 kg/m. Physical Exam  Constitutional: She is oriented to person, place, and time. She appears well-developed and well-nourished.  Cardiovascular: Normal rate.  Pulmonary/Chest: Effort normal.  Musculoskeletal: Normal range of motion.  Neurological: She is oriented to person, place, and time.  Skin: Skin is warm and dry.  Psychiatric: She has a normal mood and affect. Her behavior is normal.  Vitals reviewed.   RECENT LABS AND TESTS: BMET    Component Value Date/Time   NA 143 02/04/2018 0911   K 4.2 02/04/2018 0911   CL 102 02/04/2018 0911   CO2 26 02/04/2018 0911   GLUCOSE 102 (H) 02/04/2018 0911   GLUCOSE 111 (H) 06/11/2017 0950   GLUCOSE 95 05/11/2006 1148   BUN 17 02/04/2018 0911   CREATININE 0.86 02/04/2018 0911   CREATININE 0.96 05/12/2016 1612   CALCIUM 9.3 02/04/2018 0911   GFRNONAA 74 02/04/2018 0911   GFRAA 86 02/04/2018 0911   Lab Results  Component Value Date   HGBA1C 5.3 09/10/2017   Lab Results  Component Value Date   INSULIN 27.9 (H) 02/04/2018   INSULIN 39.9 (H) 09/10/2017   CBC    Component Value Date/Time   WBC 6.6 09/10/2017 0942   WBC 8.7 05/12/2016 1612   RBC 4.61 09/10/2017 0942   RBC 4.69 05/12/2016 1612   HGB 14.5 09/10/2017 0942   HCT 44.2 09/10/2017 0942   PLT 316 05/12/2016 1612   MCV 96 09/10/2017 0942   MCH 31.5 09/10/2017 0942   MCH 30.7 05/12/2016 1612   MCHC 32.8 09/10/2017 0942   MCHC 33.5 05/12/2016 1612   RDW 13.4 09/10/2017 0942   LYMPHSABS 1.7 09/10/2017 0942   MONOABS 696 05/12/2016 1612   EOSABS 0.2 09/10/2017 0942   BASOSABS 0.1 09/10/2017 0942   Iron/TIBC/Ferritin/ %Sat No results found for: IRON, TIBC, FERRITIN, IRONPCTSAT Lipid Panel     Component Value Date/Time   CHOL 144 09/10/2017 0942   TRIG  100 09/10/2017 0942   HDL 41 09/10/2017 0942   CHOLHDL 3 06/11/2017 0950   VLDL 14.8 06/11/2017 0950   LDLCALC 83 09/10/2017 0942   Hepatic Function Panel     Component Value Date/Time   PROT 6.3 02/04/2018 0911   ALBUMIN 4.1 02/04/2018 0911   AST 33 02/04/2018 0911  ALT 34 (H) 02/04/2018 0911   ALKPHOS 102 02/04/2018 0911   BILITOT 0.8 02/04/2018 0911   BILIDIR 0.1 06/12/2014 1359      Component Value Date/Time   TSH 2.180 09/10/2017 0942   TSH 2.62 05/12/2016 1612   TSH 1.87 06/12/2014 1359  Results for SMRITHI, PIGFORD (MRN [id number]) as of 02/27/2018 08:23  Ref. Range 02/04/2018 09:11  Vitamin D, 25-Hydroxy Latest Ref Range: 30.0 - 100.0 ng/mL 42.7    ASSESSMENT AND PLAN: Vitamin D deficiency - Plan: Vitamin D, Ergocalciferol, (DRISDOL) 50000 units CAPS capsule  Essential hypertension - Plan: hydrochlorothiazide (HYDRODIURIL) 25 MG tablet  At risk for osteoporosis  Class 3 severe obesity with serious comorbidity and body mass index (BMI) of 45.0 to 49.9 in adult, unspecified obesity type ([place])  PLAN:  Vitamin D Deficiency Audrey Duncan was informed that low vitamin D levels contributes to fatigue and are associated with obesity, breast, and colon cancer. Audrey Duncan agrees to continue taking prescription Vit D @50 ,000 IU every week #4 and we will refill for 1 month. She will follow up for routine testing of vitamin D, at least 2-3 times per year. She was informed of the risk of over-replacement of vitamin D and agrees to not increase her dose unless she discusses this with [place] first. Audrey Duncan agrees to follow up with our clinic in 3 weeks.  At risk for osteopenia and osteoporosis Audrey Duncan is at risk for osteopenia and osteoporsis due to her vitamin D deficiency. She was encouraged to take her vitamin D and follow her higher calcium diet and increase strengthening exercise to help strengthen her bones and decrease her risk of osteopenia and osteoporosis.  Hypertension We  discussed sodium restriction, working on healthy weight loss, and a regular exercise program as the means to achieve improved blood pressure control. Audrey Duncan agreed with this plan and agreed to follow up as directed. We will continue to monitor her blood pressure as well as her progress with the above lifestyle modifications. Audrey Duncan agrees to continue taking hydrochlorothiazide 25 mg qd #30 and we will refill for 1 month. She will watch for signs of hypotension as she continues her lifestyle modifications. Audrey Duncan agrees to follow up with our clinic in 3 weeks.  Obesity Audrey Duncan is currently in the action stage of change. As such, her goal is to continue with weight loss efforts She has agreed to follow the Category 2 plan Audrey Duncan has been instructed to work up to a goal of 150 minutes of combined cardio and strengthening exercise per week for weight loss and overall health benefits. We discussed the following Behavioral Modification Strategies today: travel eating strategies and keeping healthy foods in the home   Audrey Duncan has agreed to follow up with our clinic in 3 weeks. She was informed of the importance of frequent follow up visits to maximize her success with intensive lifestyle modifications for her multiple health conditions.   OBESITY BEHAVIORAL INTERVENTION VISIT  Today's visit was # 8.  Starting weight: 279 lbs Starting date: 09/10/17 Today's weight : 266 lbs  Today's date: 02/27/2018 Total lbs lost to date: 13    ASK: We discussed the diagnosis of obesity with Audrey Duncan today and Audrey Duncan agreed to give [place] permission to discuss obesity behavioral modification therapy today.  ASSESS: Audrey Duncan has the diagnosis of obesity and her BMI today is 45.64 Audrey Duncan is in the action stage of change   ADVISE: Audrey Duncan was educated on the multiple health risks of obesity as well as the  benefit of weight loss to improve her health. She was advised of the need for long term  treatment and the importance of lifestyle modifications.  AGREE: Multiple dietary modification options and treatment options were discussed and  Audrey Duncan agreed to the above obesity treatment plan.  Wilhemena Durie, am acting as transcriptionist for Abby Potash, PA-C I, Abby Potash, PA-C have reviewed above note and agree with its content

## 2018-03-21 ENCOUNTER — Ambulatory Visit (INDEPENDENT_AMBULATORY_CARE_PROVIDER_SITE_OTHER): Payer: 59 | Admitting: Physician Assistant

## 2018-03-21 VITALS — BP 118/84 | HR 77 | Temp 97.7°F | Ht 64.0 in | Wt 266.0 lb

## 2018-03-21 DIAGNOSIS — Z9189 Other specified personal risk factors, not elsewhere classified: Secondary | ICD-10-CM

## 2018-03-21 DIAGNOSIS — E559 Vitamin D deficiency, unspecified: Secondary | ICD-10-CM | POA: Diagnosis not present

## 2018-03-21 DIAGNOSIS — I10 Essential (primary) hypertension: Secondary | ICD-10-CM

## 2018-03-21 DIAGNOSIS — Z6841 Body Mass Index (BMI) 40.0 and over, adult: Secondary | ICD-10-CM | POA: Diagnosis not present

## 2018-03-21 MED ORDER — HYDROCHLOROTHIAZIDE 25 MG PO TABS: 25.0000 mg | ORAL_TABLET | Freq: Every day | ORAL | 0 refills | Status: DC

## 2018-03-21 MED ORDER — VITAMIN D (ERGOCALCIFEROL) 1.25 MG (50000 UNIT) PO CAPS: 50000.0000 [IU] | ORAL_CAPSULE | ORAL | 0 refills | Status: DC

## 2018-03-21 NOTE — Progress Notes (Signed)
Office: [phone]  /  Fax: [phone]   HPI:   Chief Complaint: OBESITY Audrey Duncan is here to discuss her progress with her obesity treatment plan. She is on the Category 2 plan and is following her eating plan approximately 85 % of the time. She states she is walking 30 minutes 5 times per week. Audrey Duncan did well with weight maintenance on vacation. She reports that there are times that she is not getting in all of her protein during the day. Her weight is 266 lb (120.7 kg) today and has not lost weight since her last visit. She has lost 13 lbs since starting treatment with [place].  Vitamin D deficiency Audrey Duncan has a diagnosis of vitamin D deficiency. She is currently taking vit D and denies nausea, vomiting or muscle weakness.  At risk for osteopenia and osteoporosis Audrey Duncan is at higher risk of osteopenia and osteoporosis due to vitamin D deficiency.   Hypertension Audrey Duncan is a 57 y.o. female with hypertension. Audrey Duncan denies chest pain. She is working on weight loss to help control her blood pressure with the goal of decreasing her risk of heart attack and stroke. Audrey Duncan's blood pressure is currently controlled.  ALLERGIES: No Known Allergies  MEDICATIONS: Current Outpatient Medications on File Prior to Visit  Medication Sig Dispense Refill  . buPROPion (WELLBUTRIN SR) 150 MG 12 hr tablet Take 1 tablet (150 mg total) by mouth daily. 30 tablet 0  . Magnesium 250 MG TABS Take 1 tablet by mouth daily.    . metFORMIN (GLUCOPHAGE) 500 MG tablet Take 1 tablet (500 mg total) by mouth daily with breakfast. 30 tablet 0  . polyethylene glycol powder (GLYCOLAX/MIRALAX) powder Take 17 g by mouth daily. 3350 g 0  . potassium chloride SA (K-DUR,KLOR-CON) 20 MEQ tablet TAKE 1 TABLET(20 MEQ) BY MOUTH DAILY 90 tablet 0  . Probiotic Product (ALIGN PO) Take 1 capsule by mouth daily.     Current Facility-Administered Medications on File Prior to Visit  Medication Dose  Route Frequency Provider Last Rate Last Dose  . 0.9 %  sodium chloride infusion  500 mL Intravenous Continuous Doran Stabler, MD        PAST MEDICAL HISTORY: Past Medical History:  Diagnosis Date  . Asthma   . Chiari malformation type II ([place])    diagnosed when in high school  . Chiari malformation type II ([place])   . Colon polyp 2006   (TUBULAR ADENOMA)--COLONOSCOPY-DR. HAYES  . Constipation   . Fluid retention   . H/O seasonal allergies   . Medial meniscus tear    left  . Over weight   . Ulcerative colitis ([place])     PAST SURGICAL HISTORY: Past Surgical History:  Procedure Laterality Date  . ABDOMINAL HYSTERECTOMY  1999   complete  . [place]  . HERNIA REPAIR  [id number]   umbilical    SOCIAL HISTORY: Social History   Tobacco Use  . Smoking status: Never Smoker  . Smokeless tobacco: Never Used  Substance Use Topics  . Alcohol use: Yes    Comment: 1 glass of wine 1-2 x a month  . Drug use: No    FAMILY HISTORY: Family History  Problem Relation Age of Onset  . Heart disease Mother 37       2 MIs   . Stroke Mother 22  . Hypertension Mother   . Cancer Mother        lung, lymphoma  .  Lung cancer Mother        was a smoker  . Kidney disease Mother   . Obesity Mother   . COPD Father        smoker  . Cancer Father        colon, lung prostate  . Alcohol abuse Father   . Hypertension Father   . Obesity Father   . Asthma Sister   . Alcohol abuse Brother   . Diabetes Paternal Uncle     ROS: Review of Systems  Constitutional: Negative for weight loss.  Cardiovascular: Negative for chest pain.  Gastrointestinal: Negative for nausea and vomiting.  Musculoskeletal:       Negative for muscle weakness.    PHYSICAL EXAM: Blood pressure 118/84, pulse 77, temperature 97.7 F (36.5 C), temperature source Oral, height 5\' 4"  (1.626 m), weight 266 lb (120.7 kg), SpO2 94 %. Body mass index is 45.66 kg/m. Physical Exam  Constitutional: She  is oriented to person, place, and time. She appears well-developed and well-nourished.  Cardiovascular: Normal rate.  Pulmonary/Chest: Effort normal.  Musculoskeletal: Normal range of motion.  Neurological: She is oriented to person, place, and time.  Skin: Skin is warm and dry.  Psychiatric: She has a normal mood and affect. Her behavior is normal.  Vitals reviewed.   RECENT LABS AND TESTS: BMET    Component Value Date/Time   NA 143 02/04/2018 0911   K 4.2 02/04/2018 0911   CL 102 02/04/2018 0911   CO2 26 02/04/2018 0911   GLUCOSE 102 (H) 02/04/2018 0911   GLUCOSE 111 (H) 06/11/2017 0950   GLUCOSE 95 05/11/2006 1148   BUN 17 02/04/2018 0911   CREATININE 0.86 02/04/2018 0911   CREATININE 0.96 05/12/2016 1612   CALCIUM 9.3 02/04/2018 0911   GFRNONAA 74 02/04/2018 0911   GFRAA 86 02/04/2018 0911   Lab Results  Component Value Date   HGBA1C 5.3 09/10/2017   Lab Results  Component Value Date   INSULIN 27.9 (H) 02/04/2018   INSULIN 39.9 (H) 09/10/2017   CBC    Component Value Date/Time   WBC 6.6 09/10/2017 0942   WBC 8.7 05/12/2016 1612   RBC 4.61 09/10/2017 0942   RBC 4.69 05/12/2016 1612   HGB 14.5 09/10/2017 0942   HCT 44.2 09/10/2017 0942   PLT 316 05/12/2016 1612   MCV 96 09/10/2017 0942   MCH 31.5 09/10/2017 0942   MCH 30.7 05/12/2016 1612   MCHC 32.8 09/10/2017 0942   MCHC 33.5 05/12/2016 1612   RDW 13.4 09/10/2017 0942   LYMPHSABS 1.7 09/10/2017 0942   MONOABS 696 05/12/2016 1612   EOSABS 0.2 09/10/2017 0942   BASOSABS 0.1 09/10/2017 0942   Iron/TIBC/Ferritin/ %Sat No results found for: IRON, TIBC, FERRITIN, IRONPCTSAT Lipid Panel     Component Value Date/Time   CHOL 144 09/10/2017 0942   TRIG 100 09/10/2017 0942   HDL 41 09/10/2017 0942   CHOLHDL 3 06/11/2017 0950   VLDL 14.8 06/11/2017 0950   LDLCALC 83 09/10/2017 0942   Hepatic Function Panel     Component Value Date/Time   PROT 6.3 02/04/2018 0911   ALBUMIN 4.1 02/04/2018 0911   AST 33  02/04/2018 0911   ALT 34 (H) 02/04/2018 0911   ALKPHOS 102 02/04/2018 0911   BILITOT 0.8 02/04/2018 0911   BILIDIR 0.1 06/12/2014 1359      Component Value Date/Time   TSH 2.180 09/10/2017 0942   TSH 2.62 05/12/2016 1612   TSH 1.87 06/12/2014 1359  Results for Audrey Duncan (MRN [id number]) as of 03/21/2018 17:02  Ref. Range 02/04/2018 09:11  Vitamin D, 25-Hydroxy Latest Ref Range: 30.0 - 100.0 ng/mL 42.7   ASSESSMENT AND PLAN: Vitamin D deficiency - Plan: Vitamin D, Ergocalciferol, (DRISDOL) 50000 units CAPS capsule  Essential hypertension - Plan: hydrochlorothiazide (HYDRODIURIL) 25 MG tablet  At risk for osteoporosis  Class 3 severe obesity with serious comorbidity and body mass index (BMI) of 45.0 to 49.9 in adult, unspecified obesity type ([place])  PLAN:  Vitamin D Deficiency Audrey Duncan was informed that low vitamin D levels contributes to fatigue and are associated with obesity, breast, and colon cancer. She agrees to continue to take prescription Vit D @50 ,000 IU every week # 4 with no refills and will follow up for routine testing of vitamin D, at least 2-3 times per year. She was informed of the risk of over-replacement of vitamin D and agrees to not increase her dose unless she discusses this with [place] first. She agrees to follow up in 3 weeks.  At risk for osteopenia and osteoporosis Audrey Duncan was given extended (15 minutes) osteoporosis prevention counseling today. Audrey Duncan is at risk for osteopenia and osteoporosis due to her vitamin D deficiency. She was encouraged to take her vitamin D and follow her higher calcium diet and increase strengthening exercise to help strengthen her bones and decrease her risk of osteopenia and osteoporosis.  Hypertension We discussed sodium restriction, working on healthy weight loss, and a regular exercise program as the means to achieve improved blood pressure control. Audrey Duncan agreed with this plan and agreed to follow up as directed. We  will continue to monitor her blood pressure as well as her progress with the above lifestyle modifications. She will continue HCTZ 25mg  daily # 30 with no refills as prescribed and will watch for signs of hypotension as she continues her lifestyle modifications. Nelle agrees to follow up as directed.  Obesity Audrey Duncan is currently in the action stage of change. As such, her goal is to continue with weight loss efforts. She has agreed to follow the Category 2 plan. Audrey Duncan has been instructed to work up to a goal of 150 minutes of combined cardio and strengthening exercise per week for weight loss and overall health benefits. We discussed the following Behavioral Modification Strategies today: no skipping meals and work on meal planning and easy cooking plans.  Audrey Duncan has agreed to follow up with our clinic in 3 weeks. She was informed of the importance of frequent follow up visits to maximize her success with intensive lifestyle modifications for her multiple health conditions.   OBESITY BEHAVIORAL INTERVENTION VISIT  Today's visit was # 9   Starting weight: 279 lbs Starting date: 09/10/17 Today's weight : Weight: 266 lb (120.7 kg)  Today's date: 03/21/2018 Total lbs lost to date:13 At least 15 minutes were spent on discussing the following behavioral intervention visit.   ASK: We discussed the diagnosis of obesity with Audrey Duncan today and Audrey Duncan agreed to give [place] permission to discuss obesity behavioral modification therapy today.  ASSESS: Audrey Duncan has the diagnosis of obesity and her BMI today is 45.64. Audrey Duncan is in the action stage of change.   ADVISE: Audrey Duncan was educated on the multiple health risks of obesity as well as the benefit of weight loss to improve her health. She was advised of the need for long term treatment and the importance of lifestyle modifications to improve her current health and to decrease her risk of future health  problems.  AGREE: Multiple dietary modification options and treatment options were discussed and Audrey Duncan agreed to follow the recommendations documented in the above note.  ARRANGE: Audrey Duncan was educated on the importance of frequent visits to treat obesity as outlined per CMS and USPSTF guidelines and agreed to schedule her next follow up appointment today.  Lenward Chancellor, am acting as transcriptionist for Abby Potash, PA-C I, Abby Potash, PA-C have reviewed above note and agree with its content

## 2018-03-23 ENCOUNTER — Encounter (INDEPENDENT_AMBULATORY_CARE_PROVIDER_SITE_OTHER): Payer: Self-pay | Admitting: Physician Assistant

## 2018-03-25 ENCOUNTER — Other Ambulatory Visit (INDEPENDENT_AMBULATORY_CARE_PROVIDER_SITE_OTHER): Payer: Self-pay

## 2018-03-25 DIAGNOSIS — E8881 Metabolic syndrome: Secondary | ICD-10-CM

## 2018-03-25 MED ORDER — METFORMIN HCL 500 MG PO TABS: 500.0000 mg | ORAL_TABLET | Freq: Every day | ORAL | 0 refills | Status: DC

## 2018-03-28 DIAGNOSIS — Z23 Encounter for immunization: Secondary | ICD-10-CM | POA: Diagnosis not present

## 2018-04-04 ENCOUNTER — Ambulatory Visit
Admission: RE | Admit: 2018-04-04 | Discharge: 2018-04-04 | Disposition: A | Discharge status: Home / Self Care | Payer: 59 | Source: Ambulatory Visit | Attending: Family Medicine | Admitting: Family Medicine

## 2018-04-04 DIAGNOSIS — Z1231 Encounter for screening mammogram for malignant neoplasm of breast: Secondary | ICD-10-CM

## 2018-04-16 ENCOUNTER — Ambulatory Visit (INDEPENDENT_AMBULATORY_CARE_PROVIDER_SITE_OTHER): Payer: 59 | Admitting: Physician Assistant

## 2018-04-16 VITALS — BP 118/80 | HR 76 | Temp 97.7°F | Ht 64.0 in | Wt 266.0 lb

## 2018-04-16 DIAGNOSIS — E559 Vitamin D deficiency, unspecified: Secondary | ICD-10-CM

## 2018-04-16 DIAGNOSIS — Z6841 Body Mass Index (BMI) 40.0 and over, adult: Secondary | ICD-10-CM

## 2018-04-16 DIAGNOSIS — Z9189 Other specified personal risk factors, not elsewhere classified: Secondary | ICD-10-CM

## 2018-04-16 DIAGNOSIS — E8881 Metabolic syndrome: Secondary | ICD-10-CM | POA: Diagnosis not present

## 2018-04-16 DIAGNOSIS — F3289 Other specified depressive episodes: Secondary | ICD-10-CM

## 2018-04-16 DIAGNOSIS — E876 Hypokalemia: Secondary | ICD-10-CM | POA: Diagnosis not present

## 2018-04-16 DIAGNOSIS — I10 Essential (primary) hypertension: Secondary | ICD-10-CM

## 2018-04-16 MED ORDER — METFORMIN HCL 500 MG PO TABS: 500.0000 mg | ORAL_TABLET | Freq: Every day | ORAL | 0 refills | Status: DC

## 2018-04-16 MED ORDER — BUPROPION HCL ER (SR) 150 MG PO TB12: 150.0000 mg | ORAL_TABLET | Freq: Every day | ORAL | 0 refills | Status: DC

## 2018-04-16 MED ORDER — POTASSIUM CHLORIDE CRYS ER 20 MEQ PO TBCR: EXTENDED_RELEASE_TABLET | ORAL | 0 refills | Status: DC

## 2018-04-16 MED ORDER — VITAMIN D (ERGOCALCIFEROL) 1.25 MG (50000 UNIT) PO CAPS: 50000.0000 [IU] | ORAL_CAPSULE | ORAL | 0 refills | Status: DC

## 2018-04-16 MED ORDER — HYDROCHLOROTHIAZIDE 25 MG PO TABS: 25.0000 mg | ORAL_TABLET | Freq: Every day | ORAL | 0 refills | Status: DC

## 2018-04-17 NOTE — Progress Notes (Signed)
Office: [phone]  /  Fax: [phone]   HPI:   Chief Complaint: OBESITY Audrey Duncan is here to discuss her progress with her obesity treatment plan. She is on the Category 2 plan and is following her eating plan approximately 94 % of the time. She states she is walking for 30 minutes 4 times per week. Audrey Duncan did well with weight maintenance. She reports following the plan very closely. She has increased her walking throughout the week.  Her weight is 266 lb (120.7 kg) today and has not lost weight since her last visit. She has lost 13 lbs since starting treatment with [place].  Vitamin D Deficiency Audrey Duncan has a diagnosis of vitamin D deficiency. She is currently taking prescription Vit D and denies nausea, vomiting or muscle weakness.  At risk for osteopenia and osteoporosis Audrey Duncan is at higher risk of osteopenia and osteoporosis due to vitamin D deficiency.   Insulin Resistance [name] has a diagnosis of insulin resistance based on her elevated fasting insulin level >5. Although [name]'s blood glucose readings are still under good control, insulin resistance puts her at greater risk of metabolic syndrome and diabetes. She is taking metformin currently and continues to work on diet and exercise to decrease risk of diabetes. She denies polyphagia.[name] Kitchen  Hypertension Audrey Duncan is a 57 y.o. female with hypertension. [name] is on hydrochlorothiazide and potassium chloride. She denies chest pain or palpitations. She is working weight loss to help control her blood pressure with the goal of decreasing her risk of heart attack and stroke. Audrey Duncan's blood pressure is currently controlled.  Depression with emotional eating behaviors Audrey Duncan denies cravings and her blood pressure is normal. Audrey Duncan struggles with emotional eating and using food for comfort to the extent that it is negatively impacting her health. She often snacks when she is not hungry. Audrey Duncan sometimes feels she  is out of control and then feels guilty that she made poor food choices. She has been working on behavior modification techniques to help reduce her emotional eating and has been somewhat successful. She shows no sign of suicidal or homicidal ideations.  Depression screen [facility] 2/9 09/10/2017 06/11/2017 05/12/2016 01/06/2013  Decreased Interest 2 0 0 0  Down, Depressed, Hopeless 1 0 0 0  PHQ - 2 Score 3 0 0 0  Altered sleeping 2 - - -  Tired, decreased energy 3 - - -  Change in appetite 1 - - -  Feeling bad or failure about yourself  2 - - -  Trouble concentrating 0 - - -  Moving slowly or fidgety/restless 0 - - -  Suicidal thoughts 0 - - -  PHQ-9 Score 11 - - -  Difficult doing work/chores Not difficult at all - - -    ALLERGIES: No Known Allergies  MEDICATIONS: Current Outpatient Medications on File Prior to Visit  Medication Sig Dispense Refill  . Magnesium 250 MG TABS Take 1 tablet by mouth daily.    . polyethylene glycol powder (GLYCOLAX/MIRALAX) powder Take 17 g by mouth daily. 3350 g 0  . Probiotic Product (ALIGN PO) Take 1 capsule by mouth daily.     Current Facility-Administered Medications on File Prior to Visit  Medication Dose Route Frequency Provider Last Rate Last Dose  . 0.9 %  sodium chloride infusion  500 mL Intravenous Continuous Doran Stabler, MD        PAST MEDICAL HISTORY: Past Medical History:  Diagnosis Date  . Asthma   . Chiari malformation type II (  [place])    diagnosed when in high school  . Chiari malformation type II ([place])   . Colon polyp 2006   (TUBULAR ADENOMA)--COLONOSCOPY-DR. HAYES  . Constipation   . Fluid retention   . H/O seasonal allergies   . Medial meniscus tear    left  . Over weight   . Ulcerative colitis ([place])     PAST SURGICAL HISTORY: Past Surgical History:  Procedure Laterality Date  . ABDOMINAL HYSTERECTOMY  1999   complete  . [place]  . HERNIA REPAIR  [id number]   umbilical    SOCIAL HISTORY: Social  History   Tobacco Use  . Smoking status: Never Smoker  . Smokeless tobacco: Never Used  Substance Use Topics  . Alcohol use: Yes    Comment: 1 glass of wine 1-2 x a month  . Drug use: No    FAMILY HISTORY: Family History  Problem Relation Age of Onset  . Heart disease Mother 85       2 MIs   . Stroke Mother 86  . Hypertension Mother   . Cancer Mother        lung, lymphoma  . Lung cancer Mother        was a smoker  . Kidney disease Mother   . Obesity Mother   . COPD Father        smoker  . Cancer Father        colon, lung prostate  . Alcohol abuse Father   . Hypertension Father   . Obesity Father   . Asthma Sister   . Alcohol abuse Brother   . Diabetes Paternal Uncle   . Breast cancer Neg Hx     ROS: Review of Systems  Constitutional: Negative for weight loss.  Cardiovascular: Negative for chest pain and palpitations.  Gastrointestinal: Negative for nausea and vomiting.  Musculoskeletal:       Negative muscle weakness  Endo/Heme/Allergies:       Negative polyphagia  Psychiatric/Behavioral: Positive for depression. Negative for suicidal ideas.    PHYSICAL EXAM: Blood pressure 118/80, pulse 76, temperature 97.7 F (36.5 C), temperature source Oral, height 5\' 4"  (1.626 m), weight 266 lb (120.7 kg), SpO2 93 %. Body mass index is 45.66 kg/m. Physical Exam  Constitutional: She is oriented to person, place, and time. She appears well-developed and well-nourished.  Cardiovascular: Normal rate.  Pulmonary/Chest: Effort normal.  Musculoskeletal: Normal range of motion.  Neurological: She is oriented to person, place, and time.  Skin: Skin is warm and dry.  Psychiatric: She has a normal mood and affect. Her behavior is normal.  Vitals reviewed.   RECENT LABS AND TESTS: BMET    Component Value Date/Time   NA 143 02/04/2018 0911   K 4.2 02/04/2018 0911   CL 102 02/04/2018 0911   CO2 26 02/04/2018 0911   GLUCOSE 102 (H) 02/04/2018 0911   GLUCOSE 111 (H)  06/11/2017 0950   GLUCOSE 95 05/11/2006 1148   BUN 17 02/04/2018 0911   CREATININE 0.86 02/04/2018 0911   CREATININE 0.96 05/12/2016 1612   CALCIUM 9.3 02/04/2018 0911   GFRNONAA 74 02/04/2018 0911   GFRAA 86 02/04/2018 0911   Lab Results  Component Value Date   HGBA1C 5.3 09/10/2017   Lab Results  Component Value Date   INSULIN 27.9 (H) 02/04/2018   INSULIN 39.9 (H) 09/10/2017   CBC    Component Value Date/Time   WBC 6.6 09/10/2017 0942   WBC 8.7 05/12/2016  1612   RBC 4.61 09/10/2017 0942   RBC 4.69 05/12/2016 1612   HGB 14.5 09/10/2017 0942   HCT 44.2 09/10/2017 0942   PLT 316 05/12/2016 1612   MCV 96 09/10/2017 0942   MCH 31.5 09/10/2017 0942   MCH 30.7 05/12/2016 1612   MCHC 32.8 09/10/2017 0942   MCHC 33.5 05/12/2016 1612   RDW 13.4 09/10/2017 0942   LYMPHSABS 1.7 09/10/2017 0942   MONOABS 696 05/12/2016 1612   EOSABS 0.2 09/10/2017 0942   BASOSABS 0.1 09/10/2017 0942   Iron/TIBC/Ferritin/ %Sat No results found for: IRON, TIBC, FERRITIN, IRONPCTSAT Lipid Panel     Component Value Date/Time   CHOL 144 09/10/2017 0942   TRIG 100 09/10/2017 0942   HDL 41 09/10/2017 0942   CHOLHDL 3 06/11/2017 0950   VLDL 14.8 06/11/2017 0950   LDLCALC 83 09/10/2017 0942   Hepatic Function Panel     Component Value Date/Time   PROT 6.3 02/04/2018 0911   ALBUMIN 4.1 02/04/2018 0911   AST 33 02/04/2018 0911   ALT 34 (H) 02/04/2018 0911   ALKPHOS 102 02/04/2018 0911   BILITOT 0.8 02/04/2018 0911   BILIDIR 0.1 06/12/2014 1359      Component Value Date/Time   TSH 2.180 09/10/2017 0942   TSH 2.62 05/12/2016 1612   TSH 1.87 06/12/2014 1359  Results for Arko, Jazmene R (MRN [id number]) as of 04/17/2018 14:02  Ref. Range 02/04/2018 09:11  Vitamin D, 25-Hydroxy Latest Ref Range: 30.0 - 100.0 ng/mL 42.7    ASSESSMENT AND PLAN: Vitamin D deficiency - Plan: Vitamin D, Ergocalciferol, (DRISDOL) 50000 units CAPS capsule  Insulin resistance - Plan: metFORMIN (GLUCOPHAGE)  500 MG tablet  Hypokalemia - Plan: potassium chloride SA (K-DUR,KLOR-CON) 20 MEQ tablet  Essential hypertension - Plan: hydrochlorothiazide (HYDRODIURIL) 25 MG tablet  Other depression - with emotional eating - Plan: buPROPion (WELLBUTRIN SR) 150 MG 12 hr tablet  At risk for osteoporosis  Class 3 severe obesity with serious comorbidity and body mass index (BMI) of 45.0 to 49.9 in adult, unspecified obesity type ([facility])  PLAN:  Vitamin D Deficiency Alayia was informed that low vitamin D levels contributes to fatigue and are associated with obesity, breast, and colon cancer. Conchita agrees to continue taking prescription Vit D @50 ,000 IU every week #4 and we will refill for 1 month. She will follow up for routine testing of vitamin D, at least 2-3 times per year. She was informed of the risk of over-replacement of vitamin D and agrees to not increase her dose unless she discusses this with [place] first. Maysoon agrees to follow up with our clinic in 3 weeks.  At risk for osteopenia and osteoporosis Jiah was given extended (15 minutes) osteoporosis prevention counseling today. Torianne is at risk for osteopenia and osteoporsis due to her vitamin D deficiency. She was encouraged to take her vitamin D and follow her higher calcium diet and increase strengthening exercise to help strengthen her bones and decrease her risk of osteopenia and osteoporosis.  Insulin Resistance Holden will continue to work on weight loss, exercise, and decreasing simple carbohydrates in her diet to help decrease the risk of diabetes. We dicussed metformin including benefits and risks. She was informed that eating too many simple carbohydrates or too many calories at one sitting increases the likelihood of GI side effects. Jameisha agrees to continue taking metformin 500 mg q AM #30 and we will refill for 1 month. Dmiya agrees to follow up with our clinic in 3 weeks as  directed to monitor her  progress.  Hypertension We discussed sodium restriction, working on healthy weight loss, and a regular exercise program as the means to achieve improved blood pressure control. Ashtyn agreed with this plan and agreed to follow up as directed. We will continue to monitor her blood pressure as well as her progress with the above lifestyle modifications. Adysson agrees to continue taking hydrochlorothiazide 25 mg qd #30 and we will refill for 1 month, and she agrees to continue potassium chloride 20 meq qd #30 and we will refill for 1 month. She will watch for signs of hypotension as she continues her lifestyle modifications. Cathyrn agrees to follow up with our clinic in 3 weeks.  Depression with Emotional Eating Behaviors We discussed behavior modification techniques today to help Viviane deal with her emotional eating and depression. Nzinga agrees to continue taking Wellbutrin SR 150 mg qd #30 and we will refill for 1 month. Sanyiah agrees to follow up with our clinic in 3 weeks.  Obesity Pyper is currently in the action stage of change. As such, her goal is to continue with weight loss efforts She has agreed to follow the Category 2 plan Georgette has been instructed to work up to a goal of 150 minutes of combined cardio and strengthening exercise per week for weight loss and overall health benefits. We discussed the following Behavioral Modification Strategies today: work on meal planning and easy cooking plans and planning for success We will repeat IC at next visit.  Dustyn has agreed to follow up with our clinic in 3 weeks. She was informed of the importance of frequent follow up visits to maximize her success with intensive lifestyle modifications for her multiple health conditions.   OBESITY BEHAVIORAL INTERVENTION VISIT  Today's visit was # 10   Starting weight: 279 lbs Starting date: 09/10/17 Today's weight : 266 lbs Today's date: 04/16/2018 Total lbs lost to date:  13    ASK: We discussed the diagnosis of obesity with Felipa Furnace today and Anderson Malta agreed to give [place] permission to discuss obesity behavioral modification therapy today.  ASSESS: Ifrah has the diagnosis of obesity and her BMI today is 45.64 Anjulie is in the action stage of change   ADVISE: Shakerra was educated on the multiple health risks of obesity as well as the benefit of weight loss to improve her health. She was advised of the need for long term treatment and the importance of lifestyle modifications.  AGREE: Multiple dietary modification options and treatment options were discussed and  Kadyn agreed to the above obesity treatment plan.  Wilhemena Durie, am acting as transcriptionist for Abby Potash, PA-C I, Abby Potash, PA-C have reviewed above note and agree with its content

## 2018-05-01 ENCOUNTER — Other Ambulatory Visit: Payer: Self-pay | Admitting: Family Medicine

## 2018-05-01 DIAGNOSIS — E876 Hypokalemia: Secondary | ICD-10-CM

## 2018-05-09 ENCOUNTER — Encounter (INDEPENDENT_AMBULATORY_CARE_PROVIDER_SITE_OTHER): Payer: Self-pay

## 2018-05-09 ENCOUNTER — Ambulatory Visit (INDEPENDENT_AMBULATORY_CARE_PROVIDER_SITE_OTHER): Payer: 59 | Admitting: Family Medicine

## 2018-05-14 ENCOUNTER — Ambulatory Visit (INDEPENDENT_AMBULATORY_CARE_PROVIDER_SITE_OTHER): Payer: 59 | Admitting: Physician Assistant

## 2018-05-14 ENCOUNTER — Encounter (INDEPENDENT_AMBULATORY_CARE_PROVIDER_SITE_OTHER): Payer: Self-pay | Admitting: Physician Assistant

## 2018-05-14 VITALS — BP 121/82 | HR 79 | Temp 98.2°F | Ht 64.0 in | Wt 262.0 lb

## 2018-05-14 DIAGNOSIS — E559 Vitamin D deficiency, unspecified: Secondary | ICD-10-CM

## 2018-05-14 DIAGNOSIS — I10 Essential (primary) hypertension: Secondary | ICD-10-CM | POA: Diagnosis not present

## 2018-05-14 DIAGNOSIS — Z9189 Other specified personal risk factors, not elsewhere classified: Secondary | ICD-10-CM | POA: Diagnosis not present

## 2018-05-14 DIAGNOSIS — F3289 Other specified depressive episodes: Secondary | ICD-10-CM

## 2018-05-14 DIAGNOSIS — Z6841 Body Mass Index (BMI) 40.0 and over, adult: Secondary | ICD-10-CM

## 2018-05-14 MED ORDER — HYDROCHLOROTHIAZIDE 25 MG PO TABS: 25.0000 mg | ORAL_TABLET | Freq: Every day | ORAL | 0 refills | Status: DC

## 2018-05-14 MED ORDER — VITAMIN D (ERGOCALCIFEROL) 1.25 MG (50000 UNIT) PO CAPS: 50000.0000 [IU] | ORAL_CAPSULE | ORAL | 0 refills | Status: DC

## 2018-05-14 MED ORDER — BUPROPION HCL ER (SR) 150 MG PO TB12: 150.0000 mg | ORAL_TABLET | Freq: Every day | ORAL | 0 refills | Status: DC

## 2018-05-14 NOTE — Progress Notes (Signed)
Office: [phone]  /  Fax: [phone]   HPI:   Chief Complaint: OBESITY Audrey Duncan is here to discuss her progress with her obesity treatment plan. She is on the Category 2 plan and is following her eating plan approximately 90 % of the time. She states she is walking for 30 minutes 3-4 times per week. Audrey Duncan did very well with weight loss. She reports eating more seafood than normal and also walking more.  Her weight is 262 lb (118.8 kg) today and has had a weight loss of 4 pounds over a period of 4 weeks since her last visit. She has lost 17 lbs since starting treatment with [place].  Vitamin D Deficiency Audrey Duncan has a diagnosis of vitamin D deficiency. She is currently taking prescription Vit D and denies nausea, vomiting or muscle weakness.  Hypertension Audrey Duncan is a 57 y.o. female with hypertension. Audrey Duncan's blood pressure is normal and she denies chest pain. She is working weight loss to help control her blood pressure with the goal of decreasing her risk of heart attack and stroke. Audrey Duncan's blood pressure is currently controlled.  At risk for cardiovascular disease Audrey Duncan is at a higher than average risk for cardiovascular disease due to obesity and hypertension. She currently denies any chest pain.  Depression with emotional eating behaviors Audrey Duncan denies cravings or polyphagia, and her blood pressure is normal. Audrey Duncan struggles with emotional eating and using food for comfort to the extent that it is negatively impacting her health. She often snacks when she is not hungry. Audrey Duncan sometimes feels she is out of control and then feels guilty that she made poor food choices. She has been working on behavior modification techniques to help reduce her emotional eating and has been somewhat successful. She shows no sign of suicidal or homicidal ideations.  Depression screen Audrey Duncan, [place] 2/9 09/10/2017 06/11/2017 05/12/2016 01/06/2013  Decreased Interest 2 0 0 0  Down,  Depressed, Hopeless 1 0 0 0  PHQ - 2 Score 3 0 0 0  Altered sleeping 2 - - -  Tired, decreased energy 3 - - -  Change in appetite 1 - - -  Feeling bad or failure about yourself  2 - - -  Trouble concentrating 0 - - -  Moving slowly or fidgety/restless 0 - - -  Suicidal thoughts 0 - - -  PHQ-9 Score 11 - - -  Difficult doing work/chores Not difficult at all - - -    ALLERGIES: No Known Allergies  MEDICATIONS: Current Outpatient Medications on File Prior to Visit  Medication Sig Dispense Refill  . Magnesium 250 MG TABS Take 1 tablet by mouth daily.    . metFORMIN (GLUCOPHAGE) 500 MG tablet Take 1 tablet (500 mg total) by mouth daily with breakfast. 30 tablet 0  . polyethylene glycol powder (GLYCOLAX/MIRALAX) powder Take 17 g by mouth daily. 3350 g 0  . potassium chloride SA (K-DUR,KLOR-CON) 20 MEQ tablet TAKE 1 TABLET(20 MEQ) BY MOUTH DAILY 30 tablet 0  . Probiotic Product (ALIGN PO) Take 1 capsule by mouth daily.     Current Facility-Administered Medications on File Prior to Visit  Medication Dose Route Frequency Provider Last Rate Last Dose  . 0.9 %  sodium chloride infusion  500 mL Intravenous Continuous Doran Stabler, MD        PAST MEDICAL HISTORY: Past Medical History:  Diagnosis Date  . Asthma   . Chiari malformation type II ([place])    diagnosed when in high school  .  Chiari malformation type II ([place])   . Colon polyp 2006   (TUBULAR ADENOMA)--COLONOSCOPY-DR. HAYES  . Constipation   . Fluid retention   . H/O seasonal allergies   . Medial meniscus tear    left  . Over weight   . Ulcerative colitis ([place])     PAST SURGICAL HISTORY: Past Surgical History:  Procedure Laterality Date  . ABDOMINAL HYSTERECTOMY  1999   complete  . [place]  . HERNIA REPAIR  7169   umbilical    SOCIAL HISTORY: Social History   Tobacco Use  . Smoking status: Never Smoker  . Smokeless tobacco: Never Used  Substance Use Topics  . Alcohol use: Yes     Comment: 1 glass of wine 1-2 x a month  . Drug use: No    FAMILY HISTORY: Family History  Problem Relation Age of Onset  . Heart disease Mother 57       2 MIs   . Stroke Mother 87  . Hypertension Mother   . Cancer Mother        lung, lymphoma  . Lung cancer Mother        was a smoker  . Kidney disease Mother   . Obesity Mother   . COPD Father        smoker  . Cancer Father        colon, lung prostate  . Alcohol abuse Father   . Hypertension Father   . Obesity Father   . Asthma Sister   . Alcohol abuse Brother   . Diabetes Paternal Uncle   . Breast cancer Neg Hx     ROS: Review of Systems  Constitutional: Positive for weight loss.  Cardiovascular: Negative for chest pain.  Gastrointestinal: Negative for nausea and vomiting.  Musculoskeletal:       Negative muscle weakness  Endo/Heme/Allergies:       Negative polyphagia  Psychiatric/Behavioral: Positive for depression. Negative for suicidal ideas.    PHYSICAL EXAM: Blood pressure 121/82, pulse 79, temperature 98.2 F (36.8 C), temperature source Oral, height 5\' 4"  (1.626 m), weight 262 lb (118.8 kg), SpO2 96 %. Body mass index is 44.97 kg/m. Physical Exam  Constitutional: She is oriented to person, place, and time. She appears well-developed and well-nourished.  Cardiovascular: Normal rate.  Pulmonary/Chest: Effort normal.  Musculoskeletal: Normal range of motion.  Neurological: She is oriented to person, place, and time.  Skin: Skin is warm and dry.  Psychiatric: She has a normal mood and affect. Her behavior is normal.  Vitals reviewed.   RECENT LABS AND TESTS: BMET    Component Value Date/Time   NA 143 02/04/2018 0911   K 4.2 02/04/2018 0911   CL 102 02/04/2018 0911   CO2 26 02/04/2018 0911   GLUCOSE 102 (H) 02/04/2018 0911   GLUCOSE 111 (H) 06/11/2017 0950   GLUCOSE 95 05/11/2006 1148   BUN 17 02/04/2018 0911   CREATININE 0.86 02/04/2018 0911   CREATININE 0.96 05/12/2016 1612   CALCIUM 9.3  02/04/2018 0911   GFRNONAA 74 02/04/2018 0911   GFRAA 86 02/04/2018 0911   Lab Results  Component Value Date   HGBA1C 5.3 09/10/2017   Lab Results  Component Value Date   INSULIN 27.9 (H) 02/04/2018   INSULIN 39.9 (H) 09/10/2017   CBC    Component Value Date/Time   WBC 6.6 09/10/2017 0942   WBC 8.7 05/12/2016 1612   RBC 4.61 09/10/2017 0942   RBC 4.69 05/12/2016 1612  HGB 14.5 09/10/2017 0942   HCT 44.2 09/10/2017 0942   PLT 316 05/12/2016 1612   MCV 96 09/10/2017 0942   MCH 31.5 09/10/2017 0942   MCH 30.7 05/12/2016 1612   MCHC 32.8 09/10/2017 0942   MCHC 33.5 05/12/2016 1612   RDW 13.4 09/10/2017 0942   LYMPHSABS 1.7 09/10/2017 0942   MONOABS 696 05/12/2016 1612   EOSABS 0.2 09/10/2017 0942   BASOSABS 0.1 09/10/2017 0942   Iron/TIBC/Ferritin/ %Sat No results found for: IRON, TIBC, FERRITIN, IRONPCTSAT Lipid Panel     Component Value Date/Time   CHOL 144 09/10/2017 0942   TRIG 100 09/10/2017 0942   HDL 41 09/10/2017 0942   CHOLHDL 3 06/11/2017 0950   VLDL 14.8 06/11/2017 0950   LDLCALC 83 09/10/2017 0942   Hepatic Function Panel     Component Value Date/Time   PROT 6.3 02/04/2018 0911   ALBUMIN 4.1 02/04/2018 0911   AST 33 02/04/2018 0911   ALT 34 (H) 02/04/2018 0911   ALKPHOS 102 02/04/2018 0911   BILITOT 0.8 02/04/2018 0911   BILIDIR 0.1 06/12/2014 1359      Component Value Date/Time   TSH 2.180 09/10/2017 0942   TSH 2.62 05/12/2016 1612   TSH 1.87 06/12/2014 1359  Results for Suminski, Kella R (MRN [id number]) as of 05/14/2018 15:32  Ref. Range 02/04/2018 09:11  Vitamin D, 25-Hydroxy Latest Ref Range: 30.0 - 100.0 ng/mL 42.7    ASSESSMENT AND PLAN: Vitamin D deficiency - Plan: Vitamin D, Ergocalciferol, (DRISDOL) 50000 units CAPS capsule  Essential hypertension - Plan: hydrochlorothiazide (HYDRODIURIL) 25 MG tablet  Other depression - with emotional eating - Plan: buPROPion (WELLBUTRIN SR) 150 MG 12 hr tablet  At risk for heart  disease  Class 3 severe obesity with serious comorbidity and body mass index (BMI) of 45.0 to 49.9 in adult, unspecified obesity type ([place])  PLAN:  Vitamin D Deficiency Audrey Duncan was informed that low vitamin D levels contributes to fatigue and are associated with obesity, breast, and colon cancer. Audrey Duncan agrees to continue taking prescription Vit D @50 ,000 IU every week #4 and we will refill for 1 month. She will follow up for routine testing of vitamin D, at least 2-3 times per year. She was informed of the risk of over-replacement of vitamin D and agrees to not increase her dose unless she discusses this with [place] first. Audrey Duncan agrees to follow up with our clinic in 3 weeks.  Hypertension We discussed sodium restriction, working on healthy weight loss, and a regular exercise program as the means to achieve improved blood pressure control. Audrey Duncan agreed with this plan and agreed to follow up as directed. We will continue to monitor her blood pressure as well as her progress with the above lifestyle modifications. Audrey Duncan agrees to continue taking hydrochlorothiazide 25 mg qd #30 and we will refill for 1 month. She will watch for signs of hypotension as she continues her lifestyle modifications. Audrey Duncan agrees to follow up with our clinic in 3 weeks.  Cardiovascular risk counselling Audrey Duncan was given extended (15 minutes) coronary artery disease prevention counseling today. She is 57 y.o. female and has risk factors for heart disease including obesity and hypertension. We discussed intensive lifestyle modifications today with an emphasis on specific weight loss instructions and strategies. Pt was also informed of the importance of increasing exercise and decreasing saturated fats to help prevent heart disease.  Depression with Emotional Eating Behaviors We discussed behavior modification techniques today to help Audrey Duncan deal with her emotional eating  and depression. Audrey Duncan agrees to  continue taking bupropion SR 150 mg qd #30 and we will refill for 1 month. Audrey Duncan agrees to follow up with our clinic in 3 weeks.  Obesity Audrey Duncan is currently in the action stage of change. As such, her goal is to continue with weight loss efforts She has agreed to follow the Category 2 plan Audrey Duncan has been instructed to work up to a goal of 150 minutes of combined cardio and strengthening exercise per week for weight loss and overall health benefits. We discussed the following Behavioral Modification Strategies today: work on meal planning and easy cooking plans and holiday eating strategies    Audrey Duncan has agreed to follow up with our clinic in 3 weeks. She was informed of the importance of frequent follow up visits to maximize her success with intensive lifestyle modifications for her multiple health conditions.   OBESITY BEHAVIORAL INTERVENTION VISIT  Today's visit was # 11   Starting weight: 279 lbs Starting date: 09/10/17 Today's weight : 262 lbs  Today's date: 05/14/2018 Total lbs lost to date: 17    ASK: We discussed the diagnosis of obesity with Audrey Duncan today and Audrey Duncan agreed to give [place] permission to discuss obesity behavioral modification therapy today.  ASSESS: Audrey Duncan has the diagnosis of obesity and her BMI today is 44.95 [place] is in the action stage of change   ADVISE: Caralynn was educated on the multiple health risks of obesity as well as the benefit of weight loss to improve her health. She was advised of the need for long term treatment and the importance of lifestyle modifications.  AGREE: Multiple dietary modification options and treatment options were discussed and  [place] agreed to the above obesity treatment plan.  Wilhemena Durie, am acting as transcriptionist for Abby Potash, PA-C I, Abby Potash, PA-C have reviewed above note and agree with its content

## 2018-06-05 ENCOUNTER — Ambulatory Visit (INDEPENDENT_AMBULATORY_CARE_PROVIDER_SITE_OTHER): Payer: 59 | Admitting: Physician Assistant

## 2018-06-05 VITALS — BP 121/85 | HR 89 | Temp 98.5°F | Ht 64.0 in | Wt 265.0 lb

## 2018-06-05 DIAGNOSIS — E7849 Other hyperlipidemia: Secondary | ICD-10-CM

## 2018-06-05 DIAGNOSIS — E8881 Metabolic syndrome: Secondary | ICD-10-CM | POA: Diagnosis not present

## 2018-06-05 DIAGNOSIS — I10 Essential (primary) hypertension: Secondary | ICD-10-CM

## 2018-06-05 DIAGNOSIS — Z6841 Body Mass Index (BMI) 40.0 and over, adult: Secondary | ICD-10-CM | POA: Diagnosis not present

## 2018-06-05 DIAGNOSIS — Z9189 Other specified personal risk factors, not elsewhere classified: Secondary | ICD-10-CM

## 2018-06-05 DIAGNOSIS — E88819 Insulin resistance, unspecified: Secondary | ICD-10-CM

## 2018-06-05 DIAGNOSIS — E559 Vitamin D deficiency, unspecified: Secondary | ICD-10-CM

## 2018-06-05 DIAGNOSIS — F3289 Other specified depressive episodes: Secondary | ICD-10-CM

## 2018-06-05 MED ORDER — METFORMIN HCL 500 MG PO TABS: 500.0000 mg | ORAL_TABLET | Freq: Every day | ORAL | 0 refills | Status: DC

## 2018-06-05 MED ORDER — VITAMIN D (ERGOCALCIFEROL) 1.25 MG (50000 UNIT) PO CAPS: 50000.0000 [IU] | ORAL_CAPSULE | ORAL | 0 refills | Status: DC

## 2018-06-06 LAB — COMPREHENSIVE METABOLIC PANEL
ALK PHOS: 115 IU/L (ref 39–117)
ALT: 25 IU/L (ref 0–32)
AST: 21 IU/L (ref 0–40)
Albumin/Globulin Ratio: 1.8 (ref 1.2–2.2)
Albumin: 4.4 g/dL (ref 3.5–5.5)
BUN/Creatinine Ratio: 19 (ref 9–23)
BUN: 18 mg/dL (ref 6–24)
Bilirubin Total: 0.6 mg/dL (ref 0.0–1.2)
CALCIUM: 9.4 mg/dL (ref 8.7–10.2)
CO2: 25 mmol/L (ref 20–29)
Chloride: 102 mmol/L (ref 96–106)
Creatinine, Ser: 0.94 mg/dL (ref 0.57–1.00)
GFR calc Af Amer: 77 mL/min/{1.73_m2} (ref >59)
GFR calc non Af Amer: 67 mL/min/{1.73_m2} (ref >59)
GLUCOSE: 108 mg/dL — AB (ref 65–99)
Globulin, Total: 2.4 g/dL (ref 1.5–4.5)
Potassium: 4.3 mmol/L (ref 3.5–5.2)
Sodium: 142 mmol/L (ref 134–144)
Total Protein: 6.8 g/dL (ref 6.0–8.5)

## 2018-06-06 LAB — LIPID PANEL WITH LDL/HDL RATIO
CHOLESTEROL TOTAL: 146 mg/dL (ref 100–199)
HDL: 52 mg/dL (ref >39)
LDL Calculated: 77 mg/dL (ref 0–99)
LDl/HDL Ratio: 1.5 ratio (ref 0.0–3.2)
TRIGLYCERIDES: 87 mg/dL (ref 0–149)
VLDL Cholesterol Cal: 17 mg/dL (ref 5–40)

## 2018-06-06 LAB — INSULIN, RANDOM: INSULIN: 37.8 u[IU]/mL — AB (ref 2.6–24.9)

## 2018-06-06 LAB — VITAMIN D 25 HYDROXY (VIT D DEFICIENCY, FRACTURES): Vit D, 25-Hydroxy: 40.4 ng/mL (ref 30.0–100.0)

## 2018-06-06 LAB — HEMOGLOBIN A1C
ESTIMATED AVERAGE GLUCOSE: 108 mg/dL
HEMOGLOBIN A1C: 5.4 % (ref 4.8–5.6)

## 2018-06-09 ENCOUNTER — Encounter (INDEPENDENT_AMBULATORY_CARE_PROVIDER_SITE_OTHER): Payer: Self-pay | Admitting: Physician Assistant

## 2018-06-10 MED ORDER — BUPROPION HCL ER (SR) 150 MG PO TB12: 150.0000 mg | ORAL_TABLET | Freq: Every day | ORAL | 0 refills | Status: DC

## 2018-06-10 MED ORDER — POTASSIUM CHLORIDE CRYS ER 20 MEQ PO TBCR: EXTENDED_RELEASE_TABLET | ORAL | 0 refills | Status: DC

## 2018-06-12 NOTE — Progress Notes (Signed)
Office: [phone]  /  Fax: [phone]   HPI:   Chief Complaint: OBESITY Audrey Duncan is here to discuss her progress with her obesity treatment plan. She is on the Category 2 plan and is following her eating plan approximately 93 % of the time. She states she is walking 10 minutes 3 times per week. Audrey Duncan reports following the plan closely and is discouraged today due to gaining weight. She does report some constipation. She has been weighing her meat and monitoring her snacks.  Her weight is 265 lb (120.2 kg) today and has had a weight gain of 3 pounds over a period of 3 weeks since her last visit. She has lost 14 lbs since starting treatment with [place].  Insulin Resistance Audrey Duncan has a diagnosis of insulin resistance based on her elevated fasting insulin level >5. Although Audrey Duncan's blood glucose readings are still under good control, insulin resistance puts her at greater risk of metabolic syndrome and diabetes. She is taking metformin 500mg  currently and continues to work on diet and exercise to decrease risk of diabetes. She denies nausea, vomiting, and diarrhea.  Vitamin D deficiency Audrey Duncan has a diagnosis of vitamin D deficiency. She is currently taking vit D and denies nausea, vomiting, or muscle weakness.  At risk for osteopenia and osteoporosis Audrey Duncan is at higher risk of osteopenia and osteoporosis due to vitamin D deficiency.   Depression with emotional eating behaviors Audrey Duncan is struggling with emotional eating and using food for comfort to the extent that it is negatively impacting her health. She often snacks when she is not hungry. Audrey Duncan sometimes feels she is out of control and then feels guilty that she made poor food choices. She has been working on behavior modification techniques to help reduce her emotional eating and has been somewhat successful. Audrey Duncan denies cravings and her blood pressure is normal.  Hypertension Audrey Duncan is a 57 y.o.  female with hypertension. Audrey Duncan is on HCTZ 25mg  and potassium 53mEQ and her blood pressure is normal. She is working on weight loss to help control her blood pressure with the goal of decreasing her risk of heart attack and stroke.  Audrey Duncan denies palpitations.  Hyperlipidemia Audrey Duncan has hyperlipidemia and has been trying to improve her cholesterol levels with intensive lifestyle modification including a low saturated fat diet, exercise and weight loss. She is not taking any medicines and denies any chest pains.  ALLERGIES: No Known Allergies  MEDICATIONS: Current Outpatient Medications on File Prior to Visit  Medication Sig Dispense Refill  . hydrochlorothiazide (HYDRODIURIL) 25 MG tablet Take 1 tablet (25 mg total) by mouth daily. 30 tablet 0  . Magnesium 250 MG TABS Take 1 tablet by mouth daily.    . polyethylene glycol powder (GLYCOLAX/MIRALAX) powder Take 17 g by mouth daily. 3350 g 0  . Probiotic Product (ALIGN PO) Take 1 capsule by mouth daily.     Current Facility-Administered Medications on File Prior to Visit  Medication Dose Route Frequency Provider Last Rate Last Dose  . 0.9 %  sodium chloride infusion  500 mL Intravenous Continuous Doran Stabler, MD        PAST MEDICAL HISTORY: Past Medical History:  Diagnosis Date  . Asthma   . Chiari malformation type II ([place])    diagnosed when in high school  . Chiari malformation type II ([place])   . Colon polyp 2006   (TUBULAR ADENOMA)--COLONOSCOPY-DR. HAYES  . Constipation   . Fluid retention   . H/O seasonal allergies   .  Medial meniscus tear    left  . Over weight   . Ulcerative colitis ([place])     PAST SURGICAL HISTORY: Past Surgical History:  Procedure Laterality Date  . ABDOMINAL HYSTERECTOMY  1999   complete  . [place]  . HERNIA REPAIR  2119   umbilical    SOCIAL HISTORY: Social History   Tobacco Use  . Smoking status: Never Smoker  . Smokeless tobacco: Never Used  Substance  Use Topics  . Alcohol use: Yes    Comment: 1 glass of wine 1-2 x a month  . Drug use: No    FAMILY HISTORY: Family History  Problem Relation Age of Onset  . Heart disease Mother 61       2 MIs   . Stroke Mother 67  . Hypertension Mother   . Cancer Mother        lung, lymphoma  . Lung cancer Mother        was a smoker  . Kidney disease Mother   . Obesity Mother   . COPD Father        smoker  . Cancer Father        colon, lung prostate  . Alcohol abuse Father   . Hypertension Father   . Obesity Father   . Asthma Sister   . Alcohol abuse Brother   . Diabetes Paternal Uncle   . Breast cancer Neg Hx     ROS: Review of Systems  Constitutional: Negative for weight loss.  Cardiovascular: Negative for chest pain and palpitations.  Gastrointestinal: Positive for constipation. Negative for diarrhea, nausea and vomiting.  Musculoskeletal:       Negative for muscle weakness.  Psychiatric/Behavioral: Positive for depression.    PHYSICAL EXAM: Blood pressure 121/85, pulse 89, temperature 98.5 F (36.9 C), temperature source Oral, height 5\' 4"  (1.626 m), weight 265 lb (120.2 kg), SpO2 95 %. Body mass index is 45.49 kg/m. Physical Exam  Constitutional: She is oriented to person, place, and time. She appears well-developed and well-nourished.  Cardiovascular: Normal rate.  Pulmonary/Chest: Effort normal.  Musculoskeletal: Normal range of motion.  Neurological: She is oriented to person, place, and time.  Skin: Skin is warm and dry.  Psychiatric: She has a normal mood and affect. Her behavior is normal.  Vitals reviewed.   RECENT LABS AND TESTS: BMET    Component Value Date/Time   NA 142 06/05/2018 0902   K 4.3 06/05/2018 0902   CL 102 06/05/2018 0902   CO2 25 06/05/2018 0902   GLUCOSE 108 (H) 06/05/2018 0902   GLUCOSE 111 (H) 06/11/2017 0950   GLUCOSE 95 05/11/2006 1148   BUN 18 06/05/2018 0902   CREATININE 0.94 06/05/2018 0902   CREATININE 0.96 05/12/2016 1612    CALCIUM 9.4 06/05/2018 0902   GFRNONAA 67 06/05/2018 0902   GFRAA 77 06/05/2018 0902   Lab Results  Component Value Date   HGBA1C 5.4 06/05/2018   HGBA1C 5.3 09/10/2017   Lab Results  Component Value Date   INSULIN 37.8 (H) 06/05/2018   INSULIN 27.9 (H) 02/04/2018   INSULIN 39.9 (H) 09/10/2017   CBC    Component Value Date/Time   WBC 6.6 09/10/2017 0942   WBC 8.7 05/12/2016 1612   RBC 4.61 09/10/2017 0942   RBC 4.69 05/12/2016 1612   HGB 14.5 09/10/2017 0942   HCT 44.2 09/10/2017 0942   PLT 316 05/12/2016 1612   MCV 96 09/10/2017 0942   MCH 31.5 09/10/2017  0942   MCH 30.7 05/12/2016 1612   MCHC 32.8 09/10/2017 0942   MCHC 33.5 05/12/2016 1612   RDW 13.4 09/10/2017 0942   LYMPHSABS 1.7 09/10/2017 0942   MONOABS 696 05/12/2016 1612   EOSABS 0.2 09/10/2017 0942   BASOSABS 0.1 09/10/2017 0942   Iron/TIBC/Ferritin/ %Sat No results found for: IRON, TIBC, FERRITIN, IRONPCTSAT Lipid Panel     Component Value Date/Time   CHOL 146 06/05/2018 0902   TRIG 87 06/05/2018 0902   HDL 52 06/05/2018 0902   CHOLHDL 3 06/11/2017 0950   VLDL 14.8 06/11/2017 0950   LDLCALC 77 06/05/2018 0902   Hepatic Function Panel     Component Value Date/Time   PROT 6.8 06/05/2018 0902   ALBUMIN 4.4 06/05/2018 0902   AST 21 06/05/2018 0902   ALT 25 06/05/2018 0902   ALKPHOS 115 06/05/2018 0902   BILITOT 0.6 06/05/2018 0902   BILIDIR 0.1 06/12/2014 1359      Component Value Date/Time   TSH 2.180 09/10/2017 0942   TSH 2.62 05/12/2016 1612   TSH 1.87 06/12/2014 1359   Results for [place], Jahnia R (MRN [id number]) as of 06/12/2018 06:26  Ref. Range 06/05/2018 09:02  Vitamin D, 25-Hydroxy Latest Ref Range: 30.0 - 100.0 ng/mL 40.4   ASSESSMENT AND PLAN: Insulin resistance - Plan: Insulin, random, metFORMIN (GLUCOPHAGE) 500 MG tablet  Vitamin D deficiency - Plan: VITAMIN D 25 Hydroxy (Vit-D Deficiency, Fractures), Vitamin D, Ergocalciferol, (DRISDOL) 1.25 MG (50000 UT) CAPS  capsule  Essential hypertension - Plan: Comprehensive metabolic panel, potassium chloride SA (K-DUR,KLOR-CON) 20 MEQ tablet  Other hyperlipidemia - Plan: Hemoglobin A1c, Lipid Panel With LDL/HDL Ratio  Other depression - with emotional eating  - Plan: buPROPion (WELLBUTRIN SR) 150 MG 12 hr tablet  At risk for osteoporosis  Class 3 severe obesity with serious comorbidity and body mass index (BMI) of 45.0 to 49.9 in adult, unspecified obesity type (HCC)  PLAN:  Insulin Resistance Sharnetta will continue to work on weight loss, exercise, and decreasing simple carbohydrates in her diet to help decrease the risk of diabetes.  She was informed that eating too many simple carbohydrates or too many calories at one sitting increases the likelihood of GI side effects. Lovette agreed to continue taking metformin and prescription was not written today. Audrey Duncan agreed to follow up with [place] as directed to monitor her progress in 3 weeks.  Vitamin D Deficiency Ralphine was informed that low vitamin D levels contributes to fatigue and are associated with obesity, breast, and colon cancer. She agrees to continue to take prescription Vit D @50 ,000 IU every week #4 with no refills and will follow up for routine testing of vitamin D, at least 2-3 times per year. She was informed of the risk of over-replacement of vitamin D and agrees to not increase her dose unless she discusses this with [place] first. Audrey Duncan agrees to follow up in 3 weeks.  At risk for osteopenia and osteoporosis Audrey Duncan was given extended (15 minutes) osteoporosis prevention counseling today. Audrey Duncan is at risk for osteopenia and osteoporosis due to her vitamin D deficiency. She was encouraged to take her vitamin D and follow her higher calcium diet and increase strengthening exercise to help strengthen her bones and decrease her risk of osteopenia and osteoporosis.  Hypertension We discussed sodium restriction, working on healthy weight loss,  and a regular exercise program as the means to achieve improved blood pressure control. We will continue to monitor her blood pressure as well as her progress  with the above lifestyle modifications. She will continue her medications as prescribed and we will refill potassium chloride 25mEQ #30 with no refills. She will watch for signs of hypotension as she continues her lifestyle modifications. We will check labs today. Audrey Duncan agreed with this plan and agreed to follow up as directed.  Hyperlipidemia Audrey Duncan was informed of the American Heart Association Guidelines emphasizing intensive lifestyle modifications as the first line treatment for hyperlipidemia. We discussed many lifestyle modifications today in depth, and Audrey Duncan will continue to work on decreasing saturated fats such as fatty red meat, butter and many fried foods. She will also increase vegetables and lean protein in her diet and continue to work on exercise and weight loss efforts. We will check labs today. Audrey Duncan agrees to continue with weight loss and to follow up in 3 weeks.  Depression with Emotional Eating Behaviors We discussed behavior modification techniques today to help Audrey Duncan deal with her emotional eating and depression. She has agreed to take bupropion SR 150 mg qd #30 with no refills and agreed to follow up as directed. We will check labs today.  Obesity Audrey Duncan is currently in the action stage of change. As such, her goal is to continue with weight loss efforts. She has agreed to follow the Category 2 plan. Audrey Duncan has been instructed to work up to a goal of 150 minutes of combined cardio and strengthening exercise per week for weight loss and overall health benefits. We discussed the following Behavioral Modification Strategies today: work on meal planning and easy cooking plans and holiday eating strategies.   Audrey Duncan has agreed to follow up with our clinic in 3 weeks. She was informed of the importance of  frequent follow up visits to maximize her success with intensive lifestyle modifications for her multiple health conditions.   OBESITY BEHAVIORAL INTERVENTION VISIT  Today's visit was # 12   Starting weight: 279 lbs  Starting date: 09/10/17 Today's weight : Weight: 265 lb (120.2 kg)  Today's date: 06/05/2018 Total lbs lost to date: 14  ASK: We discussed the diagnosis of obesity with Audrey Duncan today and Audrey Duncan agreed to give [place] permission to discuss obesity behavioral modification therapy today.  ASSESS: Shir has the diagnosis of obesity and her BMI today is 45.58. Nely is in the action stage of change.   ADVISE: Renise was educated on the multiple health risks of obesity as well as the benefit of weight loss to improve her health. She was advised of the need for long term treatment and the importance of lifestyle modifications to improve her current health and to decrease her risk of future health problems.  AGREE: Multiple dietary modification options and treatment options were discussed and Elinore agreed to follow the recommendations documented in the above note.  ARRANGE: Quantasia was educated on the importance of frequent visits to treat obesity as outlined per CMS and USPSTF guidelines and agreed to schedule her next follow up appointment today.  Lenward Chancellor, am acting as transcriptionist for Abby Potash, PA-C I, Abby Potash, PA-C have reviewed above note and agree with its content

## 2018-06-27 ENCOUNTER — Ambulatory Visit (INDEPENDENT_AMBULATORY_CARE_PROVIDER_SITE_OTHER): Payer: 59 | Admitting: Physician Assistant

## 2018-06-27 ENCOUNTER — Encounter (INDEPENDENT_AMBULATORY_CARE_PROVIDER_SITE_OTHER): Payer: Self-pay

## 2018-07-07 ENCOUNTER — Encounter (INDEPENDENT_AMBULATORY_CARE_PROVIDER_SITE_OTHER): Payer: Self-pay | Admitting: Physician Assistant

## 2018-07-08 ENCOUNTER — Other Ambulatory Visit (INDEPENDENT_AMBULATORY_CARE_PROVIDER_SITE_OTHER): Payer: Self-pay

## 2018-07-08 DIAGNOSIS — E559 Vitamin D deficiency, unspecified: Secondary | ICD-10-CM

## 2018-07-08 DIAGNOSIS — F3289 Other specified depressive episodes: Secondary | ICD-10-CM

## 2018-07-08 DIAGNOSIS — I10 Essential (primary) hypertension: Secondary | ICD-10-CM

## 2018-07-08 DIAGNOSIS — E8881 Metabolic syndrome: Secondary | ICD-10-CM

## 2018-07-08 MED ORDER — METFORMIN HCL 500 MG PO TABS: 500.0000 mg | ORAL_TABLET | Freq: Every day | ORAL | 0 refills | Status: DC

## 2018-07-08 MED ORDER — POTASSIUM CHLORIDE CRYS ER 20 MEQ PO TBCR: EXTENDED_RELEASE_TABLET | ORAL | 0 refills | Status: DC

## 2018-07-08 MED ORDER — HYDROCHLOROTHIAZIDE 25 MG PO TABS: 25.0000 mg | ORAL_TABLET | Freq: Every day | ORAL | 0 refills | Status: DC

## 2018-07-08 MED ORDER — VITAMIN D (ERGOCALCIFEROL) 1.25 MG (50000 UNIT) PO CAPS: 50000.0000 [IU] | ORAL_CAPSULE | ORAL | 0 refills | Status: DC

## 2018-07-08 MED ORDER — BUPROPION HCL ER (SR) 150 MG PO TB12: 150.0000 mg | ORAL_TABLET | Freq: Every day | ORAL | 0 refills | Status: DC

## 2018-07-16 ENCOUNTER — Ambulatory Visit (INDEPENDENT_AMBULATORY_CARE_PROVIDER_SITE_OTHER): Payer: 59 | Admitting: Family Medicine

## 2018-07-18 ENCOUNTER — Encounter (INDEPENDENT_AMBULATORY_CARE_PROVIDER_SITE_OTHER): Payer: Self-pay | Admitting: Family Medicine

## 2018-07-18 ENCOUNTER — Ambulatory Visit (INDEPENDENT_AMBULATORY_CARE_PROVIDER_SITE_OTHER): Payer: 59 | Admitting: Family Medicine

## 2018-07-18 VITALS — BP 131/85 | HR 74 | Temp 97.8°F | Ht 64.0 in | Wt 264.0 lb

## 2018-07-18 DIAGNOSIS — Z6841 Body Mass Index (BMI) 40.0 and over, adult: Secondary | ICD-10-CM

## 2018-07-18 DIAGNOSIS — E559 Vitamin D deficiency, unspecified: Secondary | ICD-10-CM | POA: Diagnosis not present

## 2018-07-18 DIAGNOSIS — Z9189 Other specified personal risk factors, not elsewhere classified: Secondary | ICD-10-CM

## 2018-07-18 DIAGNOSIS — E8881 Metabolic syndrome: Secondary | ICD-10-CM | POA: Diagnosis not present

## 2018-07-20 NOTE — Progress Notes (Signed)
Office: [phone]  /  Fax: [phone]   HPI:   Chief Complaint: OBESITY Audrey Duncan is here to discuss her progress with her obesity treatment plan. She is on the Category 2 plan and is following her eating plan approximately 90% of the time. She states she is exercising 0 minutes 0 times per week. Audrey Duncan is happy with weight loss. She is doing well on Category 2.  Her weight is 264 lb (119.7 kg) today and has had a weight loss of 1 pound over a period of 6 weeks since her last visit. She has lost 15 lbs since starting treatment with [place].  Insulin Resistance Audrey Duncan has a diagnosis of insulin resistance based on her elevated fasting insulin level >5. Although Audrey Duncan's blood glucose readings are still under good control, insulin resistance puts her at greater risk of metabolic syndrome and diabetes. She is taking metformin currently and she denies polyphagia. She continues to work on diet and exercise to decrease risk of diabetes.  Vitamin D Deficiency Audrey Duncan has a diagnosis of vitamin D deficiency. She is currently taking prescription Vit D, level not at goal but improved. She has been on prescription Vit D for several months. She notes mild fatigue and denies nausea, vomiting or muscle weakness.  At risk for osteopenia and osteoporosis Audrey Duncan is at higher risk of osteopenia and osteoporosis due to vitamin D deficiency.   ALLERGIES: No Known Allergies  MEDICATIONS: Current Outpatient Medications on File Prior to Visit  Medication Sig Dispense Refill  . buPROPion (WELLBUTRIN SR) 150 MG 12 hr tablet Take 1 tablet (150 mg total) by mouth daily. 30 tablet 0  . hydrochlorothiazide (HYDRODIURIL) 25 MG tablet Take 1 tablet (25 mg total) by mouth daily. 30 tablet 0  . Magnesium 250 MG TABS Take 1 tablet by mouth daily.    . metFORMIN (GLUCOPHAGE) 500 MG tablet Take 1 tablet (500 mg total) by mouth daily with breakfast. 30 tablet 0  . polyethylene glycol powder (GLYCOLAX/MIRALAX)  powder Take 17 g by mouth daily. 3350 g 0  . potassium chloride SA (K-DUR,KLOR-CON) 20 MEQ tablet TAKE 1 TABLET(20 MEQ) BY MOUTH DAILY 30 tablet 0  . Probiotic Product (ALIGN PO) Take 1 capsule by mouth daily.    . Vitamin D, Ergocalciferol, (DRISDOL) 1.25 MG (50000 UT) CAPS capsule Take 1 capsule (50,000 Units total) by mouth every 7 (seven) days. 4 capsule 0   Current Facility-Administered Medications on File Prior to Visit  Medication Dose Route Frequency Provider Last Rate Last Dose  . 0.9 %  sodium chloride infusion  500 mL Intravenous Continuous Doran Stabler, MD        PAST MEDICAL HISTORY: Past Medical History:  Diagnosis Date  . Asthma   . Chiari malformation type II ([place])    diagnosed when in high school  . Chiari malformation type II ([place])   . Colon polyp 2006   (TUBULAR ADENOMA)--COLONOSCOPY-DR. HAYES  . Constipation   . Fluid retention   . H/O seasonal allergies   . Medial meniscus tear    left  . Over weight   . Ulcerative colitis ([place])     PAST SURGICAL HISTORY: Past Surgical History:  Procedure Laterality Date  . ABDOMINAL HYSTERECTOMY  1999   complete  . [place]  . HERNIA REPAIR  6812   umbilical    SOCIAL HISTORY: Social History   Tobacco Use  . Smoking status: Never Smoker  . Smokeless tobacco: Never Used  Substance Use Topics  . Alcohol use: Yes    Comment: 1 glass of wine 1-2 x a month  . Drug use: No    FAMILY HISTORY: Family History  Problem Relation Age of Onset  . Heart disease Mother 101       2 MIs   . Stroke Mother 91  . Hypertension Mother   . Cancer Mother        lung, lymphoma  . Lung cancer Mother        was a smoker  . Kidney disease Mother   . Obesity Mother   . COPD Father        smoker  . Cancer Father        colon, lung prostate  . Alcohol abuse Father   . Hypertension Father   . Obesity Father   . Asthma Sister   . Alcohol abuse Brother   . Diabetes Paternal Uncle   . Breast  cancer Neg Hx     ROS: Review of Systems  Constitutional: Positive for malaise/fatigue and weight loss.  Gastrointestinal: Negative for nausea and vomiting.  Musculoskeletal:       Negative muscle weakness  Endo/Heme/Allergies:       Negative polyphagia    PHYSICAL EXAM: Blood pressure 131/85, pulse 74, temperature 97.8 F (36.6 C), temperature source Oral, height 5\' 4"  (1.626 m), weight 264 lb (119.7 kg), SpO2 95 %. Body mass index is 45.32 kg/m. Physical Exam Vitals signs reviewed.  Constitutional:      Appearance: Normal appearance. She is obese.  Cardiovascular:     Rate and Rhythm: Normal rate.     Pulses: Normal pulses.  Pulmonary:     Effort: Pulmonary effort is normal.     Breath sounds: Normal breath sounds.  Musculoskeletal: Normal range of motion.  Skin:    General: Skin is warm and dry.  Neurological:     Mental Status: She is alert and oriented to person, place, and time.  Psychiatric:        Mood and Affect: Mood normal.        Behavior: Behavior normal.     RECENT LABS AND TESTS: BMET    Component Value Date/Time   NA 142 06/05/2018 0902   K 4.3 06/05/2018 0902   CL 102 06/05/2018 0902   CO2 25 06/05/2018 0902   GLUCOSE 108 (H) 06/05/2018 0902   GLUCOSE 111 (H) 06/11/2017 0950   GLUCOSE 95 05/11/2006 1148   BUN 18 06/05/2018 0902   CREATININE 0.94 06/05/2018 0902   CREATININE 0.96 05/12/2016 1612   CALCIUM 9.4 06/05/2018 0902   GFRNONAA 67 06/05/2018 0902   GFRAA 77 06/05/2018 0902   Lab Results  Component Value Date   HGBA1C 5.4 06/05/2018   HGBA1C 5.3 09/10/2017   Lab Results  Component Value Date   INSULIN 37.8 (H) 06/05/2018   INSULIN 27.9 (H) 02/04/2018   INSULIN 39.9 (H) 09/10/2017   CBC    Component Value Date/Time   WBC 6.6 09/10/2017 0942   WBC 8.7 05/12/2016 1612   RBC 4.61 09/10/2017 0942   RBC 4.69 05/12/2016 1612   HGB 14.5 09/10/2017 0942   HCT 44.2 09/10/2017 0942   PLT 316 05/12/2016 1612   MCV 96 09/10/2017  0942   MCH 31.5 09/10/2017 0942   MCH 30.7 05/12/2016 1612   MCHC 32.8 09/10/2017 0942   MCHC 33.5 05/12/2016 1612   RDW 13.4 09/10/2017 0942   LYMPHSABS 1.7 09/10/2017 0942   MONOABS  696 05/12/2016 1612   EOSABS 0.2 09/10/2017 0942   BASOSABS 0.1 09/10/2017 0942   Iron/TIBC/Ferritin/ %Sat No results found for: IRON, TIBC, FERRITIN, IRONPCTSAT Lipid Panel     Component Value Date/Time   CHOL 146 06/05/2018 0902   TRIG 87 06/05/2018 0902   HDL 52 06/05/2018 0902   CHOLHDL 3 06/11/2017 0950   VLDL 14.8 06/11/2017 0950   LDLCALC 77 06/05/2018 0902   Hepatic Function Panel     Component Value Date/Time   PROT 6.8 06/05/2018 0902   ALBUMIN 4.4 06/05/2018 0902   AST 21 06/05/2018 0902   ALT 25 06/05/2018 0902   ALKPHOS 115 06/05/2018 0902   BILITOT 0.6 06/05/2018 0902   BILIDIR 0.1 06/12/2014 1359      Component Value Date/Time   TSH 2.180 09/10/2017 0942   TSH 2.62 05/12/2016 1612   TSH 1.87 06/12/2014 1359    ASSESSMENT AND PLAN: Insulin resistance  Vitamin D deficiency  At risk for osteoporosis  Class 3 severe obesity with serious comorbidity and body mass index (BMI) of 45.0 to 49.9 in adult, unspecified obesity type ([place])  PLAN:  Insulin Resistance Marijo will continue meal plan, and will continue to work on weight loss, exercise, and decreasing simple carbohydrates in her diet to help decrease the risk of diabetes. Dhruvi agrees to continue taking metformin, and she agrees to follow up with our clinic in 3 weeks as directed to monitor her progress.  Vitamin D Deficiency Ellee was informed that low vitamin D levels contributes to fatigue and are associated with obesity, breast, and colon cancer. Malyssa agrees to continue taking prescription Vit D @50 ,000 IU every week and add OTC Vit D3 2,000 IU daily. She will follow up for routine testing of vitamin D, at least 2-3 times per year. She was informed of the risk of over-replacement of vitamin D and  agrees to not increase her dose unless she discusses this with [place] first. Brycelynn agrees to follow up with our clinic in 3 weeks.  At risk for osteopenia and osteoporosis Sharelle was given extended (15 minutes) osteoporosis prevention counseling today. Haly is at risk for osteopenia and osteoporsis due to her vitamin D deficiency. She was encouraged to take her vitamin D and follow her higher calcium diet and increase strengthening exercise to help strengthen her bones and decrease her risk of osteopenia and osteoporosis.  Obesity Lunden is currently in the action stage of change. As such, her goal is to continue with weight loss efforts She has agreed to follow the Category 2 plan Bentleigh has not been prescribed exercise at this time. We discussed the following Behavioral Modification Strategies today: planning for success We went over labs, liver enzymes improved as well as fasting insulin.  Wrenn has agreed to follow up with our clinic in 3 weeks. She was informed of the importance of frequent follow up visits to maximize her success with intensive lifestyle modifications for her multiple health conditions.   OBESITY BEHAVIORAL INTERVENTION VISIT  Today's visit was # 13  Starting weight: 279 lbs Starting date: 09/10/17 Today's weight : 264 lbs  Today's date: 07/18/2018 Total lbs lost to date: 15    ASK: We discussed the diagnosis of obesity with Felipa Furnace today and Anderson Malta agreed to give [place] permission to discuss obesity behavioral modification therapy today.  ASSESS: Malana has the diagnosis of obesity and her BMI today is 45.29 Kateryna is in the action stage of change   ADVISE: Mykiah was educated  on the multiple health risks of obesity as well as the benefit of weight loss to improve her health. She was advised of the need for long term treatment and the importance of lifestyle modifications to improve her current health and to decrease her risk of  future health problems.  AGREE: Multiple dietary modification options and treatment options were discussed and  Janya agreed to follow the recommendations documented in the above note.  ARRANGE: Dennie was educated on the importance of frequent visits to treat obesity as outlined per CMS and USPSTF guidelines and agreed to schedule her next follow up appointment today.  Wilhemena Durie, am acting as Location manager for [employer], FNP-C.  I have reviewed the above documentation for accuracy and completeness, and I agree with the above.  - Jorden Minchey, FNP-C.

## 2018-07-25 ENCOUNTER — Encounter (INDEPENDENT_AMBULATORY_CARE_PROVIDER_SITE_OTHER): Payer: Self-pay | Admitting: Family Medicine

## 2018-07-25 DIAGNOSIS — E88819 Insulin resistance, unspecified: Secondary | ICD-10-CM | POA: Insufficient documentation

## 2018-07-25 DIAGNOSIS — E8881 Metabolic syndrome: Secondary | ICD-10-CM | POA: Insufficient documentation

## 2018-08-03 ENCOUNTER — Encounter (INDEPENDENT_AMBULATORY_CARE_PROVIDER_SITE_OTHER): Payer: Self-pay | Admitting: Physician Assistant

## 2018-08-05 ENCOUNTER — Other Ambulatory Visit (INDEPENDENT_AMBULATORY_CARE_PROVIDER_SITE_OTHER): Payer: Self-pay

## 2018-08-05 DIAGNOSIS — I10 Essential (primary) hypertension: Secondary | ICD-10-CM

## 2018-08-05 DIAGNOSIS — E559 Vitamin D deficiency, unspecified: Secondary | ICD-10-CM

## 2018-08-05 DIAGNOSIS — E8881 Metabolic syndrome: Secondary | ICD-10-CM

## 2018-08-05 MED ORDER — POTASSIUM CHLORIDE CRYS ER 20 MEQ PO TBCR: EXTENDED_RELEASE_TABLET | ORAL | 0 refills | Status: DC

## 2018-08-05 MED ORDER — METFORMIN HCL 500 MG PO TABS: 500.0000 mg | ORAL_TABLET | Freq: Every day | ORAL | 0 refills | Status: DC

## 2018-08-05 MED ORDER — VITAMIN D (ERGOCALCIFEROL) 1.25 MG (50000 UNIT) PO CAPS: 50000.0000 [IU] | ORAL_CAPSULE | ORAL | 0 refills | Status: DC

## 2018-08-05 MED ORDER — HYDROCHLOROTHIAZIDE 25 MG PO TABS: 25.0000 mg | ORAL_TABLET | Freq: Every day | ORAL | 0 refills | Status: DC

## 2018-08-08 ENCOUNTER — Ambulatory Visit (INDEPENDENT_AMBULATORY_CARE_PROVIDER_SITE_OTHER): Payer: 59 | Admitting: Family Medicine

## 2018-08-08 ENCOUNTER — Encounter (INDEPENDENT_AMBULATORY_CARE_PROVIDER_SITE_OTHER): Payer: Self-pay | Admitting: Family Medicine

## 2018-08-08 VITALS — BP 118/84 | HR 77 | Ht 64.0 in | Wt 264.0 lb

## 2018-08-08 DIAGNOSIS — F3289 Other specified depressive episodes: Secondary | ICD-10-CM

## 2018-08-08 DIAGNOSIS — Z6841 Body Mass Index (BMI) 40.0 and over, adult: Secondary | ICD-10-CM | POA: Diagnosis not present

## 2018-08-08 DIAGNOSIS — Z9189 Other specified personal risk factors, not elsewhere classified: Secondary | ICD-10-CM | POA: Diagnosis not present

## 2018-08-08 DIAGNOSIS — E559 Vitamin D deficiency, unspecified: Secondary | ICD-10-CM | POA: Diagnosis not present

## 2018-08-08 MED ORDER — BUPROPION HCL ER (SR) 150 MG PO TB12: 150.0000 mg | ORAL_TABLET | Freq: Every day | ORAL | 0 refills | Status: DC

## 2018-08-10 NOTE — Progress Notes (Signed)
Office: [phone]  /  Fax: [phone]   HPI:   Chief Complaint: OBESITY Audrey Duncan is here to discuss her progress with her obesity treatment plan. She is on the Category 2 plan and is following her eating plan approximately 90-93% of the time. She states she is walking for 30 minutes 5 times per week. Audrey Duncan has been sick with a GI bug and has been off the plan. She is now back on the plan.  Her weight is 264 lb (119.7 kg) today and has not lost weight since her last visit. She has lost 15 lbs since starting treatment with [place].  Vitamin D Deficiency Audrey Duncan has a diagnosis of vitamin D deficiency. She is currently taking prescription Vit D, but level not at goal. Last Vit D level was 40.4 on 06/05/18. She notes fatigue and denies nausea, vomiting or muscle weakness.  At risk for osteopenia and osteoporosis Audrey Duncan is at higher risk of osteopenia and osteoporosis due to vitamin D deficiency.   Depression with emotional eating behaviors Audrey Duncan feels bupropion has greatly decrease cravings. She denies insomnia.  She has been working on behavior modification techniques to help reduce her emotional eating and has been somewhat successful. She shows no sign of suicidal or homicidal ideations.  Depression screen [facility] 2/9 09/10/2017 06/11/2017 05/12/2016 01/06/2013  Decreased Interest 2 0 0 0  Down, Depressed, Hopeless 1 0 0 0  PHQ - 2 Score 3 0 0 0  Altered sleeping 2 - - -  Tired, decreased energy 3 - - -  Change in appetite 1 - - -  Feeling bad or failure about yourself  2 - - -  Trouble concentrating 0 - - -  Moving slowly or fidgety/restless 0 - - -  Suicidal thoughts 0 - - -  PHQ-9 Score 11 - - -  Difficult doing work/chores Not difficult at all - - -    ALLERGIES: No Known Allergies  MEDICATIONS: Current Outpatient Medications on File Prior to Visit  Medication Sig Dispense Refill  . hydrochlorothiazide (HYDRODIURIL) 25 MG tablet Take 1 tablet (25 mg total) by mouth  daily. 30 tablet 0  . Magnesium 250 MG TABS Take 1 tablet by mouth daily.    . metFORMIN (GLUCOPHAGE) 500 MG tablet Take 1 tablet (500 mg total) by mouth daily with breakfast. 30 tablet 0  . polyethylene glycol powder (GLYCOLAX/MIRALAX) powder Take 17 g by mouth daily. 3350 g 0  . potassium chloride SA (K-DUR,KLOR-CON) 20 MEQ tablet TAKE 1 TABLET(20 MEQ) BY MOUTH DAILY 30 tablet 0  . Probiotic Product (ALIGN PO) Take 1 capsule by mouth daily.    . Vitamin D, Ergocalciferol, (DRISDOL) 1.25 MG (50000 UT) CAPS capsule Take 1 capsule (50,000 Units total) by mouth every 7 (seven) days. 4 capsule 0   Current Facility-Administered Medications on File Prior to Visit  Medication Dose Route Frequency Provider Last Rate Last Dose  . 0.9 %  sodium chloride infusion  500 mL Intravenous Continuous Doran Stabler, MD        PAST MEDICAL HISTORY: Past Medical History:  Diagnosis Date  . Asthma   . Chiari malformation type II ([place])    diagnosed when in high school  . Chiari malformation type II ([place])   . Colon polyp 2006   (TUBULAR ADENOMA)--COLONOSCOPY-DR. HAYES  . Constipation   . Fluid retention   . H/O seasonal allergies   . Medial meniscus tear    left  . Over weight   .  Ulcerative colitis ([place])     PAST SURGICAL HISTORY: Past Surgical History:  Procedure Laterality Date  . ABDOMINAL HYSTERECTOMY  1999   complete  . [place]  . HERNIA REPAIR  [id number]   umbilical    SOCIAL HISTORY: Social History   Tobacco Use  . Smoking status: Never Smoker  . Smokeless tobacco: Never Used  Substance Use Topics  . Alcohol use: Yes    Comment: 1 glass of wine 1-2 x a month  . Drug use: No    FAMILY HISTORY: Family History  Problem Relation Age of Onset  . Heart disease Mother 66       2 MIs   . Stroke Mother 48  . Hypertension Mother   . Cancer Mother        lung, lymphoma  . Lung cancer Mother        was a smoker  . Kidney disease Mother   . Obesity Mother    . COPD Father        smoker  . Cancer Father        colon, lung prostate  . Alcohol abuse Father   . Hypertension Father   . Obesity Father   . Asthma Sister   . Alcohol abuse Brother   . Diabetes Paternal Uncle   . Breast cancer Neg Hx     ROS: Review of Systems  Constitutional: Positive for malaise/fatigue. Negative for weight loss.  Gastrointestinal: Negative for nausea and vomiting.  Musculoskeletal:       Negative muscle weakness  Psychiatric/Behavioral: Positive for depression. Negative for suicidal ideas. The patient does not have insomnia.     PHYSICAL EXAM: Blood pressure 118/84, pulse 77, height 5\' 4"  (1.626 m), weight 264 lb (119.7 kg), SpO2 96 %. Body mass index is 45.32 kg/m. Physical Exam Vitals signs reviewed.  Constitutional:      Appearance: Normal appearance. She is obese.  Cardiovascular:     Rate and Rhythm: Normal rate.     Pulses: Normal pulses.  Pulmonary:     Effort: Pulmonary effort is normal.     Breath sounds: Normal breath sounds.  Musculoskeletal: Normal range of motion.  Skin:    General: Skin is warm and dry.  Neurological:     Mental Status: She is alert and oriented to person, place, and time.  Psychiatric:        Mood and Affect: Mood normal.        Behavior: Behavior normal.     RECENT LABS AND TESTS: BMET    Component Value Date/Time   NA 142 06/05/2018 0902   K 4.3 06/05/2018 0902   CL 102 06/05/2018 0902   CO2 25 06/05/2018 0902   GLUCOSE 108 (H) 06/05/2018 0902   GLUCOSE 111 (H) 06/11/2017 0950   GLUCOSE 95 05/11/2006 1148   BUN 18 06/05/2018 0902   CREATININE 0.94 06/05/2018 0902   CREATININE 0.96 05/12/2016 1612   CALCIUM 9.4 06/05/2018 0902   GFRNONAA 67 06/05/2018 0902   GFRAA 77 06/05/2018 0902   Lab Results  Component Value Date   HGBA1C 5.4 06/05/2018   HGBA1C 5.3 09/10/2017   Lab Results  Component Value Date   INSULIN 37.8 (H) 06/05/2018   INSULIN 27.9 (H) 02/04/2018   INSULIN 39.9 (H)  09/10/2017   CBC    Component Value Date/Time   WBC 6.6 09/10/2017 0942   WBC 8.7 05/12/2016 1612   RBC 4.61 09/10/2017 0942   RBC  4.69 05/12/2016 1612   HGB 14.5 09/10/2017 0942   HCT 44.2 09/10/2017 0942   PLT 316 05/12/2016 1612   MCV 96 09/10/2017 0942   MCH 31.5 09/10/2017 0942   MCH 30.7 05/12/2016 1612   MCHC 32.8 09/10/2017 0942   MCHC 33.5 05/12/2016 1612   RDW 13.4 09/10/2017 0942   LYMPHSABS 1.7 09/10/2017 0942   MONOABS 696 05/12/2016 1612   EOSABS 0.2 09/10/2017 0942   BASOSABS 0.1 09/10/2017 0942   Iron/TIBC/Ferritin/ %Sat No results found for: IRON, TIBC, FERRITIN, IRONPCTSAT Lipid Panel     Component Value Date/Time   CHOL 146 06/05/2018 0902   TRIG 87 06/05/2018 0902   HDL 52 06/05/2018 0902   CHOLHDL 3 06/11/2017 0950   VLDL 14.8 06/11/2017 0950   LDLCALC 77 06/05/2018 0902   Hepatic Function Panel     Component Value Date/Time   PROT 6.8 06/05/2018 0902   ALBUMIN 4.4 06/05/2018 0902   AST 21 06/05/2018 0902   ALT 25 06/05/2018 0902   ALKPHOS 115 06/05/2018 0902   BILITOT 0.6 06/05/2018 0902   BILIDIR 0.1 06/12/2014 1359      Component Value Date/Time   TSH 2.180 09/10/2017 0942   TSH 2.62 05/12/2016 1612   TSH 1.87 06/12/2014 1359    ASSESSMENT AND PLAN: Vitamin D deficiency  Other depression - with emotional eating  - Plan: buPROPion (WELLBUTRIN SR) 150 MG 12 hr tablet  At risk for osteoporosis  Class 3 severe obesity with serious comorbidity and body mass index (BMI) of 45.0 to 49.9 in adult, unspecified obesity type ([place])  PLAN:  Vitamin D Deficiency Evon was informed that low vitamin D levels contributes to fatigue and are associated with obesity, breast, and colon cancer. Jamelyn agrees to continue taking prescription Vit D @50 ,000 IU every week and will follow up for routine testing of vitamin D, at least 2-3 times per year. She was informed of the risk of over-replacement of vitamin D and agrees to not increase her dose  unless she discusses this with [place] first. Tyreanna agrees to follow up with our clinic in 3 weeks.  At risk for osteopenia and osteoporosis Nikiesha was given extended (15 minutes) osteoporosis prevention counseling today. Deziya is at risk for osteopenia and osteoporsis due to her vitamin D deficiency. She was encouraged to take her vitamin D and follow her higher calcium diet and increase strengthening exercise to help strengthen her bones and decrease her risk of osteopenia and osteoporosis.  Depression with Emotional Eating Behaviors We discussed behavior modification techniques today to help Nelani deal with her emotional eating and depression. Kadejah agrees to continue taking Wellbutrin SR 150 mg q AM #30 and we will refill for 1 month. Symphanie agrees to follow up with our clinic in 3 weeks.  Obesity Emeline is currently in the action stage of change. As such, her goal is to continue with weight loss efforts She has agreed to keep a food journal with 400-500 calories and 35+ grams of protein at supper daily and follow the Category 2 plan Jocelyne will continue current exercise regimen for weight loss and overall health benefits. We discussed the following Behavioral Modification Strategies today: increasing lean protein intake, increase H20 intake, and planning for success Handouts given: Journaling, Dining Out, and Protein Content.  Cooper has agreed to follow up with our clinic in 3 weeks. She was informed of the importance of frequent follow up visits to maximize her success with intensive lifestyle modifications for her multiple  health conditions.   OBESITY BEHAVIORAL INTERVENTION VISIT  Today's visit was # 14  Starting weight: 279 lbs Starting date: 09/10/17 Today's weight : 264 lbs  Today's date: 08/08/2018 Total lbs lost to date: 15    ASK: We discussed the diagnosis of obesity with Felipa Furnace today and Anderson Malta agreed to give [place] permission to discuss  obesity behavioral modification therapy today.  ASSESS: Marchelle has the diagnosis of obesity and her BMI today is 45.29 Zamirah is in the action stage of change   ADVISE: Kerri was educated on the multiple health risks of obesity as well as the benefit of weight loss to improve her health. She was advised of the need for long term treatment and the importance of lifestyle modifications to improve her current health and to decrease her risk of future health problems.  AGREE: Multiple dietary modification options and treatment options were discussed and  Zakariah agreed to follow the recommendations documented in the above note.  ARRANGE: British was educated on the importance of frequent visits to treat obesity as outlined per CMS and USPSTF guidelines and agreed to schedule her next follow up appointment today.  Wilhemena Durie, am acting as Location manager for [employer], FNP-C.  I have reviewed the above documentation for accuracy and completeness, and I agree with the above.  - Jaquaveon Bilal, FNP-C.

## 2018-08-12 ENCOUNTER — Encounter (INDEPENDENT_AMBULATORY_CARE_PROVIDER_SITE_OTHER): Payer: Self-pay | Admitting: Family Medicine

## 2018-08-12 DIAGNOSIS — F329 Major depressive disorder, single episode, unspecified: Secondary | ICD-10-CM | POA: Insufficient documentation

## 2018-08-12 DIAGNOSIS — F32A Depression, unspecified: Secondary | ICD-10-CM | POA: Insufficient documentation

## 2018-08-27 ENCOUNTER — Other Ambulatory Visit (INDEPENDENT_AMBULATORY_CARE_PROVIDER_SITE_OTHER): Payer: Self-pay | Admitting: Physician Assistant

## 2018-08-27 DIAGNOSIS — E8881 Metabolic syndrome: Secondary | ICD-10-CM

## 2018-08-27 DIAGNOSIS — I10 Essential (primary) hypertension: Secondary | ICD-10-CM

## 2018-09-04 ENCOUNTER — Encounter (INDEPENDENT_AMBULATORY_CARE_PROVIDER_SITE_OTHER): Payer: Self-pay | Admitting: Family Medicine

## 2018-09-04 ENCOUNTER — Ambulatory Visit (INDEPENDENT_AMBULATORY_CARE_PROVIDER_SITE_OTHER): Payer: 59 | Admitting: Family Medicine

## 2018-09-04 VITALS — BP 122/87 | HR 74 | Temp 98.1°F | Ht 64.0 in | Wt 264.0 lb

## 2018-09-04 DIAGNOSIS — Z6841 Body Mass Index (BMI) 40.0 and over, adult: Secondary | ICD-10-CM | POA: Diagnosis not present

## 2018-09-04 DIAGNOSIS — I10 Essential (primary) hypertension: Secondary | ICD-10-CM | POA: Diagnosis not present

## 2018-09-04 DIAGNOSIS — Z9189 Other specified personal risk factors, not elsewhere classified: Secondary | ICD-10-CM

## 2018-09-04 DIAGNOSIS — E559 Vitamin D deficiency, unspecified: Secondary | ICD-10-CM

## 2018-09-04 DIAGNOSIS — E8881 Metabolic syndrome: Secondary | ICD-10-CM | POA: Diagnosis not present

## 2018-09-04 MED ORDER — POTASSIUM CHLORIDE CRYS ER 20 MEQ PO TBCR: EXTENDED_RELEASE_TABLET | ORAL | 0 refills | Status: DC

## 2018-09-04 MED ORDER — VITAMIN D (ERGOCALCIFEROL) 1.25 MG (50000 UNIT) PO CAPS: 50000.0000 [IU] | ORAL_CAPSULE | ORAL | 0 refills | Status: DC

## 2018-09-04 MED ORDER — HYDROCHLOROTHIAZIDE 25 MG PO TABS: 25.0000 mg | ORAL_TABLET | Freq: Every day | ORAL | 0 refills | Status: DC

## 2018-09-04 MED ORDER — METFORMIN HCL 500 MG PO TABS: 500.0000 mg | ORAL_TABLET | Freq: Every day | ORAL | 0 refills | Status: DC

## 2018-09-04 NOTE — Progress Notes (Signed)
Office: [phone]  /  Fax: [phone]   HPI:   Chief Complaint: OBESITY Audrey Duncan is here to discuss her progress with her obesity treatment plan. She is on the Category 2 plan and is following her eating plan approximately 90% of the time. She states she is exercising 0 minutes 0 times per week. Audrey Duncan is very frustrated with lack of weight loss. She has lost only 15 lbs over the last year despite sticking to the program well. Her weight is 264 lb (119.7 kg) today and has not lost weight since her last visit. She has lost 15 lbs since starting treatment with [place].  Hypertension Audrey Duncan is a 58 y.o. female with hypertension which is well controlled on HCTZ.  Audrey Duncan denies chest pain or shortness of breath on exertion. She is working weight loss to help control her blood pressure with the goal of decreasing her risk of heart attack and stroke. Audrey Duncan blood pressure is currently controlled. She denies nausea, vomiting, or muscle weakness.  Vitamin D deficiency Audrey Duncan has a diagnosis of Vitamin D deficiency with her last level reported at 40.4 on 06/05/2018. She is currently taking prescription Vit D and denies nausea, vomiting or muscle weakness.  Insulin Resistance Audrey Duncan has a diagnosis of insulin resistance based on her elevated fasting insulin level >5. Although Audrey Duncan's blood glucose readings are still under good control, insulin resistance puts her at greater risk of metabolic syndrome and diabetes. She is taking metformin currently and continues to work on diet and exercise to decrease risk of diabetes. Audrey Duncan denies polyphagia. Lab Results  Component Value Date   HGBA1C 5.4 06/05/2018    At risk for diabetes Audrey Duncan is at higher than averagerisk for developing diabetes due to her obesity. She currently denies polyuria or polydipsia.  ASSESSMENT AND PLAN:  Essential hypertension - Plan: hydrochlorothiazide (HYDRODIURIL) 25 MG tablet,  potassium chloride SA (K-DUR,KLOR-CON) 20 MEQ tablet  Vitamin D deficiency - Plan: Vitamin D, Ergocalciferol, (DRISDOL) 1.25 MG (50000 UT) CAPS capsule  Insulin resistance - Plan: metFORMIN (GLUCOPHAGE) 500 MG tablet  At risk for diabetes mellitus  Class 3 severe obesity with serious comorbidity and body mass index (BMI) of 45.0 to 49.9 in adult, unspecified obesity type (Audrey Duncan)  PLAN:  Hypertension We discussed sodium restriction, working on healthy weight loss, and a regular exercise program as the means to achieve improved blood pressure control. Audrey Duncan agreed with this plan and agreed to follow up as directed. We will continue to monitor her blood pressure as well as her progress with the above lifestyle modifications. She was given a refill on her HCTZ 25 mg qd #30 with no refills and also a refill on her potassium 20 mEq qd #30 with no refills. Audrey Duncan will continue her medications as prescribed and will watch for signs of hypotension as she continues her lifestyle modifications. We will check a potassium level at her next visit.  Vitamin D Deficiency Audrey Duncan was informed that low Vitamin D levels contributes to fatigue and are associated with obesity, breast, and colon cancer. She agrees to continue to take prescription Vit D @ 50,000 IU every week #4 with no refills and will follow-up for routine testing of Vitamin D at her next visit. She was informed of the risk of over-replacement of Vitamin D and agrees to not increase her dose unless she discusses this with [place] first. Audrey Duncan agrees to follow-up with our clinic in 3 weeks.  Insulin Resistance Audrey Duncan will continue to work  on weight loss, exercise, and decreasing simple carbohydrates in her diet to help decrease the risk of diabetes. We dicussed metformin including benefits and risks. She was informed that eating too many simple carbohydrates or too many calories at one sitting increases the likelihood of GI side effects. Audrey Duncan  is on metformin for now and prescription was written today for 500 mg qd with breakfast #30 with no relfills. Audrey Duncan agrees to follow-up with our clinic in 3 weeks.   Diabetes risk counseling Audrey Duncan was given extended (15 minutes) diabetes prevention counseling today. She is 58 y.o. female and has risk factors for diabetes including obesity. We discussed intensive lifestyle modifications today with an emphasis on weight loss as well as increasing exercise and decreasing simple carbohydrates in her diet.  Obesity Audrey Duncan is currently in the action stage of change. As such, her goal is to continue with weight loss efforts. She has agreed to switch to the Category 1 plan + 100 calories (if needed) or journal 1000 calories + 75 mg of protein daily. Audrey Duncan has been instructed to increase exercise to 30 minutes 5 days a week of cardio.. We discussed the following Behavioral Modification Strategies today: planning for success. We will repeat IC at her next visit in 3 weeks.  Audrey Duncan has agreed to follow-up with our clinic in 3 weeks. She was informed of the importance of frequent follow up visits to maximize her success with intensive lifestyle modifications for her multiple health conditions.  ALLERGIES: No Known Allergies  MEDICATIONS: Current Outpatient Medications on File Prior to Visit  Medication Sig Dispense Refill  . buPROPion (WELLBUTRIN SR) 150 MG 12 hr tablet Take 1 tablet (150 mg total) by mouth daily. 30 tablet 0  . Magnesium 250 MG TABS Take 1 tablet by mouth daily.    . polyethylene glycol powder (GLYCOLAX/MIRALAX) powder Take 17 g by mouth daily. 3350 g 0  . Probiotic Product (ALIGN PO) Take 1 capsule by mouth daily.     Current Facility-Administered Medications on File Prior to Visit  Medication Dose Route Frequency Provider Last Rate Last Dose  . 0.9 %  sodium chloride infusion  500 mL Intravenous Continuous Doran Stabler, MD        PAST MEDICAL HISTORY: Past  Medical History:  Diagnosis Date  . Asthma   . Chiari malformation type II ([place])    diagnosed when in high school  . Chiari malformation type II ([place])   . Colon polyp 2006   (TUBULAR ADENOMA)--COLONOSCOPY-DR. HAYES  . Constipation   . Fluid retention   . H/O seasonal allergies   . Medial meniscus tear    left  . Over weight   . Ulcerative colitis ([place])     PAST SURGICAL HISTORY: Past Surgical History:  Procedure Laterality Date  . ABDOMINAL HYSTERECTOMY  1999   complete  . [place]  . HERNIA REPAIR  [id number]   umbilical    SOCIAL HISTORY: Social History   Tobacco Use  . Smoking status: Never Smoker  . Smokeless tobacco: Never Used  Substance Use Topics  . Alcohol use: Yes    Comment: 1 glass of wine 1-2 x a month  . Drug use: No    FAMILY HISTORY: Family History  Problem Relation Age of Onset  . Heart disease Mother 8       2 MIs   . Stroke Mother 88  . Hypertension Mother   . Cancer Mother  lung, lymphoma  . Lung cancer Mother        was a smoker  . Kidney disease Mother   . Obesity Mother   . COPD Father        smoker  . Cancer Father        colon, lung prostate  . Alcohol abuse Father   . Hypertension Father   . Obesity Father   . Asthma Sister   . Alcohol abuse Brother   . Diabetes Paternal Uncle   . Breast cancer Neg Hx    ROS: Review of Systems  Constitutional: Negative for weight loss.  Gastrointestinal: Negative for nausea and vomiting.  Musculoskeletal:       Negative for muscle weakness.  Endo/Heme/Allergies:       Negative for polyphagia. Negative for hypoglycemia.   PHYSICAL EXAM: Blood pressure 122/87, pulse 74, temperature 98.1 F (36.7 Audrey Duncan), height 5\' 4"  (1.626 m), weight 264 lb (119.7 kg), SpO2 96 %. Body mass index is 45.32 kg/m. Physical Exam Vitals signs reviewed.  Constitutional:      Appearance: Normal appearance. She is obese.  Cardiovascular:     Rate and Rhythm: Normal rate.      Pulses: Normal pulses.  Pulmonary:     Effort: Pulmonary effort is normal.     Breath sounds: Normal breath sounds.  Musculoskeletal: Normal range of motion.  Skin:    General: Skin is warm and dry.  Neurological:     Mental Status: She is alert and oriented to person, place, and time.  Psychiatric:        Behavior: Behavior normal.   RECENT LABS AND TESTS: BMET    Component Value Date/Time   NA 142 06/05/2018 0902   K 4.3 06/05/2018 0902   CL 102 06/05/2018 0902   CO2 25 06/05/2018 0902   GLUCOSE 108 (H) 06/05/2018 0902   GLUCOSE 111 (H) 06/11/2017 0950   GLUCOSE 95 05/11/2006 1148   BUN 18 06/05/2018 0902   CREATININE 0.94 06/05/2018 0902   CREATININE 0.96 05/12/2016 1612   CALCIUM 9.4 06/05/2018 0902   GFRNONAA 67 06/05/2018 0902   GFRAA 77 06/05/2018 0902   Lab Results  Component Value Date   HGBA1C 5.4 06/05/2018   HGBA1C 5.3 09/10/2017   Lab Results  Component Value Date   INSULIN 37.8 (H) 06/05/2018   INSULIN 27.9 (H) 02/04/2018   INSULIN 39.9 (H) 09/10/2017   CBC    Component Value Date/Time   WBC 6.6 09/10/2017 0942   WBC 8.7 05/12/2016 1612   RBC 4.61 09/10/2017 0942   RBC 4.69 05/12/2016 1612   HGB 14.5 09/10/2017 0942   HCT 44.2 09/10/2017 0942   PLT 316 05/12/2016 1612   MCV 96 09/10/2017 0942   MCH 31.5 09/10/2017 0942   MCH 30.7 05/12/2016 1612   MCHC 32.8 09/10/2017 0942   MCHC 33.5 05/12/2016 1612   RDW 13.4 09/10/2017 0942   LYMPHSABS 1.7 09/10/2017 0942   MONOABS 696 05/12/2016 1612   EOSABS 0.2 09/10/2017 0942   BASOSABS 0.1 09/10/2017 0942   Iron/TIBC/Ferritin/ %Sat No results found for: IRON, TIBC, FERRITIN, IRONPCTSAT Lipid Panel     Component Value Date/Time   CHOL 146 06/05/2018 0902   TRIG 87 06/05/2018 0902   HDL 52 06/05/2018 0902   CHOLHDL 3 06/11/2017 0950   VLDL 14.8 06/11/2017 0950   LDLCALC 77 06/05/2018 0902   Hepatic Function Panel     Component Value Date/Time   PROT 6.8 06/05/2018 0902  ALBUMIN 4.4  06/05/2018 0902   AST 21 06/05/2018 0902   ALT 25 06/05/2018 0902   ALKPHOS 115 06/05/2018 0902   BILITOT 0.6 06/05/2018 0902   BILIDIR 0.1 06/12/2014 1359      Component Value Date/Time   TSH 2.180 09/10/2017 0942   TSH 2.62 05/12/2016 1612   TSH 1.87 06/12/2014 1359    Ref. Range 06/05/2018 09:02  Vitamin D, 25-Hydroxy Latest Ref Range: 30.0 - 100.0 ng/mL 40.4   OBESITY BEHAVIORAL INTERVENTION VISIT  Today's visit was #15  Starting weight: 279 lbs Starting date: 09/10/2017 Today's weight: 264 lbs Today's date: 09/04/2018 Total lbs lost to date: 15    09/04/2018  Height 5\' 4"  (1.626 m)  Weight 264 lb (119.7 kg)  BMI (Calculated) 45.29  BLOOD PRESSURE - SYSTOLIC 616  BLOOD PRESSURE - DIASTOLIC 87   Body Fat % 07.3 %  Total Body Water (lbs) 89.8 lbs   ASK: We discussed the diagnosis of obesity with Audrey Duncan today and Audrey Duncan agreed to give [place] permission to discuss obesity behavioral modification therapy today.  ASSESS: Nancye has the diagnosis of obesity and her BMI today is 45.29. Corda is in the action stage of change.   ADVISE: Lilliam was educated on the multiple health risks of obesity as well as the benefit of weight loss to improve her health. She was advised of the need for long term treatment and the importance of lifestyle modifications to improve her current health and to decrease her risk of future health problems.  AGREE: Multiple dietary modification options and treatment options were discussed and  Emmelina agreed to follow the recommendations documented in the above note.  ARRANGE: Labria was educated on the importance of frequent visits to treat obesity as outlined per CMS and USPSTF guidelines and agreed to schedule her next follow up appointment today.  IMichaelene Song, am acting as Location manager for [employer], FNP-Audrey Duncan.  I have reviewed the above documentation for accuracy and completeness, and I agree with the above.  -   , FNP-Audrey Duncan.

## 2018-09-23 ENCOUNTER — Encounter (INDEPENDENT_AMBULATORY_CARE_PROVIDER_SITE_OTHER): Payer: Self-pay | Admitting: Physician Assistant

## 2018-09-26 ENCOUNTER — Ambulatory Visit (INDEPENDENT_AMBULATORY_CARE_PROVIDER_SITE_OTHER): Payer: 59 | Admitting: Physician Assistant

## 2018-09-27 ENCOUNTER — Encounter (INDEPENDENT_AMBULATORY_CARE_PROVIDER_SITE_OTHER): Payer: Self-pay | Admitting: Physician Assistant

## 2018-10-02 ENCOUNTER — Ambulatory Visit (INDEPENDENT_AMBULATORY_CARE_PROVIDER_SITE_OTHER): Payer: 59 | Admitting: Physician Assistant

## 2018-10-02 ENCOUNTER — Encounter (INDEPENDENT_AMBULATORY_CARE_PROVIDER_SITE_OTHER): Payer: Self-pay | Admitting: Physician Assistant

## 2018-10-02 ENCOUNTER — Other Ambulatory Visit: Payer: Self-pay

## 2018-10-02 DIAGNOSIS — R69 Illness, unspecified: Secondary | ICD-10-CM | POA: Diagnosis not present

## 2018-10-02 DIAGNOSIS — E559 Vitamin D deficiency, unspecified: Secondary | ICD-10-CM

## 2018-10-02 DIAGNOSIS — F3289 Other specified depressive episodes: Secondary | ICD-10-CM | POA: Diagnosis not present

## 2018-10-02 DIAGNOSIS — E8881 Metabolic syndrome: Secondary | ICD-10-CM | POA: Diagnosis not present

## 2018-10-02 DIAGNOSIS — Z6841 Body Mass Index (BMI) 40.0 and over, adult: Secondary | ICD-10-CM

## 2018-10-02 DIAGNOSIS — I10 Essential (primary) hypertension: Secondary | ICD-10-CM

## 2018-10-02 MED ORDER — HYDROCHLOROTHIAZIDE 25 MG PO TABS: 25.0000 mg | ORAL_TABLET | Freq: Every day | ORAL | 0 refills | Status: DC

## 2018-10-02 MED ORDER — METFORMIN HCL 500 MG PO TABS: 500.0000 mg | ORAL_TABLET | Freq: Every day | ORAL | 0 refills | Status: DC

## 2018-10-02 MED ORDER — BUPROPION HCL ER (SR) 150 MG PO TB12: 150.0000 mg | ORAL_TABLET | Freq: Every day | ORAL | 0 refills | Status: DC

## 2018-10-02 MED ORDER — VITAMIN D (ERGOCALCIFEROL) 1.25 MG (50000 UNIT) PO CAPS: 50000.0000 [IU] | ORAL_CAPSULE | ORAL | 0 refills | Status: DC

## 2018-10-02 MED ORDER — POTASSIUM CHLORIDE CRYS ER 20 MEQ PO TBCR: EXTENDED_RELEASE_TABLET | ORAL | 0 refills | Status: DC

## 2018-10-03 NOTE — Progress Notes (Addendum)
Office: [phone]  /  Fax: [phone] TeleHealth Visit:  Audrey Duncan has consented to this TeleHealth visit today via telephone call. The patient is located at home, the provider is located at the [facility] office. The participants in this visit include the listed provider and patient and provider's assistant.   HPI:   Chief Complaint: OBESITY Audrey Duncan is here to discuss her progress with her obesity treatment plan. She is on the keep a food journal with 1000 calories and 75 grams of protein daily and follow the Category 1 + 100 calories (if needed) plan and is following her eating plan approximately 85-90 % of the time. She states she is exercising 0 minutes 0 times per week. Audrey Duncan reports that she has maintained her weight. She is having trouble getting all of the food in on the plan due to lack of hunger and change in her schedule at work.  We were unable to weight the patient today for this TeleHealth visit. She feels as if she has maintained her weight since her last visit. She has lost 15 lbs since starting treatment with [place].  Hypertension Audrey Duncan is a 58 y.o. female with hypertension. Minnie states she is not check her blood pressure at home. She denies chest pain or headaches. She is working on weight loss to help control her blood pressure with the goal of decreasing her risk of heart attack and stroke.   Vitamin D Deficiency Audrey Duncan has a diagnosis of vitamin D deficiency. She is currently taking prescription Vit D and denies nausea, vomiting or muscle weakness.  Insulin Resistance Audrey Duncan has a diagnosis of insulin resistance based on her elevated fasting insulin level >5. Although Audrey Duncan's blood glucose readings are still under good control, insulin resistance puts her at greater risk of metabolic syndrome and diabetes. She denies nausea, vomiting, or diarrhea on metformin. She notes polyphagia and continues to work on diet and  exercise to decrease risk of diabetes.  Depression with Emotional Eating Behaviors Audrey Duncan is struggling with emotional eating and using food for comfort to the extent that it is negatively impacting her health. She often snacks when she is not hungry. Audrey Duncan sometimes feels she is out of control and then feels guilty that she made poor food choices. She has been working on behavior modification techniques to help reduce her emotional eating and has been somewhat successful. She denies cravings. She shows no sign of suicidal or homicidal ideations.  Depression screen Audrey Duncan 2/9 09/10/2017 06/11/2017 05/12/2016 01/06/2013  Decreased Interest 2 0 0 0  Down, Depressed, Hopeless 1 0 0 0  PHQ - 2 Score 3 0 0 0  Altered sleeping 2 - - -  Tired, decreased energy 3 - - -  Change in appetite 1 - - -  Feeling bad or failure about yourself  2 - - -  Trouble concentrating 0 - - -  Moving slowly or fidgety/restless 0 - - -  Suicidal thoughts 0 - - -  PHQ-9 Score 11 - - -  Difficult doing work/chores Not difficult at all - - -    ASSESSMENT AND PLAN:  Essential hypertension - Plan: Lipid Panel With LDL/HDL Ratio, VITAMIN D 25 Hydroxy (Vit-D Deficiency, Fractures), potassium chloride SA (K-DUR,KLOR-CON) 20 MEQ tablet, hydrochlorothiazide (HYDRODIURIL) 25 MG tablet  Vitamin D deficiency - Plan: Vitamin D, Ergocalciferol, (DRISDOL) 1.25 MG (50000 UT) CAPS capsule  Insulin resistance - Plan: Comprehensive metabolic panel, Hemoglobin A1c, Insulin, random, Lipid Panel With LDL/HDL  Ratio, metFORMIN (GLUCOPHAGE) 500 MG tablet  Other depression - with emotional eating - Plan: buPROPion (WELLBUTRIN SR) 150 MG 12 hr tablet  Class 3 severe obesity with serious comorbidity and body mass index (BMI) of 45.0 to 49.9 in adult, unspecified obesity type ([place])  PLAN:  Hypertension We discussed sodium restriction, working on healthy weight loss, and a regular exercise program as the means to achieve improved blood  pressure control. Audrey Duncan agreed with this plan and agreed to follow up as directed. We will continue to monitor her blood pressure as well as her progress with the above lifestyle modifications. Audrey Duncan agrees to continue taking hydrochlorothiazide 25 mg qd #30 and we will refill for 1 month, and she agrees to continue taking potassium chloride 20 meq qd #30 and we will refill for 1 month. She will watch for signs of hypotension as she continues her lifestyle modifications. Audrey Duncan agrees to follow up with our clinic in 3 weeks.  Vitamin D Deficiency Audrey Duncan was informed that low vitamin D levels contributes to fatigue and are associated with obesity, breast, and colon cancer. Audrey Duncan agrees to continue taking prescription Vit D @50 ,000 IU every week #4 and we will refill for 1 month. She will follow up for routine testing of vitamin D, at least 2-3 times per year. She was informed of the risk of over-replacement of vitamin D and agrees to not increase her dose unless she discusses this with [place] first. We will check labs. Audrey Duncan agrees to follow up with our clinic in 3 weeks.  Insulin Resistance Audrey Duncan will continue to work on weight loss, exercise, and decreasing simple carbohydrates in her diet to help decrease the risk of diabetes. We dicussed metformin including benefits and risks. She was informed that eating too many simple carbohydrates or too many calories at one sitting increases the likelihood of GI side effects. Audrey Duncan agrees to continue taking metformin 500 mg q AM #30 and we will refill for 1 month. We will check labs. Audrey Duncan agrees to follow up with our clinic in 3 weeks as directed to monitor her progress.  Depression with Emotional Eating Behaviors We discussed behavior modification techniques today to help Audrey Duncan deal with her emotional eating and depression. Audrey Duncan agrees to continue taking bupropion SR 150 mg qd #30 and we will refill for 1 month. Audrey Duncan agrees to  follow up with our clinic in 3 weeks.  Obesity Audrey Duncan is currently in the action stage of change. As such, her goal is to continue with weight loss efforts She has agreed to follow the Category 2 plan Audrey Duncan has been instructed to work up to a goal of 150 minutes of combined cardio and strengthening exercise per week for weight loss and overall health benefits. We discussed the following Behavioral Modification Strategies today: work on meal planning and easy cooking plans and keeping healthy foods in the home   Audrey Duncan has agreed to follow up with our clinic in 3 weeks. She was informed of the importance of frequent follow up visits to maximize her success with intensive lifestyle modifications for her multiple health conditions.  ALLERGIES: No Known Allergies  MEDICATIONS: Current Outpatient Medications on File Prior to Visit  Medication Sig Dispense Refill   Magnesium 250 MG TABS Take 1 tablet by mouth daily.     polyethylene glycol powder (GLYCOLAX/MIRALAX) powder Take 17 g by mouth daily. 3350 g 0   Probiotic Product (ALIGN PO) Take 1 capsule by mouth daily.     Current Facility-Administered Medications  on File Prior to Visit  Medication Dose Route Frequency Provider Last Rate Last Dose   0.9 %  sodium chloride infusion  500 mL Intravenous Continuous Danis, Kirke Corin, MD        PAST MEDICAL HISTORY: Past Medical History:  Diagnosis Date   Asthma    Chiari malformation type II ([place])    diagnosed when in high school   Chiari malformation type II ([place])    Colon polyp 2006   (TUBULAR ADENOMA)--COLONOSCOPY-DR. HAYES   Constipation    Fluid retention    H/O seasonal allergies    Medial meniscus tear    left   Over weight    Ulcerative colitis ([place])     PAST SURGICAL HISTORY: Past Surgical History:  Procedure Laterality Date   ABDOMINAL HYSTERECTOMY  1999   complete   CESAREAN SECTION  1995 and 1998   HERNIA REPAIR  4627   umbilical     SOCIAL HISTORY: Social History   Tobacco Use   Smoking status: Never Smoker   Smokeless tobacco: Never Used  Substance Use Topics   Alcohol use: Yes    Comment: 1 glass of wine 1-2 x a month   Drug use: No    FAMILY HISTORY: Family History  Problem Relation Age of Onset   Heart disease Mother 53       2 MIs    Stroke Mother 31   Hypertension Mother    Cancer Mother        lung, lymphoma   Lung cancer Mother        was a smoker   Kidney disease Mother    Obesity Mother    COPD Father        smoker   Cancer Father        colon, lung prostate   Alcohol abuse Father    Hypertension Father    Obesity Father    Asthma Sister    Alcohol abuse Brother    Diabetes Paternal Uncle    Breast cancer Neg Hx     ROS: Review of Systems  Constitutional: Negative for weight loss.  Cardiovascular: Negative for chest pain.  Gastrointestinal: Negative for diarrhea, nausea and vomiting.  Musculoskeletal:       Negative muscle weakness  Neurological: Negative for headaches.  Psychiatric/Behavioral: Positive for depression. Negative for suicidal ideas.    PHYSICAL EXAM: Pt in no acute distress  RECENT LABS AND TESTS: BMET    Component Value Date/Time   NA 142 06/05/2018 0902   K 4.3 06/05/2018 0902   CL 102 06/05/2018 0902   CO2 25 06/05/2018 0902   GLUCOSE 108 (H) 06/05/2018 0902   GLUCOSE 111 (H) 06/11/2017 0950   GLUCOSE 95 05/11/2006 1148   BUN 18 06/05/2018 0902   CREATININE 0.94 06/05/2018 0902   CREATININE 0.96 05/12/2016 1612   CALCIUM 9.4 06/05/2018 0902   GFRNONAA 67 06/05/2018 0902   GFRAA 77 06/05/2018 0902   Lab Results  Component Value Date   HGBA1C 5.4 06/05/2018   HGBA1C 5.3 09/10/2017   Lab Results  Component Value Date   INSULIN 37.8 (H) 06/05/2018   INSULIN 27.9 (H) 02/04/2018   INSULIN 39.9 (H) 09/10/2017   CBC    Component Value Date/Time   WBC 6.6 09/10/2017 0942   WBC 8.7 05/12/2016 1612   RBC 4.61  09/10/2017 0942   RBC 4.69 05/12/2016 1612   HGB 14.5 09/10/2017 0942   HCT 44.2 09/10/2017 0942   PLT  316 05/12/2016 1612   MCV 96 09/10/2017 0942   MCH 31.5 09/10/2017 0942   MCH 30.7 05/12/2016 1612   MCHC 32.8 09/10/2017 0942   MCHC 33.5 05/12/2016 1612   RDW 13.4 09/10/2017 0942   LYMPHSABS 1.7 09/10/2017 0942   MONOABS 696 05/12/2016 1612   EOSABS 0.2 09/10/2017 0942   BASOSABS 0.1 09/10/2017 0942   Iron/TIBC/Ferritin/ %Sat No results found for: IRON, TIBC, FERRITIN, IRONPCTSAT Lipid Panel     Component Value Date/Time   CHOL 146 06/05/2018 0902   TRIG 87 06/05/2018 0902   HDL 52 06/05/2018 0902   CHOLHDL 3 06/11/2017 0950   VLDL 14.8 06/11/2017 0950   LDLCALC 77 06/05/2018 0902   Hepatic Function Panel     Component Value Date/Time   PROT 6.8 06/05/2018 0902   ALBUMIN 4.4 06/05/2018 0902   AST 21 06/05/2018 0902   ALT 25 06/05/2018 0902   ALKPHOS 115 06/05/2018 0902   BILITOT 0.6 06/05/2018 0902   BILIDIR 0.1 06/12/2014 1359      Component Value Date/Time   TSH 2.180 09/10/2017 0942   TSH 2.62 05/12/2016 1612   TSH 1.87 06/12/2014 1359      I, Trixie Dredge, am acting as transcriptionist for Abby Potash, PA-C I, Abby Potash, PA-C have reviewed above note and agree with its content

## 2018-10-07 ENCOUNTER — Encounter (INDEPENDENT_AMBULATORY_CARE_PROVIDER_SITE_OTHER): Payer: Self-pay

## 2018-10-09 ENCOUNTER — Ambulatory Visit (INDEPENDENT_AMBULATORY_CARE_PROVIDER_SITE_OTHER): Payer: 59 | Admitting: Physician Assistant

## 2018-10-10 NOTE — Addendum Note (Signed)
Addended by: Abby Potash on: 10/10/2018 02:45 PM   Modules accepted: Level of Service

## 2018-10-17 ENCOUNTER — Encounter (INDEPENDENT_AMBULATORY_CARE_PROVIDER_SITE_OTHER): Payer: Self-pay | Admitting: Physician Assistant

## 2018-10-19 ENCOUNTER — Encounter (INDEPENDENT_AMBULATORY_CARE_PROVIDER_SITE_OTHER): Payer: Self-pay | Admitting: Physician Assistant

## 2018-10-21 ENCOUNTER — Other Ambulatory Visit (INDEPENDENT_AMBULATORY_CARE_PROVIDER_SITE_OTHER): Payer: Self-pay | Admitting: Physician Assistant

## 2018-10-21 ENCOUNTER — Ambulatory Visit (INDEPENDENT_AMBULATORY_CARE_PROVIDER_SITE_OTHER): Payer: 59 | Admitting: Physician Assistant

## 2018-10-21 DIAGNOSIS — E559 Vitamin D deficiency, unspecified: Secondary | ICD-10-CM

## 2018-10-22 ENCOUNTER — Encounter (INDEPENDENT_AMBULATORY_CARE_PROVIDER_SITE_OTHER): Payer: Self-pay | Admitting: Physician Assistant

## 2018-10-22 ENCOUNTER — Other Ambulatory Visit (INDEPENDENT_AMBULATORY_CARE_PROVIDER_SITE_OTHER): Payer: Self-pay | Admitting: Physician Assistant

## 2018-10-22 DIAGNOSIS — I10 Essential (primary) hypertension: Secondary | ICD-10-CM

## 2018-10-22 DIAGNOSIS — E8881 Metabolic syndrome: Secondary | ICD-10-CM

## 2018-10-22 NOTE — Telephone Encounter (Signed)
Please address and let her know to check with [facility] on [address]

## 2018-10-24 ENCOUNTER — Other Ambulatory Visit (INDEPENDENT_AMBULATORY_CARE_PROVIDER_SITE_OTHER): Payer: Self-pay | Admitting: Physician Assistant

## 2018-10-24 DIAGNOSIS — F3289 Other specified depressive episodes: Secondary | ICD-10-CM

## 2018-10-24 DIAGNOSIS — I10 Essential (primary) hypertension: Secondary | ICD-10-CM

## 2018-11-02 ENCOUNTER — Encounter (INDEPENDENT_AMBULATORY_CARE_PROVIDER_SITE_OTHER): Payer: Self-pay | Admitting: Physician Assistant

## 2018-11-07 ENCOUNTER — Other Ambulatory Visit (INDEPENDENT_AMBULATORY_CARE_PROVIDER_SITE_OTHER): Payer: Self-pay

## 2018-11-07 ENCOUNTER — Encounter (INDEPENDENT_AMBULATORY_CARE_PROVIDER_SITE_OTHER): Payer: Self-pay | Admitting: Physician Assistant

## 2018-11-07 DIAGNOSIS — I10 Essential (primary) hypertension: Secondary | ICD-10-CM

## 2018-11-07 DIAGNOSIS — F3289 Other specified depressive episodes: Secondary | ICD-10-CM

## 2018-11-07 MED ORDER — POTASSIUM CHLORIDE CRYS ER 20 MEQ PO TBCR: EXTENDED_RELEASE_TABLET | ORAL | 0 refills | Status: DC

## 2018-11-07 MED ORDER — HYDROCHLOROTHIAZIDE 25 MG PO TABS: 25.0000 mg | ORAL_TABLET | Freq: Every day | ORAL | 0 refills | Status: DC

## 2018-11-07 MED ORDER — BUPROPION HCL ER (SR) 150 MG PO TB12: 150.0000 mg | ORAL_TABLET | Freq: Every day | ORAL | 0 refills | Status: DC

## 2018-11-11 ENCOUNTER — Encounter (INDEPENDENT_AMBULATORY_CARE_PROVIDER_SITE_OTHER): Payer: Self-pay | Admitting: Physician Assistant

## 2018-11-11 ENCOUNTER — Ambulatory Visit (INDEPENDENT_AMBULATORY_CARE_PROVIDER_SITE_OTHER): Payer: 59 | Admitting: Physician Assistant

## 2018-11-11 ENCOUNTER — Other Ambulatory Visit: Payer: Self-pay

## 2018-11-11 DIAGNOSIS — Z6841 Body Mass Index (BMI) 40.0 and over, adult: Secondary | ICD-10-CM

## 2018-11-11 DIAGNOSIS — E559 Vitamin D deficiency, unspecified: Secondary | ICD-10-CM | POA: Diagnosis not present

## 2018-11-11 DIAGNOSIS — R7303 Prediabetes: Secondary | ICD-10-CM | POA: Diagnosis not present

## 2018-11-11 MED ORDER — VITAMIN D (ERGOCALCIFEROL) 1.25 MG (50000 UNIT) PO CAPS: 50000.0000 [IU] | ORAL_CAPSULE | ORAL | 0 refills | Status: DC

## 2018-11-11 MED ORDER — METFORMIN HCL 500 MG PO TABS: 500.0000 mg | ORAL_TABLET | Freq: Every day | ORAL | 0 refills | Status: DC

## 2018-11-11 NOTE — Progress Notes (Signed)
Office: [phone]  /  Fax: [phone] TeleHealth Visit:  Audrey Duncan has verbally consented to this TeleHealth visit today. The patient is located at home, the provider is located at the [employer] [facility] office. The participants in this visit include the listed provider and patient. The visit was conducted today via face time.  HPI:   Chief Complaint: OBESITY Audrey Duncan is here to discuss her progress with her obesity treatment plan. She is on the Category 2 plan and is following her eating plan approximately 40-50 % of the time. She states she is walking for 30 minutes 2 times per week. Audrey Duncan reports that she has been skipping meals due to working from home on some days. She wants to start walking more during the week.  We were unable to weigh the patient today for this TeleHealth visit. She feels as if she has lost weight since her last visit. She has lost 15 lbs since starting treatment with [place].  Vitamin D Deficiency Audrey Duncan has a diagnosis of vitamin D deficiency. She is currently taking prescription Vit D and denies nausea, vomiting or muscle weakness.  Pre-Diabetes Audrey Duncan has a diagnosis of pre-diabetes based on her elevated Hgb A1c and was informed this puts her at greater risk of developing diabetes. She is taking metformin currently and denies nausea, vomiting, or diarrhea. She continues to work on diet and exercise to decrease risk of diabetes. She denies polyphagia or hypoglycemia.  ASSESSMENT AND PLAN:  Vitamin D deficiency - Plan: Vitamin D, Ergocalciferol, (DRISDOL) 1.25 MG (50000 UT) CAPS capsule  Prediabetes - Plan: metFORMIN (GLUCOPHAGE) 500 MG tablet  Class 3 severe obesity with serious comorbidity and body mass index (BMI) of 45.0 to 49.9 in adult, unspecified obesity type ([place])  PLAN:  Vitamin D Deficiency Audrey Duncan was informed that low vitamin D levels contributes to fatigue and are associated with obesity, breast, and colon cancer.  Audrey Duncan agrees to continue taking prescription Vit D @50 ,000 IU every week #4 and we will refill for 1 month. She will follow up for routine testing of vitamin D, at least 2-3 times per year. She was informed of the risk of over-replacement of vitamin D and agrees to not increase her dose unless she discusses this with [place] first. Audrey Duncan agrees to follow up with our clinic in 2 weeks.  Pre-Diabetes Audrey Duncan will continue to work on weight loss, exercise, and decreasing simple carbohydrates in her diet to help decrease the risk of diabetes. We dicussed metformin including benefits and risks. She was informed that eating too many simple carbohydrates or too many calories at one sitting increases the likelihood of GI side effects. Audrey Duncan agrees to continue taking metformin 500 mg q AM #30 and we will refill for 1 month. Audrey Duncan agrees to follow up with our clinic in 2 weeks as directed to monitor her progress.  Obesity Audrey Duncan is currently in the action stage of change. As such, her goal is to continue with weight loss efforts She has agreed to follow the Category 2 plan Audrey Duncan has been instructed to work up to a goal of 150 minutes of combined cardio and strengthening exercise per week for weight loss and overall health benefits. We discussed the following Behavioral Modification Strategies today: work on meal planning and easy cooking plans and no skipping meals   Audrey Duncan has agreed to follow up with our clinic in 2 weeks. She was informed of the importance of frequent follow up visits to maximize her success with intensive  lifestyle modifications for her multiple health conditions.  ALLERGIES: No Known Allergies  MEDICATIONS: Current Outpatient Medications on File Prior to Visit  Medication Sig Dispense Refill  . buPROPion (WELLBUTRIN SR) 150 MG 12 hr tablet Take 1 tablet (150 mg total) by mouth daily. 30 tablet 0  . hydrochlorothiazide (HYDRODIURIL) 25 MG tablet Take 1 tablet (25 mg  total) by mouth daily. 30 tablet 0  . Magnesium 250 MG TABS Take 1 tablet by mouth daily.    . polyethylene glycol powder (GLYCOLAX/MIRALAX) powder Take 17 g by mouth daily. 3350 g 0  . potassium chloride SA (K-DUR) 20 MEQ tablet TAKE 1 TABLET(20 MEQ) BY MOUTH DAILY 30 tablet 0  . Probiotic Product (ALIGN PO) Take 1 capsule by mouth daily.     Current Facility-Administered Medications on File Prior to Visit  Medication Dose Route Frequency Provider Last Rate Last Dose  . 0.9 %  sodium chloride infusion  500 mL Intravenous Continuous Doran Stabler, MD        PAST MEDICAL HISTORY: Past Medical History:  Diagnosis Date  . Asthma   . Chiari malformation type II ([place])    diagnosed when in high school  . Chiari malformation type II ([place])   . Colon polyp 2006   (TUBULAR ADENOMA)--COLONOSCOPY-DR. HAYES  . Constipation   . Fluid retention   . H/O seasonal allergies   . Medial meniscus tear    left  . Over weight   . Ulcerative colitis ([place])     PAST SURGICAL HISTORY: Past Surgical History:  Procedure Laterality Date  . ABDOMINAL HYSTERECTOMY  1999   complete  . [place]  . HERNIA REPAIR  6812   umbilical    SOCIAL HISTORY: Social History   Tobacco Use  . Smoking status: Never Smoker  . Smokeless tobacco: Never Used  Substance Use Topics  . Alcohol use: Yes    Comment: 1 glass of wine 1-2 x a month  . Drug use: No    FAMILY HISTORY: Family History  Problem Relation Age of Onset  . Heart disease Mother 20       2 MIs   . Stroke Mother 53  . Hypertension Mother   . Cancer Mother        lung, lymphoma  . Lung cancer Mother        was a smoker  . Kidney disease Mother   . Obesity Mother   . COPD Father        smoker  . Cancer Father        colon, lung prostate  . Alcohol abuse Father   . Hypertension Father   . Obesity Father   . Asthma Sister   . Alcohol abuse Brother   . Diabetes Paternal Uncle   . Breast cancer Neg Hx      ROS: Review of Systems  Constitutional: Positive for weight loss.  Gastrointestinal: Negative for diarrhea, nausea and vomiting.  Musculoskeletal:       Negative muscle weakness  Endo/Heme/Allergies:       Negative polyphagia Negative hypoglycemia    PHYSICAL EXAM: Pt in no acute distress  RECENT LABS AND TESTS: BMET    Component Value Date/Time   NA 142 06/05/2018 0902   K 4.3 06/05/2018 0902   CL 102 06/05/2018 0902   CO2 25 06/05/2018 0902   GLUCOSE 108 (H) 06/05/2018 0902   GLUCOSE 111 (H) 06/11/2017 0950   GLUCOSE 95 05/11/2006 1148  BUN 18 06/05/2018 0902   CREATININE 0.94 06/05/2018 0902   CREATININE 0.96 05/12/2016 1612   CALCIUM 9.4 06/05/2018 0902   GFRNONAA 67 06/05/2018 0902   GFRAA 77 06/05/2018 0902   Lab Results  Component Value Date   HGBA1C 5.4 06/05/2018   HGBA1C 5.3 09/10/2017   Lab Results  Component Value Date   INSULIN 37.8 (H) 06/05/2018   INSULIN 27.9 (H) 02/04/2018   INSULIN 39.9 (H) 09/10/2017   CBC    Component Value Date/Time   WBC 6.6 09/10/2017 0942   WBC 8.7 05/12/2016 1612   RBC 4.61 09/10/2017 0942   RBC 4.69 05/12/2016 1612   HGB 14.5 09/10/2017 0942   HCT 44.2 09/10/2017 0942   PLT 316 05/12/2016 1612   MCV 96 09/10/2017 0942   MCH 31.5 09/10/2017 0942   MCH 30.7 05/12/2016 1612   MCHC 32.8 09/10/2017 0942   MCHC 33.5 05/12/2016 1612   RDW 13.4 09/10/2017 0942   LYMPHSABS 1.7 09/10/2017 0942   MONOABS 696 05/12/2016 1612   EOSABS 0.2 09/10/2017 0942   BASOSABS 0.1 09/10/2017 0942   Iron/TIBC/Ferritin/ %Sat No results found for: IRON, TIBC, FERRITIN, IRONPCTSAT Lipid Panel     Component Value Date/Time   CHOL 146 06/05/2018 0902   TRIG 87 06/05/2018 0902   HDL 52 06/05/2018 0902   CHOLHDL 3 06/11/2017 0950   VLDL 14.8 06/11/2017 0950   LDLCALC 77 06/05/2018 0902   Hepatic Function Panel     Component Value Date/Time   PROT 6.8 06/05/2018 0902   ALBUMIN 4.4 06/05/2018 0902   AST 21 06/05/2018 0902    ALT 25 06/05/2018 0902   ALKPHOS 115 06/05/2018 0902   BILITOT 0.6 06/05/2018 0902   BILIDIR 0.1 06/12/2014 1359      Component Value Date/Time   TSH 2.180 09/10/2017 0942   TSH 2.62 05/12/2016 1612   TSH 1.87 06/12/2014 1359      I, Trixie Dredge, am acting as transcriptionist for Abby Potash, PA-C I, Abby Potash, PA-C have reviewed above note and agree with its content

## 2018-11-25 ENCOUNTER — Encounter (INDEPENDENT_AMBULATORY_CARE_PROVIDER_SITE_OTHER): Payer: Self-pay | Admitting: Physician Assistant

## 2018-11-27 ENCOUNTER — Encounter (INDEPENDENT_AMBULATORY_CARE_PROVIDER_SITE_OTHER): Payer: Self-pay | Admitting: Physician Assistant

## 2018-11-27 ENCOUNTER — Other Ambulatory Visit (INDEPENDENT_AMBULATORY_CARE_PROVIDER_SITE_OTHER): Payer: Self-pay | Admitting: Physician Assistant

## 2018-11-27 ENCOUNTER — Ambulatory Visit (INDEPENDENT_AMBULATORY_CARE_PROVIDER_SITE_OTHER): Payer: 59 | Admitting: Physician Assistant

## 2018-11-27 ENCOUNTER — Other Ambulatory Visit: Payer: Self-pay

## 2018-11-27 DIAGNOSIS — I10 Essential (primary) hypertension: Secondary | ICD-10-CM

## 2018-11-27 DIAGNOSIS — R7303 Prediabetes: Secondary | ICD-10-CM

## 2018-11-27 DIAGNOSIS — R69 Illness, unspecified: Secondary | ICD-10-CM | POA: Diagnosis not present

## 2018-11-27 DIAGNOSIS — F3289 Other specified depressive episodes: Secondary | ICD-10-CM

## 2018-11-27 DIAGNOSIS — Z6841 Body Mass Index (BMI) 40.0 and over, adult: Secondary | ICD-10-CM

## 2018-11-27 MED ORDER — HYDROCHLOROTHIAZIDE 25 MG PO TABS: 25.0000 mg | ORAL_TABLET | Freq: Every day | ORAL | 0 refills | Status: DC

## 2018-11-27 MED ORDER — BUPROPION HCL ER (SR) 150 MG PO TB12: 150.0000 mg | ORAL_TABLET | Freq: Every day | ORAL | 1 refills | Status: DC

## 2018-11-27 MED ORDER — METFORMIN HCL 500 MG PO TABS: 500.0000 mg | ORAL_TABLET | Freq: Every day | ORAL | 1 refills | Status: DC

## 2018-11-27 MED ORDER — POTASSIUM CHLORIDE CRYS ER 20 MEQ PO TBCR: EXTENDED_RELEASE_TABLET | ORAL | 0 refills | Status: DC

## 2018-11-27 NOTE — Progress Notes (Signed)
Office: [phone]  /  Fax: [phone] TeleHealth Visit:  [name] has verbally consented to this TeleHealth visit today. The patient is located at work, the provider is located at the [employer] [facility] office. The participants in this visit include the listed provider and patient. The visit was conducted today via FaceTime.  HPI:   Chief Complaint: OBESITY Audrey Duncan is here to discuss her progress with her obesity treatment plan. She is on the Category 2 plan and is following her eating plan approximately 80% of the time. She states she is walking 30 minutes 4 times per week. Greenlee reports that she is not weighing herself at home. She believes that she has maintained or maybe lost a pound as her pants are a little looser. She sometimes forgets to eat lunch while at work.  We were unable to weigh the patient today for this TeleHealth visit. She feels as if she has maintained her weight since her last visit. She has lost 15 lbs since starting treatment with [place].  Pre-Diabetes Audrey Duncan has a diagnosis of prediabetes based on her elevated Hgb A1c and was informed this puts her at greater risk of developing diabetes. She is taking metformin currently and continues to work on diet and exercise to decrease risk of diabetes. She denies nausea, vomiting, or diarrhea on metformin. No polyphagia.  Hypertension Audrey Duncan is a 58 y.o. female with hypertension and is on HCTZ and potassium. Audrey Duncan denies chest pain or headache. She is working weight loss to help control her blood pressure with the goal of decreasing her risk of heart attack and stroke. She reports her blood pressure was 120/76 on Saturday (4 days ago).  Depression with emotional eating behaviors [name] is struggling with emotional eating and using food for comfort to the extent that it is negatively impacting her health. She often snacks when she is not hungry. Audrey Duncan sometimes feels she is  out of control and then feels guilty that she made poor food choices. She has been working on behavior modification techniques to help reduce her emotional eating and has been somewhat successful. She shows no sign of suicidal or homicidal ideations. No cravings.  Depression screen [name] 2/9 09/10/2017 06/11/2017 05/12/2016 01/06/2013  Decreased Interest 2 0 0 0  Down, Depressed, Hopeless 1 0 0 0  PHQ - 2 Score 3 0 0 0  Altered sleeping 2 - - -  Tired, decreased energy 3 - - -  Change in appetite 1 - - -  Feeling bad or failure about yourself  2 - - -  Trouble concentrating 0 - - -  Moving slowly or fidgety/restless 0 - - -  Suicidal thoughts 0 - - -  PHQ-9 Score 11 - - -  Difficult doing work/chores Not difficult at all - - -   ASSESSMENT AND PLAN:  Prediabetes - Plan: Comprehensive metabolic panel, Hemoglobin A1c, Insulin, random, VITAMIN D 25 Hydroxy (Vit-D Deficiency, Fractures), metFORMIN (GLUCOPHAGE) 500 MG tablet  Essential hypertension - Plan: hydrochlorothiazide (HYDRODIURIL) 25 MG tablet, potassium chloride SA (K-DUR) 20 MEQ tablet  Other depression - with emotional eating - Plan: buPROPion (WELLBUTRIN SR) 150 MG 12 hr tablet  Class 3 severe obesity with serious comorbidity and body mass index (BMI) of 45.0 to 49.9 in adult, unspecified obesity type ([place])  PLAN:  Pre-Diabetes Audrey Duncan will continue to work on weight loss, exercise, and decreasing simple carbohydrates in her diet to help decrease the risk of diabetes. We dicussed  metformin including benefits and risks. She was informed that eating too many simple carbohydrates or too many calories at one sitting increases the likelihood of GI side effects. Audrey Duncan was given a refill on her metformin #30 with 1 refill and agrees to follow-up with our clinic in 2 weeks.  Hypertension We discussed sodium restriction, working on healthy weight loss, and a regular exercise program as the means to achieve improved blood pressure control.  Audrey Duncan agreed with this plan and agreed to follow up as directed. We will continue to monitor her blood pressure as well as her progress with the above lifestyle modifications. Audrey Duncan was given a refill on her HCTZ #30 with 0 refills and a refill on her KCl #30 with 0 refills and she agrees to follow-up with our clinic in 2 weeks. She will watch for signs of hypotension as she continues her lifestyle modifications.  Depression with Emotional Eating Behaviors We discussed behavior modification techniques today to help Audrey Duncan deal with her emotional eating and depression. Audrey Duncan was given a refill on her bupropion #30 with 1 refill and agrees to follow-up with our clinic in 2 weeks.  Obesity Audrey is currently in the action stage of change. As such, her goal is to continue with weight loss efforts. She has agreed to follow the Category 2 plan. [name] has been instructed to work up to a goal of 150 minutes of combined cardio and strengthening exercise per week for weight loss and overall health benefits. We discussed the following Behavioral Modification Strategies today: work on meal planning and easy cooking plans.  Audrey Duncan has agreed to follow-up with our clinic in 2 weeks. She was informed of the importance of frequent follow-up visits to maximize her success with intensive lifestyle modifications for her multiple health conditions.  ALLERGIES: No Known Allergies  MEDICATIONS: Current Outpatient Medications on File Prior to Visit  Medication Sig Dispense Refill  . Magnesium 250 MG TABS Take 1 tablet by mouth daily.    . polyethylene glycol powder (GLYCOLAX/MIRALAX) powder Take 17 g by mouth daily. 3350 g 0  . Probiotic Product (ALIGN PO) Take 1 capsule by mouth daily.    . Vitamin D, Ergocalciferol, (DRISDOL) 1.25 MG (50000 UT) CAPS capsule Take 1 capsule (50,000 Units total) by mouth every 7 (seven) days. 4 capsule 0   Current Facility-Administered Medications on File Prior  to Visit  Medication Dose Route Frequency Provider Last Rate Last Dose  . 0.9 %  sodium chloride infusion  500 mL Intravenous Continuous Doran Stabler, MD        PAST MEDICAL HISTORY: Past Medical History:  Diagnosis Date  . Asthma   . Chiari malformation type II ([place])    diagnosed when in high school  . Chiari malformation type II ([place])   . Colon polyp 2006   (TUBULAR ADENOMA)--COLONOSCOPY-DR. HAYES  . Constipation   . Fluid retention   . H/O seasonal allergies   . Medial meniscus tear    left  . Over weight   . Ulcerative colitis ([place])     PAST SURGICAL HISTORY: Past Surgical History:  Procedure Laterality Date  . ABDOMINAL HYSTERECTOMY  1999   complete  . [place]  . HERNIA REPAIR  [id number]   umbilical    SOCIAL HISTORY: Social History   Tobacco Use  . Smoking status: Never Smoker  . Smokeless tobacco: Never Used  Substance Use Topics  . Alcohol use: Yes    Comment: 1  glass of wine 1-2 x a month  . Drug use: No    FAMILY HISTORY: Family History  Problem Relation Age of Onset  . Heart disease Mother 81       2 MIs   . Stroke Mother 16  . Hypertension Mother   . Cancer Mother        lung, lymphoma  . Lung cancer Mother        was a smoker  . Kidney disease Mother   . Obesity Mother   . COPD Father        smoker  . Cancer Father        colon, lung prostate  . Alcohol abuse Father   . Hypertension Father   . Obesity Father   . Asthma Sister   . Alcohol abuse Brother   . Diabetes Paternal Uncle   . Breast cancer Neg Hx    ROS: Review of Systems  Cardiovascular: Negative for chest pain.  Gastrointestinal: Negative for diarrhea, nausea and vomiting.  Neurological: Negative for headaches.  Endo/Heme/Allergies:       Negative for polyphagia.  Psychiatric/Behavioral: Positive for depression (emotional eating). Negative for suicidal ideas.       Negative for homicidal ideas.   PHYSICAL EXAM: Pt in no acute distress   RECENT LABS AND TESTS: BMET    Component Value Date/Time   NA 142 06/05/2018 0902   K 4.3 06/05/2018 0902   CL 102 06/05/2018 0902   CO2 25 06/05/2018 0902   GLUCOSE 108 (H) 06/05/2018 0902   GLUCOSE 111 (H) 06/11/2017 0950   GLUCOSE 95 05/11/2006 1148   BUN 18 06/05/2018 0902   CREATININE 0.94 06/05/2018 0902   CREATININE 0.96 05/12/2016 1612   CALCIUM 9.4 06/05/2018 0902   GFRNONAA 67 06/05/2018 0902   GFRAA 77 06/05/2018 0902   Lab Results  Component Value Date   HGBA1C 5.4 06/05/2018   HGBA1C 5.3 09/10/2017   Lab Results  Component Value Date   INSULIN 37.8 (H) 06/05/2018   INSULIN 27.9 (H) 02/04/2018   INSULIN 39.9 (H) 09/10/2017   CBC    Component Value Date/Time   WBC 6.6 09/10/2017 0942   WBC 8.7 05/12/2016 1612   RBC 4.61 09/10/2017 0942   RBC 4.69 05/12/2016 1612   HGB 14.5 09/10/2017 0942   HCT 44.2 09/10/2017 0942   PLT 316 05/12/2016 1612   MCV 96 09/10/2017 0942   MCH 31.5 09/10/2017 0942   MCH 30.7 05/12/2016 1612   MCHC 32.8 09/10/2017 0942   MCHC 33.5 05/12/2016 1612   RDW 13.4 09/10/2017 0942   LYMPHSABS 1.7 09/10/2017 0942   MONOABS 696 05/12/2016 1612   EOSABS 0.2 09/10/2017 0942   BASOSABS 0.1 09/10/2017 0942   Iron/TIBC/Ferritin/ %Sat No results found for: IRON, TIBC, FERRITIN, IRONPCTSAT Lipid Panel     Component Value Date/Time   CHOL 146 06/05/2018 0902   TRIG 87 06/05/2018 0902   HDL 52 06/05/2018 0902   CHOLHDL 3 06/11/2017 0950   VLDL 14.8 06/11/2017 0950   LDLCALC 77 06/05/2018 0902   Hepatic Function Panel     Component Value Date/Time   PROT 6.8 06/05/2018 0902   ALBUMIN 4.4 06/05/2018 0902   AST 21 06/05/2018 0902   ALT 25 06/05/2018 0902   ALKPHOS 115 06/05/2018 0902   BILITOT 0.6 06/05/2018 0902   BILIDIR 0.1 06/12/2014 1359      Component Value Date/Time   TSH 2.180 09/10/2017 0942   TSH 2.62 05/12/2016 1612  TSH 1.87 06/12/2014 1359   Results for SUNJAI, LEVANDOSKI (MRN [id number]) as of 11/27/2018  13:41  Ref. Range 06/05/2018 09:02  Vitamin D, 25-Hydroxy Latest Ref Range: 30.0 - 100.0 ng/mL 40.4    I, Michaelene Song, am acting as Location manager for [employer], PA-C I, Abby Potash, PA-C have reviewed above note and agree with its content

## 2018-11-28 NOTE — Telephone Encounter (Signed)
Yes

## 2018-12-17 ENCOUNTER — Encounter (INDEPENDENT_AMBULATORY_CARE_PROVIDER_SITE_OTHER): Payer: Self-pay

## 2018-12-18 ENCOUNTER — Other Ambulatory Visit: Payer: Self-pay

## 2018-12-18 ENCOUNTER — Ambulatory Visit (INDEPENDENT_AMBULATORY_CARE_PROVIDER_SITE_OTHER): Payer: 59 | Admitting: Physician Assistant

## 2018-12-18 ENCOUNTER — Encounter (INDEPENDENT_AMBULATORY_CARE_PROVIDER_SITE_OTHER): Payer: Self-pay | Admitting: Physician Assistant

## 2018-12-18 DIAGNOSIS — E8881 Metabolic syndrome: Secondary | ICD-10-CM

## 2018-12-18 DIAGNOSIS — Z6841 Body Mass Index (BMI) 40.0 and over, adult: Secondary | ICD-10-CM | POA: Diagnosis not present

## 2018-12-18 DIAGNOSIS — I10 Essential (primary) hypertension: Secondary | ICD-10-CM

## 2018-12-19 NOTE — Progress Notes (Signed)
Office: [phone]  /  Fax: [phone] TeleHealth Visit:  Audrey Duncan has verbally consented to this TeleHealth visit today. The patient is located at work, the provider is located at the [employer] [facility] office. The participants in this visit include the listed provider and patient. The visit was conducted today via Face Time.  HPI:   Chief Complaint: OBESITY Audrey Duncan is here to discuss her progress with her obesity treatment plan. She is on the Category 2 plan and is following her eating plan approximately 85 % of the time. She states she is exercising 0 minutes 0 times per week. Audrey Duncan reports that she is not weighing herself at home. She is skipping lunch at work due to being extremely busy. [place].  We were unable to weigh the patient today for this TeleHealth visit. She feels unsure if she has lost weight since her last visit. She has lost 15 lbs since starting treatment with [place].  Insulin Resistance Audrey Duncan has a diagnosis of insulin resistance based on her elevated fasting insulin level >5. Although Audrey Duncan's blood glucose readings are still under good control, insulin resistance puts her at greater risk of metabolic syndrome and diabetes. She is taking metformin currently and continues to work on diet and exercise to decrease risk of diabetes. Jalaya denies polyphagia, nausea, vomiting, or diarrhea.  Hypertension Audrey Duncan is a 58 y.o. female with hypertension. Audrey Duncan is on HCTZ and potassium. She is working on weight loss to help control her blood pressure with the goal of decreasing her risk of heart attack and stroke. Audrey Duncan denies chest pain or headaches.  ASSESSMENT AND PLAN:  Insulin resistance  Essential hypertension  Class 3 severe obesity with serious comorbidity and body mass index (BMI) of 45.0 to 49.9 in adult, unspecified obesity type ([place])  PLAN:  Insulin Resistance Audrey Duncan will  continue to work on weight loss, exercise, and decreasing simple carbohydrates in her diet to help decrease the risk of diabetes. She was informed that eating too many simple carbohydrates or too many calories at one sitting increases the likelihood of GI side effects. Krizia agreed to continue her medications and weight loss. We will check labs and an indirect calorimetry at her next visit. Audrey Duncan agreed to follow up with [place] as directed to monitor her progress in 2 weeks.   Hypertension We discussed sodium restriction, working on healthy weight loss, and a regular exercise program as the means to achieve improved blood pressure control. We will continue to monitor her blood pressure as well as her progress with the above lifestyle modifications. She will continue her HCTZ 25 mg qd #30 with no refills and potassium chloride 20 mEq qd #30 with no refills. She will watch for signs of hypotension as she continues her lifestyle modifications. Audrey Duncan agreed with this plan and agreed to follow up as directed.  Time spent on office visit was 21 minutes.  Obesity Audrey Duncan is currently in the action stage of change. As such, her goal is to continue with weight loss efforts. She has agreed to keep a food journal with 1200 calories and 85 grams of protein daily. Audrey Duncan has been instructed to work up to a goal of 150 minutes of combined cardio and strengthening exercise per week for weight loss and overall health benefits. We discussed the following Behavioral Modification Strategies today: travel eating strategies and no skipping meals.  We will check her indirect calorimetry at her next visit.  Audrey Duncan has agreed to follow up with our clinic in 2 weeks. She was informed of the importance of frequent follow up visits to maximize her success with intensive lifestyle modifications for her multiple health conditions.  ALLERGIES: No Known Allergies  MEDICATIONS: Current Outpatient Medications on File  Prior to Visit  Medication Sig Dispense Refill  . buPROPion (WELLBUTRIN SR) 150 MG 12 hr tablet Take 1 tablet (150 mg total) by mouth daily. 30 tablet 1  . hydrochlorothiazide (HYDRODIURIL) 25 MG tablet Take 1 tablet (25 mg total) by mouth daily. 30 tablet 0  . Magnesium 250 MG TABS Take 1 tablet by mouth daily.    . metFORMIN (GLUCOPHAGE) 500 MG tablet Take 1 tablet (500 mg total) by mouth daily with breakfast. 30 tablet 1  . polyethylene glycol powder (GLYCOLAX/MIRALAX) powder Take 17 g by mouth daily. 3350 g 0  . potassium chloride SA (K-DUR) 20 MEQ tablet TAKE 1 TABLET(20 MEQ) BY MOUTH DAILY 30 tablet 0  . Probiotic Product (ALIGN PO) Take 1 capsule by mouth daily.    . Vitamin D, Ergocalciferol, (DRISDOL) 1.25 MG (50000 UT) CAPS capsule Take 1 capsule (50,000 Units total) by mouth every 7 (seven) days. 4 capsule 0   Current Facility-Administered Medications on File Prior to Visit  Medication Dose Route Frequency Provider Last Rate Last Dose  . 0.9 %  sodium chloride infusion  500 mL Intravenous Continuous Doran Stabler, MD        PAST MEDICAL HISTORY: Past Medical History:  Diagnosis Date  . Asthma   . Chiari malformation type II ([place])    diagnosed when in high school  . Chiari malformation type II ([place])   . Colon polyp 2006   (TUBULAR ADENOMA)--COLONOSCOPY-DR. HAYES  . Constipation   . Fluid retention   . H/O seasonal allergies   . Medial meniscus tear    left  . Over weight   . Ulcerative colitis ([place])     PAST SURGICAL HISTORY: Past Surgical History:  Procedure Laterality Date  . ABDOMINAL HYSTERECTOMY  1999   complete  . [place]  . HERNIA REPAIR  [id number]   umbilical    SOCIAL HISTORY: Social History   Tobacco Use  . Smoking status: Never Smoker  . Smokeless tobacco: Never Used  Substance Use Topics  . Alcohol use: Yes    Comment: 1 glass of wine 1-2 x a month  . Drug use: No    FAMILY HISTORY: Family History  Problem  Relation Age of Onset  . Heart disease Mother 3       2 MIs   . Stroke Mother 24  . Hypertension Mother   . Cancer Mother        lung, lymphoma  . Lung cancer Mother        was a smoker  . Kidney disease Mother   . Obesity Mother   . COPD Father        smoker  . Cancer Father        colon, lung prostate  . Alcohol abuse Father   . Hypertension Father   . Obesity Father   . Asthma Sister   . Alcohol abuse Brother   . Diabetes Paternal Uncle   . Breast cancer Neg Hx     ROS: Review of Systems  Gastrointestinal: Negative for diarrhea, nausea and vomiting.  Endo/Heme/Allergies:       Negative for polyphagia.    PHYSICAL EXAM: Pt in  no acute distress  RECENT LABS AND TESTS: BMET    Component Value Date/Time   NA 142 06/05/2018 0902   K 4.3 06/05/2018 0902   CL 102 06/05/2018 0902   CO2 25 06/05/2018 0902   GLUCOSE 108 (H) 06/05/2018 0902   GLUCOSE 111 (H) 06/11/2017 0950   GLUCOSE 95 05/11/2006 1148   BUN 18 06/05/2018 0902   CREATININE 0.94 06/05/2018 0902   CREATININE 0.96 05/12/2016 1612   CALCIUM 9.4 06/05/2018 0902   GFRNONAA 67 06/05/2018 0902   GFRAA 77 06/05/2018 0902   Lab Results  Component Value Date   HGBA1C 5.4 06/05/2018   HGBA1C 5.3 09/10/2017   Lab Results  Component Value Date   INSULIN 37.8 (H) 06/05/2018   INSULIN 27.9 (H) 02/04/2018   INSULIN 39.9 (H) 09/10/2017   CBC    Component Value Date/Time   WBC 6.6 09/10/2017 0942   WBC 8.7 05/12/2016 1612   RBC 4.61 09/10/2017 0942   RBC 4.69 05/12/2016 1612   HGB 14.5 09/10/2017 0942   HCT 44.2 09/10/2017 0942   PLT 316 05/12/2016 1612   MCV 96 09/10/2017 0942   MCH 31.5 09/10/2017 0942   MCH 30.7 05/12/2016 1612   MCHC 32.8 09/10/2017 0942   MCHC 33.5 05/12/2016 1612   RDW 13.4 09/10/2017 0942   LYMPHSABS 1.7 09/10/2017 0942   MONOABS 696 05/12/2016 1612   EOSABS 0.2 09/10/2017 0942   BASOSABS 0.1 09/10/2017 0942   Iron/TIBC/Ferritin/ %Sat No results found for: IRON, TIBC,  FERRITIN, IRONPCTSAT Lipid Panel     Component Value Date/Time   CHOL 146 06/05/2018 0902   TRIG 87 06/05/2018 0902   HDL 52 06/05/2018 0902   CHOLHDL 3 06/11/2017 0950   VLDL 14.8 06/11/2017 0950   LDLCALC 77 06/05/2018 0902   Hepatic Function Panel     Component Value Date/Time   PROT 6.8 06/05/2018 0902   ALBUMIN 4.4 06/05/2018 0902   AST 21 06/05/2018 0902   ALT 25 06/05/2018 0902   ALKPHOS 115 06/05/2018 0902   BILITOT 0.6 06/05/2018 0902   BILIDIR 0.1 06/12/2014 1359      Component Value Date/Time   TSH 2.180 09/10/2017 0942   TSH 2.62 05/12/2016 1612   TSH 1.87 06/12/2014 1359   Results for Rochin, Arbor R (MRN [id number]) as of 12/19/2018 12:19  Ref. Range 06/05/2018 09:02  Vitamin D, 25-Hydroxy Latest Ref Range: 30.0 - 100.0 ng/mL 40.4     I, Marcille Blanco, CMA, am acting as transcriptionist for Abby Potash, PA-C I, Abby Potash, PA-C have reviewed above note and agree with its conten

## 2018-12-22 ENCOUNTER — Encounter (INDEPENDENT_AMBULATORY_CARE_PROVIDER_SITE_OTHER): Payer: Self-pay | Admitting: Physician Assistant

## 2018-12-23 MED ORDER — POTASSIUM CHLORIDE CRYS ER 20 MEQ PO TBCR: EXTENDED_RELEASE_TABLET | ORAL | 0 refills | Status: DC

## 2018-12-23 MED ORDER — HYDROCHLOROTHIAZIDE 25 MG PO TABS: 25.0000 mg | ORAL_TABLET | Freq: Every day | ORAL | 0 refills | Status: DC

## 2018-12-23 NOTE — Telephone Encounter (Signed)
Yes.. do I need to do anything with that or do you take care of that? THanks

## 2018-12-23 NOTE — Telephone Encounter (Signed)
Can these refills be added to encounter on 6/10 before you sign?

## 2018-12-23 NOTE — Telephone Encounter (Signed)
I can put it in the encounter but there is not an appropriate diagnosis?

## 2018-12-23 NOTE — Telephone Encounter (Signed)
Sorry, yes... hypertension.

## 2018-12-23 NOTE — Telephone Encounter (Signed)
Added for you to sign it's green dotted

## 2018-12-23 NOTE — Telephone Encounter (Signed)
Taken care of. Thanks!

## 2018-12-24 ENCOUNTER — Encounter (INDEPENDENT_AMBULATORY_CARE_PROVIDER_SITE_OTHER): Payer: Self-pay | Admitting: Physician Assistant

## 2019-01-09 ENCOUNTER — Ambulatory Visit (INDEPENDENT_AMBULATORY_CARE_PROVIDER_SITE_OTHER): Payer: 59 | Admitting: Physician Assistant

## 2019-01-09 ENCOUNTER — Other Ambulatory Visit: Payer: Self-pay

## 2019-01-09 ENCOUNTER — Encounter (INDEPENDENT_AMBULATORY_CARE_PROVIDER_SITE_OTHER): Payer: Self-pay | Admitting: Physician Assistant

## 2019-01-09 DIAGNOSIS — Z6841 Body Mass Index (BMI) 40.0 and over, adult: Secondary | ICD-10-CM | POA: Diagnosis not present

## 2019-01-09 DIAGNOSIS — E8881 Metabolic syndrome: Secondary | ICD-10-CM | POA: Diagnosis not present

## 2019-01-14 NOTE — Progress Notes (Signed)
Office: [phone]  /  Fax: [phone] TeleHealth Visit:  Audrey Duncan has verbally consented to this TeleHealth visit today. The patient is located at work, the provider is located at the [employer] [facility] office. The participants in this visit include the listed provider and patient and any and all parties involved. The visit was conducted today via telephone. Audrey Duncan was unable to use realtime audiovisual technology today and the telehealth visit was conducted via telephone.  HPI:   Chief Complaint: OBESITY Audrey Duncan is here to discuss her progress with her obesity treatment plan. She is on the Category 2 plan and is following her eating plan approximately 80 % of the time. She states she is walking up and down stairs for exercise. Audrey Duncan reports that she just returned from the beach, where she did not eat completely on the plan, but overall she did well. We were unable to weigh the patient today for this TeleHealth visit. She feels as if she has maintained weight since her last visit. She has lost 15 lbs since starting treatment with [place].  Insulin Resistance Audrey Duncan has a diagnosis of insulin resistance based on her elevated fasting insulin level >5. Although Audrey Duncan's blood glucose readings are still under good control, insulin resistance puts her at greater risk of metabolic syndrome and diabetes. She is taking metformin currently and continues to work on diet and exercise to decrease risk of diabetes. Audrey Duncan denies nausea, vomiting, diarrhea or polyphagia.  ASSESSMENT AND PLAN:  Insulin resistance  Class 3 severe obesity with serious comorbidity and body mass index (BMI) of 45.0 to 49.9 in adult, unspecified obesity type ([place])  PLAN:  Insulin Resistance Audrey Duncan will continue to work on weight loss, exercise, and decreasing simple carbohydrates in her diet to help decrease the risk of diabetes. We dicussed metformin including benefits and risks. She was  informed that eating too many simple carbohydrates or too many calories at one sitting increases the likelihood of GI side effects. Audrey Duncan will continue with metformin for now and prescription was not written today. Audrey Duncan agreed to follow up with [place] as directed to monitor her progress.  Obesity Audrey Duncan is currently in the action stage of change. As such, her goal is to continue with weight loss efforts She has agreed to follow the Category 2 plan Audrey Duncan has been instructed to work up to a goal of 150 minutes of combined cardio and strengthening exercise per week for weight loss and overall health benefits. We discussed the following Behavioral Modification Strategies today: keeping healthy foods in the home, work on meal planning and easy cooking plans  Audrey Duncan has agreed to follow up with our clinic in 4 weeks. She was informed of the importance of frequent follow up visits to maximize her success with intensive lifestyle modifications for her multiple health conditions.  ALLERGIES: No Known Allergies  MEDICATIONS: Current Outpatient Medications on File Prior to Visit  Medication Sig Dispense Refill   buPROPion (WELLBUTRIN SR) 150 MG 12 hr tablet Take 1 tablet (150 mg total) by mouth daily. 30 tablet 1   hydrochlorothiazide (HYDRODIURIL) 25 MG tablet Take 1 tablet (25 mg total) by mouth daily. 30 tablet 0   Magnesium 250 MG TABS Take 1 tablet by mouth daily.     metFORMIN (GLUCOPHAGE) 500 MG tablet Take 1 tablet (500 mg total) by mouth daily with breakfast. 30 tablet 1   polyethylene glycol powder (GLYCOLAX/MIRALAX) powder Take 17 g by mouth daily. 3350 g 0   potassium chloride  SA (K-DUR) 20 MEQ tablet TAKE 1 TABLET(20 MEQ) BY MOUTH DAILY 30 tablet 0   Probiotic Product (ALIGN PO) Take 1 capsule by mouth daily.     Vitamin D, Ergocalciferol, (DRISDOL) 1.25 MG (50000 UT) CAPS capsule Take 1 capsule (50,000 Units total) by mouth every 7 (seven) days. 4 capsule 0   Current  Facility-Administered Medications on File Prior to Visit  Medication Dose Route Frequency Provider Last Rate Last Dose   0.9 %  sodium chloride infusion  500 mL Intravenous Continuous Danis, Kirke Corin, MD        PAST MEDICAL HISTORY: Past Medical History:  Diagnosis Date   Asthma    Chiari malformation type II ([place])    diagnosed when in high school   Chiari malformation type II ([place])    Colon polyp 2006   (TUBULAR ADENOMA)--COLONOSCOPY-DR. HAYES   Constipation    Fluid retention    H/O seasonal allergies    Medial meniscus tear    left   Over weight    Ulcerative colitis ([place])     PAST SURGICAL HISTORY: Past Surgical History:  Procedure Laterality Date   ABDOMINAL HYSTERECTOMY  1999   complete   CESAREAN SECTION  1995 and 1998   HERNIA REPAIR  5277   umbilical    SOCIAL HISTORY: Social History   Tobacco Use   Smoking status: Never Smoker   Smokeless tobacco: Never Used  Substance Use Topics   Alcohol use: Yes    Comment: 1 glass of wine 1-2 x a month   Drug use: No    FAMILY HISTORY: Family History  Problem Relation Age of Onset   Heart disease Mother 48       2 MIs    Stroke Mother 26   Hypertension Mother    Cancer Mother        lung, lymphoma   Lung cancer Mother        was a smoker   Kidney disease Mother    Obesity Mother    COPD Father        smoker   Cancer Father        colon, lung prostate   Alcohol abuse Father    Hypertension Father    Obesity Father    Asthma Sister    Alcohol abuse Brother    Diabetes Paternal Uncle    Breast cancer Neg Hx     ROS: Review of Systems  Constitutional: Negative for weight loss.  Gastrointestinal: Negative for diarrhea, nausea and vomiting.  Endo/Heme/Allergies:       Negative for polyphagia    PHYSICAL EXAM: Pt in no acute distress  RECENT LABS AND TESTS: BMET    Component Value Date/Time   NA 142 06/05/2018 0902   K 4.3 06/05/2018 0902   CL 102  06/05/2018 0902   CO2 25 06/05/2018 0902   GLUCOSE 108 (H) 06/05/2018 0902   GLUCOSE 111 (H) 06/11/2017 0950   GLUCOSE 95 05/11/2006 1148   BUN 18 06/05/2018 0902   CREATININE 0.94 06/05/2018 0902   CREATININE 0.96 05/12/2016 1612   CALCIUM 9.4 06/05/2018 0902   GFRNONAA 67 06/05/2018 0902   GFRAA 77 06/05/2018 0902   Lab Results  Component Value Date   HGBA1C 5.4 06/05/2018   HGBA1C 5.3 09/10/2017   Lab Results  Component Value Date   INSULIN 37.8 (H) 06/05/2018   INSULIN 27.9 (H) 02/04/2018   INSULIN 39.9 (H) 09/10/2017   CBC    Component  Value Date/Time   WBC 6.6 09/10/2017 0942   WBC 8.7 05/12/2016 1612   RBC 4.61 09/10/2017 0942   RBC 4.69 05/12/2016 1612   HGB 14.5 09/10/2017 0942   HCT 44.2 09/10/2017 0942   PLT 316 05/12/2016 1612   MCV 96 09/10/2017 0942   MCH 31.5 09/10/2017 0942   MCH 30.7 05/12/2016 1612   MCHC 32.8 09/10/2017 0942   MCHC 33.5 05/12/2016 1612   RDW 13.4 09/10/2017 0942   LYMPHSABS 1.7 09/10/2017 0942   MONOABS 696 05/12/2016 1612   EOSABS 0.2 09/10/2017 0942   BASOSABS 0.1 09/10/2017 0942   Iron/TIBC/Ferritin/ %Sat No results found for: IRON, TIBC, FERRITIN, IRONPCTSAT Lipid Panel     Component Value Date/Time   CHOL 146 06/05/2018 0902   TRIG 87 06/05/2018 0902   HDL 52 06/05/2018 0902   CHOLHDL 3 06/11/2017 0950   VLDL 14.8 06/11/2017 0950   LDLCALC 77 06/05/2018 0902   Hepatic Function Panel     Component Value Date/Time   PROT 6.8 06/05/2018 0902   ALBUMIN 4.4 06/05/2018 0902   AST 21 06/05/2018 0902   ALT 25 06/05/2018 0902   ALKPHOS 115 06/05/2018 0902   BILITOT 0.6 06/05/2018 0902   BILIDIR 0.1 06/12/2014 1359      Component Value Date/Time   TSH 2.180 09/10/2017 0942   TSH 2.62 05/12/2016 1612   TSH 1.87 06/12/2014 1359     Ref. Range 06/05/2018 09:02  Vitamin D, 25-Hydroxy Latest Ref Range: 30.0 - 100.0 ng/mL 40.4   I, Doreene Nest, am acting as Location manager for Abby Potash, PA-C I, Abby Potash, PA-C have reviewed above note and agree with its content

## 2019-01-15 ENCOUNTER — Other Ambulatory Visit: Payer: Self-pay

## 2019-01-15 NOTE — Progress Notes (Signed)
[place] at [facility] [address], [place], [place] [address] [phone]  Date:  01/16/2019   Name:  Audrey Duncan   DOB:  09/23/1960   MRN:  [id number]  PCP:  [name], DO    Chief Complaint: Knot On Abdomen (under belly buttong, constipation, noticed for a while, increased in size, no pain, bloating)   History of Present Illness:  Audrey Duncan is a 58 y.o. very pleasant female patient who presents with the following:  Patient Dr. Etter Sjogren, here today for concern of a hard spot in her abdomen which she has noticed for the last 2 or 3 days.  It feels like a firm mass, when she presses on it she feels sensation of a pressure in her bowels I have not seen this patient in the past History of obesity, hyperglycemia, hypertension  She did have an umbilical hernia which developed during pregnancy years ago- did not really trouble her. She reports that she actually had this hernia repaired along with a bladder sling perhaps 20 years ago  However, an umbilical hernia was again present on a CT abdomen pelvis performed in 2014  She is status post hysterectomy  There was an issue with her colonoscopy in 2017-it sounds like they were not really able to pass the scope, they then tried a virtual colonoscopy but again could not complete the study They discuss further testing or possibly Cologuard, but I do not think this was done  She is otherwise feeling well, has no other concerns today    Patient Active Problem List   Diagnosis Date Noted  . Depression 08/12/2018  . Insulin resistance 07/25/2018  . Vitamin D deficiency 09/25/2017  . Prediabetes 09/25/2017  . Shortness of breath on exertion 09/10/2017  . Other fatigue 09/10/2017  . Hyperglycemia 09/10/2017  . Chronic constipation 09/10/2017  . Class 3 severe obesity with serious comorbidity and body mass index (BMI) of 45.0 to 49.9 in adult ([facility]) 09/10/2017   . Severe obesity (BMI >= 40) ([place]) 06/12/2014  . Lung nodule 11/19/2012  . MORBID OBESITY 03/28/2010  . POSTMENOPAUSAL STATUS 03/28/2010  . Asthma, episodic with mucus plugs 06/14/2009  . HYPOKALEMIA 02/21/2008  . Essential hypertension 09/06/2007    Past Medical History:  Diagnosis Date  . Asthma   . Chiari malformation type II ([place])    diagnosed when in high school  . Chiari malformation type II ([place])   . Colon polyp 2006   (TUBULAR ADENOMA)--COLONOSCOPY-DR. HAYES  . Constipation   . Fluid retention   . H/O seasonal allergies   . Medial meniscus tear    left  . Over weight   . Ulcerative colitis [facility])     Past Surgical History:  Procedure Laterality Date  . ABDOMINAL HYSTERECTOMY  1999   complete  . [place]  . HERNIA REPAIR  1194   umbilical    Social History   Tobacco Use  . Smoking status: Never Smoker  . Smokeless tobacco: Never Used  Substance Use Topics  . Alcohol use: Yes    Comment: 1 glass of wine 1-2 x a month  . Drug use: No    Family History  Problem Relation Age of Onset  . Heart disease Mother 43       2 MIs   . Stroke Mother 39  . Hypertension Mother   . Cancer Mother  lung, lymphoma  . Lung cancer Mother        was a smoker  . Kidney disease Mother   . Obesity Mother   . COPD Father        smoker  . Cancer Father        colon, lung prostate  . Alcohol abuse Father   . Hypertension Father   . Obesity Father   . Asthma Sister   . Alcohol abuse Brother   . Diabetes Paternal Uncle   . Breast cancer Neg Hx     No Known Allergies  Medication list has been reviewed and updated.  Current Outpatient Medications on File Prior to Visit  Medication Sig Dispense Refill  . buPROPion (WELLBUTRIN SR) 150 MG 12 hr tablet Take 1 tablet (150 mg total) by mouth daily. 30 tablet 1  . hydrochlorothiazide (HYDRODIURIL) 25 MG tablet Take 1 tablet (25 mg total) by mouth daily. 30 tablet 0  . Magnesium 250 MG TABS  Take 1 tablet by mouth daily.    . metFORMIN (GLUCOPHAGE) 500 MG tablet Take 1 tablet (500 mg total) by mouth daily with breakfast. 30 tablet 1  . polyethylene glycol powder (GLYCOLAX/MIRALAX) powder Take 17 g by mouth daily. 3350 g 0  . potassium chloride SA (K-DUR) 20 MEQ tablet TAKE 1 TABLET(20 MEQ) BY MOUTH DAILY 30 tablet 0  . Probiotic Product (ALIGN PO) Take 1 capsule by mouth daily.    . Vitamin D, Ergocalciferol, (DRISDOL) 1.25 MG (50000 UT) CAPS capsule Take 1 capsule (50,000 Units total) by mouth every 7 (seven) days. 4 capsule 0   Current Facility-Administered Medications on File Prior to Visit  Medication Dose Route Frequency Provider Last Rate Last Dose  . 0.9 %  sodium chloride infusion  500 mL Intravenous Continuous Danis, Estill Cotta III, MD        Review of Systems:  As per HPI- otherwise negative. No vomiting, she is still passing stool  Physical Examination: Vitals:   01/16/19 1423  BP: 128/88  Pulse: 84  Resp: 17  Temp: 98.5 F (36.9 C)  SpO2: 97%   Vitals:   01/16/19 1423  Weight: 272 lb (123.4 kg)  Height: 5\' 4"  (1.626 m)   Body mass index is 46.69 kg/m. Ideal Body Weight: Weight in (lb) to have BMI = 25: 145.3  GEN: WDWN, NAD, Non-toxic, A & O x 3, obese, looks well HEENT: Atraumatic, Normocephalic. Neck supple. No masses, No LAD. Ears and Nose: No external deformity. CV: RRR, No M/G/R. No JVD. No thrill. No extra heart sounds. PULM: CTA B, no wheezes, crackles, rhonchi. No retractions. No resp. distress. No accessory muscle use. ABD: S, NT, ND, +BS. No rebound. No HSM.  To the left of the umbilicus there is a firm mass, most likely an umbilical hernia It is not reducible and the patient is supine, but is not tender. She has minimal if any discomfort to pressure over the area Normal bowel sounds are present EXTR: No c/c/e NEURO Normal gait.  PSYCH: Normally interactive. Conversant. Not depressed or anxious appearing.  Calm demeanor. '  Assessment  and Plan:   ICD-10-CM   1. Intra-abdominal and pelvic swelling, mass and lump, unspecified site  R19.00 CT Abdomen Pelvis W Contrast    CBC    Comprehensive metabolic panel   Here today with a firm mass to the left of the umbilicus, present for 2 or 3 days.  I suspect this is an enlargement of known umbilical hernia.  Other consideration would be a mass of some type We attempted to get an outpatient CT for her today, but were unable to do so.  I offered her the option of going to the emergency room, or doing a CT tomorrow.  She is in no distress and would prefer to do the CT scan tomorrow.  I did advise her that if she were to develop pain or vomiting, fever she should go to the emergency room She states understanding and agreement  Follow-up: No follow-ups on file.  No orders of the defined types were placed in this encounter.  Orders Placed This Encounter  Procedures  . CT Abdomen Pelvis W Contrast  . CBC  . Comprehensive metabolic panel    @SIGN @    Signed Lamar Blinks, MD  Received her labs which are reassuring, message to patient Results for orders placed or performed in visit on 01/16/19  CBC  Result Value Ref Range   WBC 7.6 4.0 - 10.5 K/uL   RBC 4.37 3.87 - 5.11 Mil/uL   Platelets 301.0 150.0 - 400.0 K/uL   Hemoglobin 13.7 12.0 - 15.0 g/dL   HCT 41.0 36.0 - 46.0 %   MCV 93.8 78.0 - 100.0 fl   MCHC 33.4 30.0 - 36.0 g/dL   RDW 12.7 11.5 - 15.5 %  Comprehensive metabolic panel  Result Value Ref Range   Sodium 139 135 - 145 mEq/L   Potassium 4.0 3.5 - 5.1 mEq/L   Chloride 102 96 - 112 mEq/L   CO2 31 19 - 32 mEq/L   Glucose, Bld 87 70 - 99 mg/dL   BUN 20 6 - 23 mg/dL   Creatinine, Ser 0.85 0.40 - 1.20 mg/dL   Total Bilirubin 0.8 0.2 - 1.2 mg/dL   Alkaline Phosphatase 94 39 - 117 U/L   AST 22 0 - 37 U/L   ALT 25 0 - 35 U/L   Total Protein 7.1 6.0 - 8.3 g/dL   Albumin 4.2 3.5 - 5.2 g/dL   Calcium 9.2 8.4 - 10.5 mg/dL   GFR 68.21 >60.00 mL/min

## 2019-01-16 ENCOUNTER — Ambulatory Visit: Payer: 59 | Admitting: Family Medicine

## 2019-01-16 ENCOUNTER — Encounter: Payer: Self-pay | Admitting: Family Medicine

## 2019-01-16 DIAGNOSIS — R1900 Intra-abdominal and pelvic swelling, mass and lump, unspecified site: Secondary | ICD-10-CM | POA: Diagnosis not present

## 2019-01-16 DIAGNOSIS — K439 Ventral hernia without obstruction or gangrene: Secondary | ICD-10-CM

## 2019-01-16 LAB — COMPREHENSIVE METABOLIC PANEL
ALT: 25 U/L (ref 0–35)
AST: 22 U/L (ref 0–37)
Albumin: 4.2 g/dL (ref 3.5–5.2)
Alkaline Phosphatase: 94 U/L (ref 39–117)
BUN: 20 mg/dL (ref 6–23)
CO2: 31 mEq/L (ref 19–32)
Calcium: 9.2 mg/dL (ref 8.4–10.5)
Chloride: 102 mEq/L (ref 96–112)
Creatinine, Ser: 0.85 mg/dL (ref 0.40–1.20)
GFR: 68.21 mL/min (ref >60.00)
Glucose, Bld: 87 mg/dL (ref 70–99)
Potassium: 4 mEq/L (ref 3.5–5.1)
Sodium: 139 mEq/L (ref 135–145)
Total Bilirubin: 0.8 mg/dL (ref 0.2–1.2)
Total Protein: 7.1 g/dL (ref 6.0–8.3)

## 2019-01-16 LAB — CBC
HCT: 41 % (ref 36.0–46.0)
Hemoglobin: 13.7 g/dL (ref 12.0–15.0)
MCHC: 33.4 g/dL (ref 30.0–36.0)
MCV: 93.8 fl (ref 78.0–100.0)
Platelets: 301 10*3/uL (ref 150.0–400.0)
RBC: 4.37 Mil/uL (ref 3.87–5.11)
RDW: 12.7 % (ref 11.5–15.5)
WBC: 7.6 10*3/uL (ref 4.0–10.5)

## 2019-01-17 ENCOUNTER — Other Ambulatory Visit: Payer: Self-pay

## 2019-01-17 ENCOUNTER — Ambulatory Visit (HOSPITAL_BASED_OUTPATIENT_CLINIC_OR_DEPARTMENT_OTHER)
Admission: RE | Admit: 2019-01-17 | Discharge: 2019-01-17 | Disposition: A | Discharge status: Home / Self Care | Payer: 59 | Source: Ambulatory Visit | Attending: Family Medicine | Admitting: Family Medicine

## 2019-01-17 DIAGNOSIS — R1900 Intra-abdominal and pelvic swelling, mass and lump, unspecified site: Secondary | ICD-10-CM | POA: Insufficient documentation

## 2019-01-17 DIAGNOSIS — K760 Fatty (change of) liver, not elsewhere classified: Secondary | ICD-10-CM | POA: Diagnosis not present

## 2019-01-17 DIAGNOSIS — K5732 Diverticulitis of large intestine without perforation or abscess without bleeding: Secondary | ICD-10-CM | POA: Diagnosis not present

## 2019-01-17 MED ORDER — IOHEXOL 300 MG/ML  SOLN
100.0000 mL | Freq: Once | INTRAMUSCULAR | Status: AC | PRN
  Administered 2019-01-17: 100 mL via INTRAVENOUS

## 2019-01-17 NOTE — Telephone Encounter (Signed)
Called pt and let her know- ventral hernia Referral to general surgery No heavy lifting or vigorous exercise for now If significant pain that does not resolve with laying down, go to ER She states understanding

## 2019-01-18 ENCOUNTER — Encounter: Payer: Self-pay | Admitting: Family Medicine

## 2019-01-23 ENCOUNTER — Encounter: Payer: Self-pay | Admitting: Family Medicine

## 2019-01-25 ENCOUNTER — Other Ambulatory Visit (INDEPENDENT_AMBULATORY_CARE_PROVIDER_SITE_OTHER): Payer: Self-pay | Admitting: Physician Assistant

## 2019-01-25 DIAGNOSIS — I10 Essential (primary) hypertension: Secondary | ICD-10-CM

## 2019-01-25 DIAGNOSIS — F3289 Other specified depressive episodes: Secondary | ICD-10-CM

## 2019-02-04 ENCOUNTER — Other Ambulatory Visit: Payer: Self-pay

## 2019-02-04 ENCOUNTER — Ambulatory Visit (INDEPENDENT_AMBULATORY_CARE_PROVIDER_SITE_OTHER): Payer: 59 | Admitting: Physician Assistant

## 2019-02-04 VITALS — BP 124/85 | HR 74 | Temp 97.9°F | Ht 64.0 in | Wt 267.0 lb

## 2019-02-04 DIAGNOSIS — Z6841 Body Mass Index (BMI) 40.0 and over, adult: Secondary | ICD-10-CM | POA: Diagnosis not present

## 2019-02-04 DIAGNOSIS — E8881 Metabolic syndrome: Secondary | ICD-10-CM

## 2019-02-04 DIAGNOSIS — Z9189 Other specified personal risk factors, not elsewhere classified: Secondary | ICD-10-CM | POA: Diagnosis not present

## 2019-02-04 DIAGNOSIS — R0602 Shortness of breath: Secondary | ICD-10-CM | POA: Diagnosis not present

## 2019-02-04 DIAGNOSIS — I10 Essential (primary) hypertension: Secondary | ICD-10-CM

## 2019-02-04 MED ORDER — POTASSIUM CHLORIDE CRYS ER 20 MEQ PO TBCR: EXTENDED_RELEASE_TABLET | ORAL | 0 refills | Status: DC

## 2019-02-04 MED ORDER — HYDROCHLOROTHIAZIDE 25 MG PO TABS: 25.0000 mg | ORAL_TABLET | Freq: Every day | ORAL | 0 refills | Status: DC

## 2019-02-04 NOTE — Progress Notes (Signed)
Office: [phone]  /  Fax: [phone]   HPI:   Chief Complaint: OBESITY Audrey Duncan is here to discuss her progress with her obesity treatment plan. She is on the Category 2 plan and is following her eating plan approximately 80% of the time. She states she is exercising 0 minutes 0 times per week. Nezzie reports that she has been following the plan closely and is frustrated with the lack of weight loss. Her weight is 267 lb (121.1 kg) today and has had a weight gain of 3 lbs since her last visit. She has lost 11 lbs since starting treatment with [place].  Hypertension Audrey Duncan is a 58 y.o. female with hypertension and is on HCTZ and potassium.  Audrey Duncan denies chest pain or headache. She is working weight loss to help control her blood pressure with the goal of decreasing her risk of heart attack and stroke.  At risk for cardiovascular disease Audrey Duncan is at a higher than average risk for cardiovascular disease due to obesity. She currently denies any chest pain.  Insulin Resistance Audrey Duncan has a diagnosis of insulin resistance based on her elevated fasting insulin level >5. Although Audrey Duncan's blood glucose readings are still under good control, insulin resistance puts her at greater risk of metabolic syndrome and diabetes. She is taking metformin currently and continues to work on diet and exercise to decrease risk of diabetes. No polyphagia.  Shortness of Breath Audrey Duncan notes increasing shortness of breath with exertion. She denies dizziness or lightheadedness.  ASSESSMENT AND PLAN:  Essential hypertension - Plan: VITAMIN Duncan 25 Hydroxy (Vit-Duncan Deficiency, Fractures), hydrochlorothiazide (HYDRODIURIL) 25 MG tablet, potassium chloride SA (K-DUR) 20 MEQ tablet  Insulin resistance - Plan: Hemoglobin A1c, Insulin, random, VITAMIN Duncan 25 Hydroxy (Vit-Duncan Deficiency, Fractures)  Shortness of breath on exertion  At risk for heart disease  Class 3 severe obesity with  serious comorbidity and body mass index (BMI) of 45.0 to 49.9 in adult, unspecified obesity type ([place])  PLAN:  Hypertension We discussed sodium restriction, working on healthy weight loss, and a regular exercise program as the means to achieve improved blood pressure control. Audrey Duncan agreed with this plan and agreed to follow up as directed. We will continue to monitor her blood pressure as well as her progress with the above lifestyle modifications. Audrey Duncan was given a refill on her HCTZ #30 with 0 refills and a refill on her potassium #30 with 0 refills and agrees to follow-up with our clinic in 3 weeks. She will watch for signs of hypotension as she continues her lifestyle modifications.  Cardiovascular risk counseling Audrey Duncan was given extended (15 minutes) coronary artery disease prevention counseling today. She is 58 y.o. female and has risk factors for heart disease including obesity. We discussed intensive lifestyle modifications today with an emphasis on specific weight loss instructions and strategies. Pt was also informed of the importance of increasing exercise and decreasing saturated fats to help prevent heart disease.  Insulin Resistance Audrey Duncan will continue to work on weight loss, exercise, and decreasing simple carbohydrates in her diet to help decrease the risk of diabetes. We dicussed metformin including benefits and risks. She was informed that eating too many simple carbohydrates or too many calories at one sitting increases the likelihood of GI side effects. Audrey Duncan will continue her medications and weight loss. She will follow-up with [place] as directed to monitor her progress.  Shortness of Breath Audrey Duncan's shortness of breath appears to be obesity related and exercise induced. The indirect  calorimeter results showed VO2 of 280 and a REE of 1951. She has agreed to work on weight loss and gradually increase exercise to treat her exercise induced shortness of breath. If  Audrey Duncan follows our instructions and loses weight without improvement of her shortness of breath, we will plan to refer to Pulmonology. Audrey Duncan agrees to this plan.  Obesity Audrey Duncan is currently in the action stage of change. As such, her goal is to continue with weight loss efforts. She has agreed to change and follow the Category 3 plan. Audrey Duncan has been instructed to work up to a goal of 150 minutes of combined cardio and strengthening exercise per week for weight loss and overall health benefits. We discussed the following Behavioral Modification Strategies today: work on meal planning and easy cooking plans, and keeping healthy foods in the home.  Audrey Duncan has agreed to follow-up with our clinic in 3 weeks. She was informed of the importance of frequent follow-up visits to maximize her success with intensive lifestyle modifications for her multiple health conditions.  ALLERGIES: No Known Allergies  MEDICATIONS: Current Outpatient Medications on File Prior to Visit  Medication Sig Dispense Refill   buPROPion (WELLBUTRIN SR) 150 MG 12 hr tablet Take 1 tablet (150 mg total) by mouth daily. 30 tablet 1   Magnesium 250 MG TABS Take 1 tablet by mouth daily.     metFORMIN (GLUCOPHAGE) 500 MG tablet Take 1 tablet (500 mg total) by mouth daily with breakfast. 30 tablet 1   polyethylene glycol powder (GLYCOLAX/MIRALAX) powder Take 17 g by mouth daily. 3350 g 0   Probiotic Product (ALIGN PO) Take 1 capsule by mouth daily.     Vitamin Duncan, Ergocalciferol, (DRISDOL) 1.25 MG (50000 UT) CAPS capsule Take 1 capsule (50,000 Units total) by mouth every 7 (seven) days. 4 capsule 0   Current Facility-Administered Medications on File Prior to Visit  Medication Dose Route Frequency Provider Last Rate Last Dose   0.9 %  sodium chloride infusion  500 mL Intravenous Continuous Danis, Kirke Corin, MD        PAST MEDICAL HISTORY: Past Medical History:  Diagnosis Date   Asthma    Chiari  malformation type II ([place])    diagnosed when in high school   Chiari malformation type II ([place])    Colon polyp 2006   (TUBULAR ADENOMA)--COLONOSCOPY-DR. HAYES   Constipation    Fluid retention    H/O seasonal allergies    Medial meniscus tear    left   Over weight    Ulcerative colitis ([place])     PAST SURGICAL HISTORY: Past Surgical History:  Procedure Laterality Date   ABDOMINAL HYSTERECTOMY  1999   complete   CESAREAN SECTION  1995 and 1998   HERNIA REPAIR  0350   umbilical    SOCIAL HISTORY: Social History   Tobacco Use   Smoking status: Never Smoker   Smokeless tobacco: Never Used  Substance Use Topics   Alcohol use: Yes    Comment: 1 glass of wine 1-2 x a month   Drug use: No    FAMILY HISTORY: Family History  Problem Relation Age of Onset   Heart disease Mother 82       2 MIs    Stroke Mother 70   Hypertension Mother    Cancer Mother        lung, lymphoma   Lung cancer Mother        was a smoker   Kidney disease Mother  Obesity Mother    COPD Father        smoker   Cancer Father        colon, lung prostate   Alcohol abuse Father    Hypertension Father    Obesity Father    Asthma Sister    Alcohol abuse Brother    Diabetes Paternal Uncle    Breast cancer Neg Hx    ROS: Review of Systems  Respiratory: Positive for shortness of breath (on exertion).   Cardiovascular: Negative for chest pain.  Neurological: Negative for dizziness and headaches.       Negative for lightheadedness.  Endo/Heme/Allergies:       Negative for polyphagia.   PHYSICAL EXAM: Blood pressure 124/85, pulse 74, temperature 97.9 F (36.6 C), temperature source Oral, height 5\' 4"  (1.626 m), weight 267 lb (121.1 kg), SpO2 96 %. Body mass index is 45.83 kg/m. Physical Exam Vitals signs reviewed.  Constitutional:      Appearance: Normal appearance. She is obese.  Cardiovascular:     Rate and Rhythm: Normal rate.     Pulses: Normal pulses.   Pulmonary:     Effort: Pulmonary effort is normal.     Breath sounds: Normal breath sounds.  Musculoskeletal: Normal range of motion.  Skin:    General: Skin is warm and dry.  Neurological:     Mental Status: She is alert and oriented to person, place, and time.  Psychiatric:        Behavior: Behavior normal.   RECENT LABS AND TESTS: BMET    Component Value Date/Time   NA 139 01/16/2019 1500   NA 142 06/05/2018 0902   K 4.0 01/16/2019 1500   CL 102 01/16/2019 1500   CO2 31 01/16/2019 1500   GLUCOSE 87 01/16/2019 1500   GLUCOSE 95 05/11/2006 1148   BUN 20 01/16/2019 1500   BUN 18 06/05/2018 0902   CREATININE 0.85 01/16/2019 1500   CREATININE 0.96 05/12/2016 1612   CALCIUM 9.2 01/16/2019 1500   GFRNONAA 67 06/05/2018 0902   GFRAA 77 06/05/2018 0902   Lab Results  Component Value Date   HGBA1C 5.4 06/05/2018   HGBA1C 5.3 09/10/2017   Lab Results  Component Value Date   INSULIN 37.8 (H) 06/05/2018   INSULIN 27.9 (H) 02/04/2018   INSULIN 39.9 (H) 09/10/2017   CBC    Component Value Date/Time   WBC 7.6 01/16/2019 1500   RBC 4.37 01/16/2019 1500   HGB 13.7 01/16/2019 1500   HGB 14.5 09/10/2017 0942   HCT 41.0 01/16/2019 1500   HCT 44.2 09/10/2017 0942   PLT 301.0 01/16/2019 1500   MCV 93.8 01/16/2019 1500   MCV 96 09/10/2017 0942   MCH 31.5 09/10/2017 0942   MCH 30.7 05/12/2016 1612   MCHC 33.4 01/16/2019 1500   RDW 12.7 01/16/2019 1500   RDW 13.4 09/10/2017 0942   LYMPHSABS 1.7 09/10/2017 0942   MONOABS 696 05/12/2016 1612   EOSABS 0.2 09/10/2017 0942   BASOSABS 0.1 09/10/2017 0942   Iron/TIBC/Ferritin/ %Sat No results found for: IRON, TIBC, FERRITIN, IRONPCTSAT Lipid Panel     Component Value Date/Time   CHOL 146 06/05/2018 0902   TRIG 87 06/05/2018 0902   HDL 52 06/05/2018 0902   CHOLHDL 3 06/11/2017 0950   VLDL 14.8 06/11/2017 0950   LDLCALC 77 06/05/2018 0902   Hepatic Function Panel     Component Value Date/Time   PROT 7.1 01/16/2019 1500    PROT 6.8 06/05/2018 0902  ALBUMIN 4.2 01/16/2019 1500   ALBUMIN 4.4 06/05/2018 0902   AST 22 01/16/2019 1500   ALT 25 01/16/2019 1500   ALKPHOS 94 01/16/2019 1500   BILITOT 0.8 01/16/2019 1500   BILITOT 0.6 06/05/2018 0902   BILIDIR 0.1 06/12/2014 1359      Component Value Date/Time   TSH 2.180 09/10/2017 0942   TSH 2.62 05/12/2016 1612   TSH 1.87 06/12/2014 1359   Results for Duren, Zyria R (MRN [id number]) as of 02/04/2019 10:30  Ref. Range 06/05/2018 09:02  Vitamin Duncan, 25-Hydroxy Latest Ref Range: 30.0 - 100.0 ng/mL 40.4   OBESITY BEHAVIORAL INTERVENTION VISIT  Today's visit was #21   Starting weight: 279 lbs Starting date: 09/10/2017 Today's weight: 267 lbs Today's date: 02/04/2019 Total lbs lost to date: 11   02/04/2019  Height 5\' 4"  (1.626 m)  Weight 267 lb (121.1 kg)  BMI (Calculated) 45.81  BLOOD PRESSURE - SYSTOLIC 712  BLOOD PRESSURE - DIASTOLIC 85   Body Fat % 45.8 %  Total Body Water (lbs) 90.4 lbs  RMR 1951   ASK: We discussed the diagnosis of obesity with Audrey Duncan today and Audrey Duncan agreed to give [place] permission to discuss obesity behavioral modification therapy today.  ASSESS: Audrey Duncan has the diagnosis of obesity and her BMI today is 45.8. Audrey Duncan is in the action stage of change.   ADVISE: Aashi was educated on the multiple health risks of obesity as well as the benefit of weight loss to improve her health. She was advised of the need for long term treatment and the importance of lifestyle modifications to improve her current health and to decrease her risk of future health problems.  AGREE: Multiple dietary modification options and treatment options were discussed and  Audrey Duncan agreed to follow the recommendations documented in the above note.  ARRANGE: Audrey Duncan was educated on the importance of frequent visits to treat obesity as outlined per CMS and USPSTF guidelines and agreed to schedule her next follow up appointment  today.  Audrey Dk, am acting as transcriptionist for Abby Potash, PA-C I, Abby Potash, PA-C have reviewed above note and agree with its content

## 2019-02-05 ENCOUNTER — Other Ambulatory Visit (INDEPENDENT_AMBULATORY_CARE_PROVIDER_SITE_OTHER): Payer: Self-pay | Admitting: Physician Assistant

## 2019-02-05 DIAGNOSIS — R7303 Prediabetes: Secondary | ICD-10-CM

## 2019-02-05 LAB — INSULIN, RANDOM: INSULIN: 26.2 u[IU]/mL — ABNORMAL HIGH (ref 2.6–24.9)

## 2019-02-05 LAB — HEMOGLOBIN A1C
Est. average glucose Bld gHb Est-mCnc: 105 mg/dL
Hgb A1c MFr Bld: 5.3 % (ref 4.8–5.6)

## 2019-02-05 LAB — VITAMIN D 25 HYDROXY (VIT D DEFICIENCY, FRACTURES): Vit D, 25-Hydroxy: 42.2 ng/mL (ref 30.0–100.0)

## 2019-02-15 ENCOUNTER — Other Ambulatory Visit (INDEPENDENT_AMBULATORY_CARE_PROVIDER_SITE_OTHER): Payer: Self-pay | Admitting: Physician Assistant

## 2019-02-15 DIAGNOSIS — F3289 Other specified depressive episodes: Secondary | ICD-10-CM

## 2019-02-23 ENCOUNTER — Encounter (INDEPENDENT_AMBULATORY_CARE_PROVIDER_SITE_OTHER): Payer: Self-pay | Admitting: Physician Assistant

## 2019-02-25 ENCOUNTER — Ambulatory Visit (INDEPENDENT_AMBULATORY_CARE_PROVIDER_SITE_OTHER): Payer: 59 | Admitting: Physician Assistant

## 2019-02-26 ENCOUNTER — Other Ambulatory Visit (INDEPENDENT_AMBULATORY_CARE_PROVIDER_SITE_OTHER): Payer: Self-pay | Admitting: Physician Assistant

## 2019-02-26 ENCOUNTER — Encounter (INDEPENDENT_AMBULATORY_CARE_PROVIDER_SITE_OTHER): Payer: Self-pay | Admitting: Physician Assistant

## 2019-02-26 ENCOUNTER — Other Ambulatory Visit: Payer: Self-pay

## 2019-02-26 ENCOUNTER — Ambulatory Visit (INDEPENDENT_AMBULATORY_CARE_PROVIDER_SITE_OTHER): Payer: 59 | Admitting: Physician Assistant

## 2019-02-26 DIAGNOSIS — I10 Essential (primary) hypertension: Secondary | ICD-10-CM

## 2019-02-26 DIAGNOSIS — Z6841 Body Mass Index (BMI) 40.0 and over, adult: Secondary | ICD-10-CM

## 2019-02-26 DIAGNOSIS — E559 Vitamin D deficiency, unspecified: Secondary | ICD-10-CM

## 2019-02-26 MED ORDER — VITAMIN D (ERGOCALCIFEROL) 1.25 MG (50000 UNIT) PO CAPS: 50000.0000 [IU] | ORAL_CAPSULE | ORAL | 0 refills | Status: DC

## 2019-02-26 MED ORDER — POTASSIUM CHLORIDE CRYS ER 20 MEQ PO TBCR: EXTENDED_RELEASE_TABLET | ORAL | 0 refills | Status: DC

## 2019-02-26 MED ORDER — HYDROCHLOROTHIAZIDE 25 MG PO TABS: 25.0000 mg | ORAL_TABLET | Freq: Every day | ORAL | 0 refills | Status: DC

## 2019-03-01 ENCOUNTER — Encounter (INDEPENDENT_AMBULATORY_CARE_PROVIDER_SITE_OTHER): Payer: Self-pay | Admitting: Physician Assistant

## 2019-03-03 NOTE — Progress Notes (Signed)
Office: [phone]  /  Fax: [phone] TeleHealth Visit:  Audrey Duncan has verbally consented to this TeleHealth visit today. The patient is located at home, the provider is located at the [facility] office. The participants in this visit include the listed provider and patient. The visit was conducted today via face time.  HPI:   Chief Complaint: OBESITY Audrey Duncan is here to discuss her progress with her obesity treatment plan. She is on the Category 3 plan and is following her eating plan approximately 85-90 % of the time. She states she is walking some. Jilene reports that changing to Category 3 has been a challenge. She is having trouble getting all of the food in on the plan, especially the protein at night.  We were unable to weigh the patient today for this TeleHealth visit. She feels as if she has maintained her weight since her last visit. She has lost 11 lbs since starting treatment with [place].  Hypertension Audrey Duncan is a 58 y.o. female with hypertension. [name] is on hydrochlorothiazide and potassium. She denies chest pain or headaches. She is working on weight loss to help control her blood pressure with the goal of decreasing her risk of heart attack and stroke.   Vitamin D Deficiency Audrey Duncan has a diagnosis of vitamin D deficiency. She is currently taking prescription Vit D and denies nausea, vomiting or muscle weakness.  ASSESSMENT AND PLAN:  Class 3 severe obesity with serious comorbidity and body mass index (BMI) of 45.0 to 49.9 in adult, unspecified obesity type ([facility])  Essential hypertension - Plan: potassium chloride SA (K-DUR) 20 MEQ tablet, hydrochlorothiazide (HYDRODIURIL) 25 MG tablet  Vitamin D deficiency - Plan: Vitamin D, Ergocalciferol, (DRISDOL) 1.25 MG (50000 UT) CAPS capsule  PLAN:  Hypertension We discussed sodium restriction, working on healthy weight loss, and a regular exercise program as the means to achieve  improved blood pressure control. Audrey Duncan agreed with this plan and agreed to follow up as directed. We will continue to monitor her blood pressure as well as her progress with the above lifestyle modifications. Azaline agrees to continue taking potassium chloride 20 meq PO daily #30 and we will refill for 1 month; and she agrees to continue taking hydrochlorothiazide 25 mg PO daily #30 and we will refill for 1 month. She will watch for signs of hypotension as she continues her lifestyle modifications. [name] agrees to follow up with our clinic in 3 weeks.  Vitamin D Deficiency Audrey Duncan was informed that low vitamin D levels contributes to fatigue and are associated with obesity, breast, and colon cancer. Audrey Duncan agrees to continue taking prescription Vit D 50,000 IU every week #4 and we will refill for 1 month; and she is to add OTC Vit D 4,000 IU daily and will follow up for routine testing of vitamin D, at least 2-3 times per year. She was informed of the risk of over-replacement of vitamin D and agrees to not increase her dose unless she discusses this with [place] first. Audrey Duncan agrees to follow up with our clinic in 3 weeks.  Obesity Audrey Duncan is currently in the action stage of change. As such, her goal is to continue with weight loss efforts She has agreed to follow the Category 3 plan Audrey Duncan has been instructed to work up to a goal of 150 minutes of combined cardio and strengthening exercise per week for weight loss and overall health benefits. We discussed the following Behavioral Modification Strategies today: increasing lean protein intake  and no skipping meals   Audrey Duncan has agreed to follow up with our clinic in 3 weeks. She was informed of the importance of frequent follow up visits to maximize her success with intensive lifestyle modifications for her multiple health conditions.  ALLERGIES: No Known Allergies  MEDICATIONS: Current Outpatient Medications on File Prior to Visit   Medication Sig Dispense Refill  . buPROPion (WELLBUTRIN SR) 150 MG 12 hr tablet Take 1 tablet (150 mg total) by mouth daily. 30 tablet 1  . Magnesium 250 MG TABS Take 1 tablet by mouth daily.    . metFORMIN (GLUCOPHAGE) 500 MG tablet Take 1 tablet (500 mg total) by mouth daily with breakfast. 30 tablet 1  . polyethylene glycol powder (GLYCOLAX/MIRALAX) powder Take 17 g by mouth daily. 3350 g 0  . Probiotic Product (ALIGN PO) Take 1 capsule by mouth daily.     Current Facility-Administered Medications on File Prior to Visit  Medication Dose Route Frequency Provider Last Rate Last Dose  . 0.9 %  sodium chloride infusion  500 mL Intravenous Continuous Doran Stabler, MD        PAST MEDICAL HISTORY: Past Medical History:  Diagnosis Date  . Asthma   . Chiari malformation type II ([place])    diagnosed when in high school  . Chiari malformation type II ([place])   . Colon polyp 2006   (TUBULAR ADENOMA)--COLONOSCOPY-DR. HAYES  . Constipation   . Fluid retention   . H/O seasonal allergies   . Medial meniscus tear    left  . Over weight   . Ulcerative colitis ([place])     PAST SURGICAL HISTORY: Past Surgical History:  Procedure Laterality Date  . ABDOMINAL HYSTERECTOMY  1999   complete  . [place]  . HERNIA REPAIR  [id number]   umbilical    SOCIAL HISTORY: Social History   Tobacco Use  . Smoking status: Never Smoker  . Smokeless tobacco: Never Used  Substance Use Topics  . Alcohol use: Yes    Comment: 1 glass of wine 1-2 x a month  . Drug use: No    FAMILY HISTORY: Family History  Problem Relation Age of Onset  . Heart disease Mother 21       2 MIs   . Stroke Mother 79  . Hypertension Mother   . Cancer Mother        lung, lymphoma  . Lung cancer Mother        was a smoker  . Kidney disease Mother   . Obesity Mother   . COPD Father        smoker  . Cancer Father        colon, lung prostate  . Alcohol abuse Father   . Hypertension Father   .  Obesity Father   . Asthma Sister   . Alcohol abuse Brother   . Diabetes Paternal Uncle   . Breast cancer Neg Hx     ROS: Review of Systems  Constitutional: Negative for weight loss.  Cardiovascular: Negative for chest pain.  Gastrointestinal: Negative for nausea and vomiting.  Musculoskeletal:       Negative muscle weakness  Neurological: Negative for headaches.    PHYSICAL EXAM: Pt in no acute distress  RECENT LABS AND TESTS: BMET    Component Value Date/Time   NA 139 01/16/2019 1500   NA 142 06/05/2018 0902   K 4.0 01/16/2019 1500   CL 102 01/16/2019 1500   CO2 31  01/16/2019 1500   GLUCOSE 87 01/16/2019 1500   GLUCOSE 95 05/11/2006 1148   BUN 20 01/16/2019 1500   BUN 18 06/05/2018 0902   CREATININE 0.85 01/16/2019 1500   CREATININE 0.96 05/12/2016 1612   CALCIUM 9.2 01/16/2019 1500   GFRNONAA 67 06/05/2018 0902   GFRAA 77 06/05/2018 0902   Lab Results  Component Value Date   HGBA1C 5.3 02/04/2019   HGBA1C 5.4 06/05/2018   HGBA1C 5.3 09/10/2017   Lab Results  Component Value Date   INSULIN 26.2 (H) 02/04/2019   INSULIN 37.8 (H) 06/05/2018   INSULIN 27.9 (H) 02/04/2018   INSULIN 39.9 (H) 09/10/2017   CBC    Component Value Date/Time   WBC 7.6 01/16/2019 1500   RBC 4.37 01/16/2019 1500   HGB 13.7 01/16/2019 1500   HGB 14.5 09/10/2017 0942   HCT 41.0 01/16/2019 1500   HCT 44.2 09/10/2017 0942   PLT 301.0 01/16/2019 1500   MCV 93.8 01/16/2019 1500   MCV 96 09/10/2017 0942   MCH 31.5 09/10/2017 0942   MCH 30.7 05/12/2016 1612   MCHC 33.4 01/16/2019 1500   RDW 12.7 01/16/2019 1500   RDW 13.4 09/10/2017 0942   LYMPHSABS 1.7 09/10/2017 0942   MONOABS 696 05/12/2016 1612   EOSABS 0.2 09/10/2017 0942   BASOSABS 0.1 09/10/2017 0942   Iron/TIBC/Ferritin/ %Sat No results found for: IRON, TIBC, FERRITIN, IRONPCTSAT Lipid Panel     Component Value Date/Time   CHOL 146 06/05/2018 0902   TRIG 87 06/05/2018 0902   HDL 52 06/05/2018 0902   CHOLHDL 3  06/11/2017 0950   VLDL 14.8 06/11/2017 0950   LDLCALC 77 06/05/2018 0902   Hepatic Function Panel     Component Value Date/Time   PROT 7.1 01/16/2019 1500   PROT 6.8 06/05/2018 0902   ALBUMIN 4.2 01/16/2019 1500   ALBUMIN 4.4 06/05/2018 0902   AST 22 01/16/2019 1500   ALT 25 01/16/2019 1500   ALKPHOS 94 01/16/2019 1500   BILITOT 0.8 01/16/2019 1500   BILITOT 0.6 06/05/2018 0902   BILIDIR 0.1 06/12/2014 1359      Component Value Date/Time   TSH 2.180 09/10/2017 0942   TSH 2.62 05/12/2016 1612   TSH 1.87 06/12/2014 1359      I, Trixie Dredge, am acting as transcriptionist for Abby Potash, PA-C I, Abby Potash, PA-C have reviewed above note and agree with its content

## 2019-03-04 ENCOUNTER — Ambulatory Visit (INDEPENDENT_AMBULATORY_CARE_PROVIDER_SITE_OTHER): Payer: 59 | Admitting: Physician Assistant

## 2019-03-19 ENCOUNTER — Ambulatory Visit (INDEPENDENT_AMBULATORY_CARE_PROVIDER_SITE_OTHER): Payer: 59 | Admitting: Physician Assistant

## 2019-03-19 ENCOUNTER — Other Ambulatory Visit: Payer: Self-pay

## 2019-03-19 ENCOUNTER — Encounter (INDEPENDENT_AMBULATORY_CARE_PROVIDER_SITE_OTHER): Payer: Self-pay | Admitting: Physician Assistant

## 2019-03-19 VITALS — BP 127/84 | HR 75 | Temp 97.9°F | Ht 64.0 in | Wt 271.0 lb

## 2019-03-19 DIAGNOSIS — Z6841 Body Mass Index (BMI) 40.0 and over, adult: Secondary | ICD-10-CM

## 2019-03-19 DIAGNOSIS — F3289 Other specified depressive episodes: Secondary | ICD-10-CM | POA: Diagnosis not present

## 2019-03-19 DIAGNOSIS — R7303 Prediabetes: Secondary | ICD-10-CM

## 2019-03-19 DIAGNOSIS — I10 Essential (primary) hypertension: Secondary | ICD-10-CM | POA: Diagnosis not present

## 2019-03-19 DIAGNOSIS — E559 Vitamin D deficiency, unspecified: Secondary | ICD-10-CM

## 2019-03-19 DIAGNOSIS — Z9189 Other specified personal risk factors, not elsewhere classified: Secondary | ICD-10-CM

## 2019-03-19 MED ORDER — HYDROCHLOROTHIAZIDE 25 MG PO TABS: 25.0000 mg | ORAL_TABLET | Freq: Every day | ORAL | 1 refills | Status: DC

## 2019-03-19 MED ORDER — BUPROPION HCL ER (SR) 150 MG PO TB12: 150.0000 mg | ORAL_TABLET | Freq: Every day | ORAL | 1 refills | Status: DC

## 2019-03-19 MED ORDER — METFORMIN HCL 500 MG PO TABS: 500.0000 mg | ORAL_TABLET | Freq: Every day | ORAL | 2 refills | Status: DC

## 2019-03-19 MED ORDER — POTASSIUM CHLORIDE CRYS ER 20 MEQ PO TBCR: EXTENDED_RELEASE_TABLET | ORAL | 1 refills | Status: DC

## 2019-03-19 MED ORDER — VITAMIN D (ERGOCALCIFEROL) 1.25 MG (50000 UNIT) PO CAPS: 50000.0000 [IU] | ORAL_CAPSULE | ORAL | 0 refills | Status: DC

## 2019-03-19 NOTE — Progress Notes (Signed)
Office: [phone]  /  Fax: [phone]   HPI:   Chief Complaint: OBESITY Audrey Duncan is here to discuss her progress with her obesity treatment plan. She is on the Category 3 plan and is following her eating plan approximately 90% of the time. She states she is walking 30 minutes 3 times per week. Audrey Duncan is frustrated today due to lack of weight loss because she states she has been following the plan well. However, she is sometimes skipping lunch. She states she is traveling to the beach next week.  Her weight is 271 lb (122.9 kg) today and has had a weight gain of 4 lbs since her last visit. She has lost 8 lbs since starting treatment with [place].  Hypertension Audrey Duncan is a 58 y.o. female with hypertension and is on HCTZ and potassiuim.  Audrey Duncan denies chest pain. She is working weight loss to help control her blood pressure with the goal of decreasing her risk of heart attack and stroke. Audrey Duncan's blood pressure is normal.  At risk for cardiovascular disease Audrey Duncan is at a higher than average risk for cardiovascular disease due to obesity. She currently denies any chest pain.  Depression with emotional eating behaviors Audrey Duncan is struggling with emotional eating and using food for comfort to the extent that it is negatively impacting her health. She often snacks when she is not hungry. Audrey Duncan sometimes feels she is out of control and then feels guilty that she made poor food choices. She has been working on behavior modification techniques to help reduce her emotional eating and has been somewhat successful. Audrey Duncan is on bupropion and shows no sign of suicidal or homicidal ideations.  Depression screen [facility] 2/9 09/10/2017 06/11/2017 05/12/2016 01/06/2013  Decreased Interest 2 0 0 0  Down, Depressed, Hopeless 1 0 0 0  PHQ - 2 Score 3 0 0 0  Altered sleeping 2 - - -  Tired, decreased energy 3 - - -  Change in appetite 1 - - -  Feeling bad or failure about  yourself  2 - - -  Trouble concentrating 0 - - -  Moving slowly or fidgety/restless 0 - - -  Suicidal thoughts 0 - - -  PHQ-9 Score 11 - - -  Difficult doing work/chores Not difficult at all - - -   Vitamin D deficiency Audrey Duncan has a diagnosis of Vitamin D deficiency. She is currently taking prescription Vit D and denies nausea, vomiting or muscle weakness.  Pre-Diabetes Audrey Duncan has a diagnosis of prediabetes based on her elevated Hgb A1c and was informed this puts her at greater risk of developing diabetes. She is taking metformin currently and continues to work on diet and exercise to decrease risk of diabetes. She denies nausea, vomiting, or diarrhea. No polyphagia.  ASSESSMENT AND PLAN:  Essential hypertension - Plan: hydrochlorothiazide (HYDRODIURIL) 25 MG tablet, potassium chloride SA (K-DUR) 20 MEQ tablet  Vitamin D deficiency - Plan: Vitamin D, Ergocalciferol, (DRISDOL) 1.25 MG (50000 UT) CAPS capsule  Prediabetes - Plan: metFORMIN (GLUCOPHAGE) 500 MG tablet  Other depression - with emotional eating  - Plan: buPROPion (WELLBUTRIN SR) 150 MG 12 hr tablet  At risk for heart disease  Class 3 severe obesity with serious comorbidity and body mass index (BMI) of 45.0 to 49.9 in adult, unspecified obesity type ([place])  PLAN:  Hypertension We discussed sodium restriction, working on healthy weight loss, and a regular exercise program as the means to achieve improved blood pressure control. Audrey Duncan agreed  with this plan and agreed to follow up as directed. We will continue to monitor her blood pressure as well as her progress with the above lifestyle modifications. Audrey Duncan was given a refill on her HCTZ #30 with 1 refill and a refill on her potassium #30 with 1 refill and she agrees to follow-up with our clinic in 3 weeks. She will watch for signs of hypotension as she continues her lifestyle modifications.  Cardiovascular risk counseling Audrey Duncan was given extended (15 minutes)  coronary artery disease prevention counseling today. She is 58 y.o. female and has risk factors for heart disease including obesity. We discussed intensive lifestyle modifications today with an emphasis on specific weight loss instructions and strategies. Pt was also informed of the importance of increasing exercise and decreasing saturated fats to help prevent heart disease.  Depression with Emotional Eating Behaviors We discussed behavior modification techniques today to help Audrey Duncan deal with her emotional eating and depression. Audrey Duncan was given a refill on her Wellbutrin #30 with 1 refill and agrees to follow-up with our clinic in 3 weeks.  Vitamin D Deficiency Audrey Duncan was informed that low Vitamin D levels contributes to fatigue and are associated with obesity, breast, and colon cancer. She agrees to continue to take prescription Vit D @ 50,000 IU every week #4 with 0 refills and will follow-up for routine testing of Vitamin D, at least 2-3 times per year. She was informed of the risk of over-replacement of Vitamin D and agrees to not increase her dose unless she discusses this with [place] first. Audrey Duncan agrees to follow-up with our clinic in 3 weeks.  Pre-Diabetes Audrey Duncan will continue to work on weight loss, exercise, and decreasing simple carbohydrates in her diet to help decrease the risk of diabetes. We dicussed metformin including benefits and risks. She was informed that eating too many simple carbohydrates or too many calories at one sitting increases the likelihood of GI side effects. Audrey Duncan was given a refill on her metformin #30 with 2 refills and agrees to follow-up with our clinic in 3 weeks.  Obesity Audrey Duncan is currently in the action stage of change. As such, her goal is to continue with weight loss efforts. She has agreed to keep a food journal with 1400-1500 calories and 90 grams of protein. Audrey Duncan has been instructed to work up to a goal of 150 minutes of combined cardio  and strengthening exercise per week for weight loss and overall health benefits. We discussed the following Behavioral Modification Strategies today: work on meal planning and easy cooking plans, and travel eating strategies.  Audrey Duncan has agreed to follow-up with our clinic in 3 weeks. She was informed of the importance of frequent follow-up visits to maximize her success with intensive lifestyle modifications for her multiple health conditions.  ALLERGIES: No Known Allergies  MEDICATIONS: Current Outpatient Medications on File Prior to Visit  Medication Sig Dispense Refill   Magnesium 250 MG TABS Take 1 tablet by mouth daily.     polyethylene glycol powder (GLYCOLAX/MIRALAX) powder Take 17 g by mouth daily. 3350 g 0   Probiotic Product (ALIGN PO) Take 1 capsule by mouth daily.     Current Facility-Administered Medications on File Prior to Visit  Medication Dose Route Frequency Provider Last Rate Last Dose   0.9 %  sodium chloride infusion  500 mL Intravenous Continuous Danis, Kirke Corin, MD        PAST MEDICAL HISTORY: Past Medical History:  Diagnosis Date   Asthma    Chiari  malformation type II ([place])    diagnosed when in high school   Chiari malformation type II ([place])    Colon polyp 2006   (TUBULAR ADENOMA)--COLONOSCOPY-DR. HAYES   Constipation    Fluid retention    H/O seasonal allergies    Medial meniscus tear    left   Over weight    Ulcerative colitis ([place])     PAST SURGICAL HISTORY: Past Surgical History:  Procedure Laterality Date   ABDOMINAL HYSTERECTOMY  1999   complete   CESAREAN SECTION  1995 and [place]  [id number]   umbilical    SOCIAL HISTORY: Social History   Tobacco Use   Smoking status: Never Smoker   Smokeless tobacco: Never Used  Substance Use Topics   Alcohol use: Yes    Comment: 1 glass of wine 1-2 x a month   Drug use: No    FAMILY HISTORY: Family History  Problem Relation Age of Onset   Heart disease  Mother 78       2 MIs    Stroke Mother 79   Hypertension Mother    Cancer Mother        lung, lymphoma   Lung cancer Mother        was a smoker   Kidney disease Mother    Obesity Mother    COPD Father        smoker   Cancer Father        colon, lung prostate   Alcohol abuse Father    Hypertension Father    Obesity Father    Asthma Sister    Alcohol abuse Brother    Diabetes Paternal Uncle    Breast cancer Neg Hx    ROS: Review of Systems  Cardiovascular: Negative for chest pain.  Gastrointestinal: Negative for diarrhea, nausea and vomiting.  Musculoskeletal:       Negative for muscle weakness.  Endo/Heme/Allergies:       Negative for polyphagia.  Psychiatric/Behavioral: Positive for depression (emotional eating). Negative for suicidal ideas.       Negative for homicidal ideas.   PHYSICAL EXAM: Blood pressure 127/84, pulse 75, temperature 97.9 F (36.6 C), temperature source Oral, height 5\' 4"  (1.626 m), weight 271 lb (122.9 kg), SpO2 97 %. Body mass index is 46.52 kg/m. Physical Exam Vitals signs reviewed.  Constitutional:      Appearance: Normal appearance. She is obese.  Cardiovascular:     Rate and Rhythm: Normal rate.     Pulses: Normal pulses.  Pulmonary:     Effort: Pulmonary effort is normal.     Breath sounds: Normal breath sounds.  Musculoskeletal: Normal range of motion.  Skin:    General: Skin is warm and dry.  Neurological:     Mental Status: She is alert and oriented to person, place, and time.  Psychiatric:        Behavior: Behavior normal.   RECENT LABS AND TESTS: BMET    Component Value Date/Time   NA 139 01/16/2019 1500   NA 142 06/05/2018 0902   K 4.0 01/16/2019 1500   CL 102 01/16/2019 1500   CO2 31 01/16/2019 1500   GLUCOSE 87 01/16/2019 1500   GLUCOSE 95 05/11/2006 1148   BUN 20 01/16/2019 1500   BUN 18 06/05/2018 0902   CREATININE 0.85 01/16/2019 1500   CREATININE 0.96 05/12/2016 1612   CALCIUM 9.2 01/16/2019  1500   GFRNONAA 67 06/05/2018 0902   GFRAA 77 06/05/2018 0902  Lab Results  Component Value Date   HGBA1C 5.3 02/04/2019   HGBA1C 5.4 06/05/2018   HGBA1C 5.3 09/10/2017   Lab Results  Component Value Date   INSULIN 26.2 (H) 02/04/2019   INSULIN 37.8 (H) 06/05/2018   INSULIN 27.9 (H) 02/04/2018   INSULIN 39.9 (H) 09/10/2017   CBC    Component Value Date/Time   WBC 7.6 01/16/2019 1500   RBC 4.37 01/16/2019 1500   HGB 13.7 01/16/2019 1500   HGB 14.5 09/10/2017 0942   HCT 41.0 01/16/2019 1500   HCT 44.2 09/10/2017 0942   PLT 301.0 01/16/2019 1500   MCV 93.8 01/16/2019 1500   MCV 96 09/10/2017 0942   MCH 31.5 09/10/2017 0942   MCH 30.7 05/12/2016 1612   MCHC 33.4 01/16/2019 1500   RDW 12.7 01/16/2019 1500   RDW 13.4 09/10/2017 0942   LYMPHSABS 1.7 09/10/2017 0942   MONOABS 696 05/12/2016 1612   EOSABS 0.2 09/10/2017 0942   BASOSABS 0.1 09/10/2017 0942   Iron/TIBC/Ferritin/ %Sat No results found for: IRON, TIBC, FERRITIN, IRONPCTSAT Lipid Panel     Component Value Date/Time   CHOL 146 06/05/2018 0902   TRIG 87 06/05/2018 0902   HDL 52 06/05/2018 0902   CHOLHDL 3 06/11/2017 0950   VLDL 14.8 06/11/2017 0950   LDLCALC 77 06/05/2018 0902   Hepatic Function Panel     Component Value Date/Time   PROT 7.1 01/16/2019 1500   PROT 6.8 06/05/2018 0902   ALBUMIN 4.2 01/16/2019 1500   ALBUMIN 4.4 06/05/2018 0902   AST 22 01/16/2019 1500   ALT 25 01/16/2019 1500   ALKPHOS 94 01/16/2019 1500   BILITOT 0.8 01/16/2019 1500   BILITOT 0.6 06/05/2018 0902   BILIDIR 0.1 06/12/2014 1359      Component Value Date/Time   TSH 2.180 09/10/2017 0942   TSH 2.62 05/12/2016 1612   TSH 1.87 06/12/2014 1359   Results for Bink, Shelanda R (MRN [id number]) as of 03/19/2019 10:48  Ref. Range 02/04/2019 09:03  Vitamin D, 25-Hydroxy Latest Ref Range: 30.0 - 100.0 ng/mL 42.2   OBESITY BEHAVIORAL INTERVENTION VISIT  Today's visit was #23  Starting weight: 279 lbs Starting date:  09/10/2017 Today's weight: 271 lbs  Today's date: 03/19/2019 Total lbs lost to date: 8    03/19/2019  Height 5\' 4"  (1.626 m)  Weight 271 lb (122.9 kg)  BMI (Calculated) 46.49  BLOOD PRESSURE - SYSTOLIC [id number]  BLOOD PRESSURE - DIASTOLIC 84   Body Fat % [id number] %  Total Body Water (lbs) 91.6 lbs   ASK: We discussed the diagnosis of obesity with Audrey Duncan today and Audrey Duncan agreed to give [place] permission to discuss obesity behavioral modification therapy today.  ASSESS: Audrey Duncan has the diagnosis of obesity and her BMI today is 46.6. Audrey Duncan is in the action stage of change.   ADVISE: Audrey Duncan was educated on the multiple health risks of obesity as well as the benefit of weight loss to improve her health. She was advised of the need for long term treatment and the importance of lifestyle modifications to improve her current health and to decrease her risk of future health problems.  AGREE: Multiple dietary modification options and treatment options were discussed and  Desirre agreed to follow the recommendations documented in the above note.  ARRANGE: Jessiemae was educated on the importance of frequent visits to treat obesity as outlined per CMS and USPSTF guidelines and agreed to schedule her next follow up appointment today.  Migdalia Dk, am  acting as transcriptionist for Abby Potash, PA-C I, Abby Potash, PA-C have reviewed above note and agree with its content

## 2019-04-05 ENCOUNTER — Encounter (INDEPENDENT_AMBULATORY_CARE_PROVIDER_SITE_OTHER): Payer: Self-pay | Admitting: Physician Assistant

## 2019-04-09 ENCOUNTER — Ambulatory Visit (INDEPENDENT_AMBULATORY_CARE_PROVIDER_SITE_OTHER): Payer: 59 | Admitting: Physician Assistant

## 2019-04-10 DIAGNOSIS — K430 Incisional hernia with obstruction, without gangrene: Secondary | ICD-10-CM | POA: Diagnosis not present

## 2019-04-18 DIAGNOSIS — Z23 Encounter for immunization: Secondary | ICD-10-CM | POA: Diagnosis not present

## 2019-04-23 ENCOUNTER — Other Ambulatory Visit: Payer: Self-pay | Admitting: Family Medicine

## 2019-04-23 DIAGNOSIS — Z1231 Encounter for screening mammogram for malignant neoplasm of breast: Secondary | ICD-10-CM

## 2019-05-17 ENCOUNTER — Other Ambulatory Visit (INDEPENDENT_AMBULATORY_CARE_PROVIDER_SITE_OTHER): Payer: Self-pay | Admitting: Physician Assistant

## 2019-05-17 DIAGNOSIS — I10 Essential (primary) hypertension: Secondary | ICD-10-CM

## 2019-06-02 ENCOUNTER — Encounter (INDEPENDENT_AMBULATORY_CARE_PROVIDER_SITE_OTHER): Payer: Self-pay | Admitting: Physician Assistant

## 2019-06-09 ENCOUNTER — Encounter (INDEPENDENT_AMBULATORY_CARE_PROVIDER_SITE_OTHER): Payer: Self-pay | Admitting: Physician Assistant

## 2019-06-10 ENCOUNTER — Encounter (INDEPENDENT_AMBULATORY_CARE_PROVIDER_SITE_OTHER): Payer: Self-pay | Admitting: Physician Assistant

## 2019-06-10 ENCOUNTER — Other Ambulatory Visit: Payer: Self-pay

## 2019-06-10 ENCOUNTER — Telehealth (INDEPENDENT_AMBULATORY_CARE_PROVIDER_SITE_OTHER): Payer: 59 | Admitting: Physician Assistant

## 2019-06-10 DIAGNOSIS — F3289 Other specified depressive episodes: Secondary | ICD-10-CM

## 2019-06-10 DIAGNOSIS — I10 Essential (primary) hypertension: Secondary | ICD-10-CM

## 2019-06-10 DIAGNOSIS — E559 Vitamin D deficiency, unspecified: Secondary | ICD-10-CM | POA: Diagnosis not present

## 2019-06-10 DIAGNOSIS — E88819 Insulin resistance, unspecified: Secondary | ICD-10-CM

## 2019-06-10 DIAGNOSIS — R69 Illness, unspecified: Secondary | ICD-10-CM | POA: Diagnosis not present

## 2019-06-10 DIAGNOSIS — E8881 Metabolic syndrome: Secondary | ICD-10-CM | POA: Diagnosis not present

## 2019-06-10 DIAGNOSIS — Z6841 Body Mass Index (BMI) 40.0 and over, adult: Secondary | ICD-10-CM | POA: Diagnosis not present

## 2019-06-10 MED ORDER — HYDROCHLOROTHIAZIDE 25 MG PO TABS: 25.0000 mg | ORAL_TABLET | Freq: Every day | ORAL | 1 refills | Status: DC

## 2019-06-10 MED ORDER — BUPROPION HCL ER (SR) 150 MG PO TB12: 150.0000 mg | ORAL_TABLET | Freq: Every day | ORAL | 0 refills | Status: DC

## 2019-06-10 MED ORDER — METFORMIN HCL 500 MG PO TABS: 500.0000 mg | ORAL_TABLET | Freq: Every day | ORAL | 0 refills | Status: DC

## 2019-06-10 MED ORDER — POTASSIUM CHLORIDE CRYS ER 20 MEQ PO TBCR: EXTENDED_RELEASE_TABLET | ORAL | 1 refills | Status: DC

## 2019-06-10 MED ORDER — VITAMIN D (ERGOCALCIFEROL) 1.25 MG (50000 UNIT) PO CAPS: 50000.0000 [IU] | ORAL_CAPSULE | ORAL | 0 refills | Status: DC

## 2019-06-11 ENCOUNTER — Ambulatory Visit
Admission: RE | Admit: 2019-06-11 | Discharge: 2019-06-11 | Disposition: A | Discharge status: Home / Self Care | Payer: 59 | Source: Ambulatory Visit | Attending: Family Medicine | Admitting: Family Medicine

## 2019-06-11 ENCOUNTER — Other Ambulatory Visit: Payer: Self-pay

## 2019-06-11 DIAGNOSIS — Z1231 Encounter for screening mammogram for malignant neoplasm of breast: Secondary | ICD-10-CM

## 2019-06-11 NOTE — Progress Notes (Signed)
Office: [phone]  /  Fax: [phone] TeleHealth Visit:  Audrey Duncan has verbally consented to this TeleHealth visit today. The patient is located at home, the provider is located at the [facility] office. The participants in this visit include the listed provider and patient and any and all parties involved. The visit was conducted today via FaceTime.  HPI:   Chief Complaint: OBESITY Audrey Duncan is here to discuss her progress with her obesity treatment plan. She is on the Category 3 plan and is following her eating plan approximately 85 % of the time. She states she is walking some and doing weights. Audrey Duncan reports that due to COVID stress, she has not been completely on the plan. She struggles to get all of her protein in at dinner. We were unable to weigh the patient today for this TeleHealth visit. She feels as if she has maintained weight since her last visit. She has gained 4 lbs since starting treatment with [place].  Hypertension Audrey Duncan is a 58 y.o. female with hypertension. She is on HCTZ and potassium. Audrey Duncan denies chest pain or headache. She is working weight loss to help control her blood pressure with the goal of decreasing her risk of heart attack and stroke. Audrey Duncan blood pressure is currently controlled.  Vitamin D deficiency Audrey Duncan has a diagnosis of vitamin D deficiency. Audrey Duncan is currently taking vit D and she denies nausea, vomiting or muscle weakness.  Insulin Resistance Audrey Duncan has a diagnosis of insulin resistance based on her elevated fasting insulin level >5. Although Audrey Duncan's blood glucose readings are still under good control, insulin resistance puts her at greater risk of metabolic syndrome and diabetes. Audrey Duncan is on metformin and she denies nausea, vomiting, diarrhea or polyphagia. She continues to work on diet and exercise to decrease risk of diabetes.  Depression with emotional eating  behaviors Audrey Duncan struggles with emotional eating and using food for comfort to the extent that it is negatively impacting her health. She often snacks when she is not hungry. Audrey Duncan sometimes feels she is out of control and then feels guilty that she made poor food choices. She has been working on behavior modification techniques to help reduce her emotional eating and has been somewhat successful. Audrey Duncan has no cravings currently. She shows no sign of suicidal or homicidal ideations.  ASSESSMENT AND PLAN:  Essential hypertension - Plan: hydrochlorothiazide (HYDRODIURIL) 25 MG tablet, potassium chloride SA (KLOR-CON) 20 MEQ tablet  Vitamin D deficiency - Plan: Vitamin D, Ergocalciferol, (DRISDOL) 1.25 MG (50000 UT) CAPS capsule  Insulin resistance - Plan: metFORMIN (GLUCOPHAGE) 500 MG tablet  Other depression,with emotional eating  - Plan: buPROPion (WELLBUTRIN SR) 150 MG 12 hr tablet  Class 3 severe obesity with serious comorbidity and body mass index (BMI) of 45.0 to 49.9 in adult, unspecified obesity type ([facility])  PLAN:  Hypertension We discussed sodium restriction, working on healthy weight loss, and a regular exercise program as the means to achieve improved blood pressure control. Audrey Duncan agreed with this plan and agreed to follow up as directed. We will continue to monitor her blood pressure as well as her progress with the above lifestyle modifications. She agrees to continue HCTZ 25 mg daily #30 with 1 refill and potassium chloride SA (Klor-Con) daily #30 with 1 refill and she will watch for signs of hypotension as she continues her lifestyle modifications.  Vitamin D Deficiency Audrey Duncan was informed that low vitamin D levels contributes to fatigue and are associated with  obesity, breast, and colon cancer. Audrey Duncan agrees to continue to take prescription Vit D @50 ,000 IU every week #4 with no refills and she will follow up for routine testing of vitamin D, at least 2-3 times per  year. She was informed of the risk of over-replacement of vitamin D and agrees to not increase her dose unless she discusses this with [place] first. Audrey Duncan agrees to follow up with our clinic in 2 weeks.  Insulin Resistance Audrey Duncan will continue to work on weight loss, exercise, and decreasing simple carbohydrates in her diet to help decrease the risk of diabetes. She was informed that eating too many simple carbohydrates or too many calories at one sitting increases the likelihood of GI side effects. Audrey Duncan agrees to continue metformin 500 mg daily with breakfast #30 with no refills and follow up with [place] as directed to monitor her progress.  Emotional Eating Behaviors (other depression) We discussed behavior modification techniques today to help Audrey Duncan deal with her emotional eating behaviors. She has agreed to continue Wellbutrin SR 150 mg daily #30 with no refills and follow up as directed.  Obesity Audrey Duncan is currently in the action stage of change. As such, her goal is to continue with weight loss efforts She has agreed to follow the Category 3 plan Audrey Duncan has been instructed to work up to a goal of 150 minutes of combined cardio and strengthening exercise per week for weight loss and overall health benefits. We discussed the following Behavioral Modification Strategies today: increasing lean protein intake and no skipping meals  Audrey Duncan has agreed to follow up with our clinic in 2 weeks. She was informed of the importance of frequent follow up visits to maximize her success with intensive lifestyle modifications for her multiple health conditions.  ALLERGIES: No Known Allergies  MEDICATIONS: Current Outpatient Medications on File Prior to Visit  Medication Sig Dispense Refill   Magnesium 250 MG TABS Take 1 tablet by mouth daily.     polyethylene glycol powder (GLYCOLAX/MIRALAX) powder Take 17 g by mouth daily. 3350 g 0   Probiotic Product (ALIGN PO) Take 1 capsule by mouth  daily.     Current Facility-Administered Medications on File Prior to Visit  Medication Dose Route Frequency Provider Last Rate Last Dose   0.9 %  sodium chloride infusion  500 mL Intravenous Continuous Danis, Kirke Corin, MD        PAST MEDICAL HISTORY: Past Medical History:  Diagnosis Date   Asthma    Chiari malformation type II ([place])    diagnosed when in high school   Chiari malformation type II ([place])    Colon polyp 2006   (TUBULAR ADENOMA)--COLONOSCOPY-DR. HAYES   Constipation    Fluid retention    H/O seasonal allergies    Medial meniscus tear    left   Over weight    Ulcerative colitis ([place])     PAST SURGICAL HISTORY: Past Surgical History:  Procedure Laterality Date   ABDOMINAL HYSTERECTOMY  1999   complete   CESAREAN SECTION  1995 and 1998   HERNIA REPAIR  [id number]   umbilical    SOCIAL HISTORY: Social History   Tobacco Use   Smoking status: Never Smoker   Smokeless tobacco: Never Used  Substance Use Topics   Alcohol use: Yes    Comment: 1 glass of wine 1-2 x a month   Drug use: No    FAMILY HISTORY: Family History  Problem Relation Age of Onset   Heart disease Mother 15  2 MIs    Stroke Mother 47   Hypertension Mother    Cancer Mother        lung, lymphoma   Lung cancer Mother        was a smoker   Kidney disease Mother    Obesity Mother    COPD Father        smoker   Cancer Father        colon, lung prostate   Alcohol abuse Father    Hypertension Father    Obesity Father    Asthma Sister    Alcohol abuse Brother    Diabetes Paternal Uncle    Breast cancer Neg Hx     ROS: Review of Systems  Constitutional: Negative for weight loss.  Cardiovascular: Negative for chest pain.  Gastrointestinal: Negative for diarrhea, nausea and vomiting.  Musculoskeletal:       Negative for muscle weakness  Neurological: Negative for headaches.  Endo/Heme/Allergies:       Negative for cravings Negative for  polyphagia  Psychiatric/Behavioral: Positive for depression. Negative for suicidal ideas.    PHYSICAL EXAM: Pt in no acute distress  RECENT LABS AND TESTS: BMET    Component Value Date/Time   NA 139 01/16/2019 1500   NA 142 06/05/2018 0902   K 4.0 01/16/2019 1500   CL 102 01/16/2019 1500   CO2 31 01/16/2019 1500   GLUCOSE 87 01/16/2019 1500   GLUCOSE 95 05/11/2006 1148   BUN 20 01/16/2019 1500   BUN 18 06/05/2018 0902   CREATININE 0.85 01/16/2019 1500   CREATININE 0.96 05/12/2016 1612   CALCIUM 9.2 01/16/2019 1500   GFRNONAA 67 06/05/2018 0902   GFRAA 77 06/05/2018 0902   Lab Results  Component Value Date   HGBA1C 5.3 02/04/2019   HGBA1C 5.4 06/05/2018   HGBA1C 5.3 09/10/2017   Lab Results  Component Value Date   INSULIN 26.2 (H) 02/04/2019   INSULIN 37.8 (H) 06/05/2018   INSULIN 27.9 (H) 02/04/2018   INSULIN 39.9 (H) 09/10/2017   CBC    Component Value Date/Time   WBC 7.6 01/16/2019 1500   RBC 4.37 01/16/2019 1500   HGB 13.7 01/16/2019 1500   HGB 14.5 09/10/2017 0942   HCT 41.0 01/16/2019 1500   HCT 44.2 09/10/2017 0942   PLT 301.0 01/16/2019 1500   MCV 93.8 01/16/2019 1500   MCV 96 09/10/2017 0942   MCH 31.5 09/10/2017 0942   MCH 30.7 05/12/2016 1612   MCHC 33.4 01/16/2019 1500   RDW 12.7 01/16/2019 1500   RDW 13.4 09/10/2017 0942   LYMPHSABS 1.7 09/10/2017 0942   MONOABS 696 05/12/2016 1612   EOSABS 0.2 09/10/2017 0942   BASOSABS 0.1 09/10/2017 0942   Iron/TIBC/Ferritin/ %Sat No results found for: IRON, TIBC, FERRITIN, IRONPCTSAT Lipid Panel     Component Value Date/Time   CHOL 146 06/05/2018 0902   TRIG 87 06/05/2018 0902   HDL 52 06/05/2018 0902   CHOLHDL 3 06/11/2017 0950   VLDL 14.8 06/11/2017 0950   LDLCALC 77 06/05/2018 0902   Hepatic Function Panel     Component Value Date/Time   PROT 7.1 01/16/2019 1500   PROT 6.8 06/05/2018 0902   ALBUMIN 4.2 01/16/2019 1500   ALBUMIN 4.4 06/05/2018 0902   AST 22 01/16/2019 1500   ALT 25  01/16/2019 1500   ALKPHOS 94 01/16/2019 1500   BILITOT 0.8 01/16/2019 1500   BILITOT 0.6 06/05/2018 0902   BILIDIR 0.1 06/12/2014 1359      Component  Value Date/Time   TSH 2.180 09/10/2017 0942   TSH 2.62 05/12/2016 1612   TSH 1.87 06/12/2014 1359     Ref. Range 02/04/2019 09:03  Vitamin D, 25-Hydroxy Latest Ref Range: 30.0 - 100.0 ng/mL 42.2    I, Doreene Nest, am acting as Location manager for Abby Potash, PA-C I, Abby Potash, PA-C have reviewed above note and agree with its content

## 2019-06-23 ENCOUNTER — Encounter: Payer: Self-pay | Admitting: Family Medicine

## 2019-06-23 NOTE — Telephone Encounter (Signed)
Yes ---  She is at risk for chicken pox-- she should probably get the shingrix vaccine

## 2019-06-26 ENCOUNTER — Ambulatory Visit (INDEPENDENT_AMBULATORY_CARE_PROVIDER_SITE_OTHER): Payer: 59 | Admitting: Physician Assistant

## 2019-06-28 ENCOUNTER — Other Ambulatory Visit (INDEPENDENT_AMBULATORY_CARE_PROVIDER_SITE_OTHER): Payer: Self-pay | Admitting: Physician Assistant

## 2019-06-28 DIAGNOSIS — E559 Vitamin D deficiency, unspecified: Secondary | ICD-10-CM

## 2019-07-20 ENCOUNTER — Other Ambulatory Visit (INDEPENDENT_AMBULATORY_CARE_PROVIDER_SITE_OTHER): Payer: Self-pay | Admitting: Physician Assistant

## 2019-07-20 DIAGNOSIS — E559 Vitamin D deficiency, unspecified: Secondary | ICD-10-CM

## 2019-07-26 ENCOUNTER — Other Ambulatory Visit (INDEPENDENT_AMBULATORY_CARE_PROVIDER_SITE_OTHER): Payer: Self-pay | Admitting: Physician Assistant

## 2019-07-26 DIAGNOSIS — F3289 Other specified depressive episodes: Secondary | ICD-10-CM

## 2019-07-27 ENCOUNTER — Other Ambulatory Visit (INDEPENDENT_AMBULATORY_CARE_PROVIDER_SITE_OTHER): Payer: Self-pay | Admitting: Physician Assistant

## 2019-07-27 DIAGNOSIS — I10 Essential (primary) hypertension: Secondary | ICD-10-CM

## 2019-08-10 ENCOUNTER — Encounter (INDEPENDENT_AMBULATORY_CARE_PROVIDER_SITE_OTHER): Payer: Self-pay | Admitting: Physician Assistant

## 2019-08-12 ENCOUNTER — Other Ambulatory Visit (INDEPENDENT_AMBULATORY_CARE_PROVIDER_SITE_OTHER): Payer: Self-pay

## 2019-08-12 ENCOUNTER — Encounter (INDEPENDENT_AMBULATORY_CARE_PROVIDER_SITE_OTHER): Payer: Self-pay | Admitting: Physician Assistant

## 2019-08-12 DIAGNOSIS — E559 Vitamin D deficiency, unspecified: Secondary | ICD-10-CM

## 2019-08-12 DIAGNOSIS — I10 Essential (primary) hypertension: Secondary | ICD-10-CM

## 2019-08-12 MED ORDER — VITAMIN D (ERGOCALCIFEROL) 1.25 MG (50000 UNIT) PO CAPS: 50000.0000 [IU] | ORAL_CAPSULE | ORAL | 0 refills | Status: DC

## 2019-08-12 MED ORDER — POTASSIUM CHLORIDE CRYS ER 20 MEQ PO TBCR: EXTENDED_RELEASE_TABLET | ORAL | 0 refills | Status: DC

## 2019-08-12 MED ORDER — HYDROCHLOROTHIAZIDE 25 MG PO TABS: 25.0000 mg | ORAL_TABLET | Freq: Every day | ORAL | 0 refills | Status: DC

## 2019-08-12 NOTE — Telephone Encounter (Signed)
Please advise 

## 2019-08-23 ENCOUNTER — Other Ambulatory Visit (INDEPENDENT_AMBULATORY_CARE_PROVIDER_SITE_OTHER): Payer: Self-pay | Admitting: Physician Assistant

## 2019-08-23 DIAGNOSIS — E8881 Metabolic syndrome: Secondary | ICD-10-CM

## 2019-08-26 ENCOUNTER — Encounter (INDEPENDENT_AMBULATORY_CARE_PROVIDER_SITE_OTHER): Payer: Self-pay | Admitting: Physician Assistant

## 2019-08-27 ENCOUNTER — Encounter (INDEPENDENT_AMBULATORY_CARE_PROVIDER_SITE_OTHER): Payer: Self-pay | Admitting: Physician Assistant

## 2019-08-27 ENCOUNTER — Encounter (INDEPENDENT_AMBULATORY_CARE_PROVIDER_SITE_OTHER): Payer: Self-pay

## 2019-08-28 ENCOUNTER — Telehealth (INDEPENDENT_AMBULATORY_CARE_PROVIDER_SITE_OTHER): Payer: 59 | Admitting: Physician Assistant

## 2019-08-28 ENCOUNTER — Encounter (INDEPENDENT_AMBULATORY_CARE_PROVIDER_SITE_OTHER): Payer: Self-pay

## 2019-08-28 ENCOUNTER — Other Ambulatory Visit (INDEPENDENT_AMBULATORY_CARE_PROVIDER_SITE_OTHER): Payer: Self-pay

## 2019-08-28 ENCOUNTER — Encounter (INDEPENDENT_AMBULATORY_CARE_PROVIDER_SITE_OTHER): Payer: Self-pay | Admitting: Physician Assistant

## 2019-08-28 DIAGNOSIS — F3289 Other specified depressive episodes: Secondary | ICD-10-CM

## 2019-08-28 DIAGNOSIS — I10 Essential (primary) hypertension: Secondary | ICD-10-CM

## 2019-08-28 MED ORDER — HYDROCHLOROTHIAZIDE 25 MG PO TABS: 25.0000 mg | ORAL_TABLET | Freq: Every day | ORAL | 0 refills | Status: DC

## 2019-08-28 MED ORDER — BUPROPION HCL ER (SR) 150 MG PO TB12: 150.0000 mg | ORAL_TABLET | Freq: Every day | ORAL | 0 refills | Status: DC

## 2019-08-28 MED ORDER — POTASSIUM CHLORIDE CRYS ER 20 MEQ PO TBCR: EXTENDED_RELEASE_TABLET | ORAL | 0 refills | Status: DC

## 2019-08-28 NOTE — Telephone Encounter (Signed)
Spoke with the patient and scheduled her for March 4th fasting appt with Dr Juleen China and refilled her Wellbutrin, HCTZ and Klor Con.   Audrey Duncan, [place]

## 2019-09-11 ENCOUNTER — Ambulatory Visit (INDEPENDENT_AMBULATORY_CARE_PROVIDER_SITE_OTHER): Payer: 59 | Admitting: Family Medicine

## 2019-09-11 ENCOUNTER — Encounter (INDEPENDENT_AMBULATORY_CARE_PROVIDER_SITE_OTHER): Payer: Self-pay | Admitting: Family Medicine

## 2019-09-11 ENCOUNTER — Other Ambulatory Visit: Payer: Self-pay

## 2019-09-11 VITALS — BP 125/86 | HR 86 | Temp 97.7°F | Ht 64.0 in | Wt 275.0 lb

## 2019-09-11 DIAGNOSIS — E66813 Obesity, class 3: Secondary | ICD-10-CM

## 2019-09-11 DIAGNOSIS — E559 Vitamin D deficiency, unspecified: Secondary | ICD-10-CM | POA: Diagnosis not present

## 2019-09-11 DIAGNOSIS — I10 Essential (primary) hypertension: Secondary | ICD-10-CM | POA: Diagnosis not present

## 2019-09-11 DIAGNOSIS — F3289 Other specified depressive episodes: Secondary | ICD-10-CM | POA: Diagnosis not present

## 2019-09-11 DIAGNOSIS — Z9189 Other specified personal risk factors, not elsewhere classified: Secondary | ICD-10-CM

## 2019-09-11 DIAGNOSIS — E8881 Metabolic syndrome: Secondary | ICD-10-CM

## 2019-09-11 DIAGNOSIS — E88819 Insulin resistance, unspecified: Secondary | ICD-10-CM

## 2019-09-11 DIAGNOSIS — Z6841 Body Mass Index (BMI) 40.0 and over, adult: Secondary | ICD-10-CM

## 2019-09-11 MED ORDER — METFORMIN HCL 500 MG PO TABS: 500.0000 mg | ORAL_TABLET | Freq: Every day | ORAL | 0 refills | Status: DC

## 2019-09-11 MED ORDER — HYDROCHLOROTHIAZIDE 25 MG PO TABS: 25.0000 mg | ORAL_TABLET | Freq: Every day | ORAL | 0 refills | Status: DC

## 2019-09-11 MED ORDER — POTASSIUM CHLORIDE CRYS ER 20 MEQ PO TBCR: EXTENDED_RELEASE_TABLET | ORAL | 0 refills | Status: DC

## 2019-09-11 MED ORDER — VITAMIN D (ERGOCALCIFEROL) 1.25 MG (50000 UNIT) PO CAPS: 50000.0000 [IU] | ORAL_CAPSULE | ORAL | 0 refills | Status: DC

## 2019-09-11 MED ORDER — BUPROPION HCL ER (SR) 150 MG PO TB12: 150.0000 mg | ORAL_TABLET | Freq: Every day | ORAL | 0 refills | Status: DC

## 2019-09-11 NOTE — Progress Notes (Signed)
Chief Complaint:   OBESITY Alona is here to discuss her progress with her obesity treatment plan along with follow-up of her obesity related diagnoses. Shaquay is on the Category 3 Plan and states she is following her eating plan approximately 60% of the time. Makendra states she is exercising for 0 minutes 0 times per week.  Today's visit was #: 25 Starting weight: 279 lbs Starting date: 09/10/2017 Today's weight: 275 lbs Today's date: 09/11/2019 Total lbs lost to date: 4 lbs Total lbs lost since last in-office visit: 0  Interim History: Amma has not been doing any walking.  She reports working 70 hours a week.  She works for [employer].  Dannah provided the following food recall:  Breakfast:  Skips Lunch:  Skips Dinner:  Varies Snack:  Apple  Subjective:   1. Essential hypertension Review: taking medications as instructed, no medication side effects noted, no chest pain on exertion, no dyspnea on exertion, no swelling of ankles.  Saria takes HCTZ and potassium.   BP Readings from Last 3 Encounters:  09/11/19 125/86  03/19/19 127/84  02/04/19 124/85   2. Vitamin D deficiency Miski's Vitamin D level was 42.2 on 02/04/2019. She is currently taking vit D. She denies nausea, vomiting or muscle weakness.  3. Insulin resistance Empryss has a diagnosis of insulin resistance based on her elevated fasting insulin level >5. She continues to work on diet and exercise to decrease her risk of diabetes.  She takes metformin 500 mg daily.  Lab Results  Component Value Date   INSULIN 26.2 (H) 02/04/2019   INSULIN 37.8 (H) 06/05/2018   INSULIN 27.9 (H) 02/04/2018   INSULIN 39.9 (H) 09/10/2017   Lab Results  Component Value Date   HGBA1C 5.3 02/04/2019   4. Other depression,with emotional eating  Vara is struggling with emotional eating and using food for comfort to the extent that it is negatively impacting her health. She has been working on behavior  modification techniques to help reduce her emotional eating and has been unsuccessful. She shows no sign of suicidal or homicidal ideations.  She is taking Wellbutrin 150 mg daily.  5. At risk for heart disease Eliannie is at a higher than average risk for cardiovascular disease due to obesity. Reviewed: no chest pain on exertion, no dyspnea on exertion, and no swelling of ankles.  Assessment/Plan:   1. Essential hypertension Lizbette is working on healthy weight loss and exercise to improve blood pressure control. We will watch for signs of hypotension as she continues her lifestyle modifications.  Orders - CBC with Differential/Platelet - Lipid panel - TSH - T4, free - T3 - Anemia panel - potassium chloride SA (KLOR-CON) 20 MEQ tablet; TAKE 1 TABLET(20 MEQ) BY MOUTH DAILY  Dispense: 30 tablet; Refill: 0 - hydrochlorothiazide (HYDRODIURIL) 25 MG tablet; Take 1 tablet (25 mg total) by mouth daily.  Dispense: 30 tablet; Refill: 0  2. Vitamin D deficiency Low Vitamin D level contributes to fatigue and are associated with obesity, breast, and colon cancer. She agrees to continue to take prescription Vitamin D @50 ,000 IU every week and will follow-up for routine testing of Vitamin D, at least 2-3 times per year to avoid over-replacement.  Orders - VITAMIN D 25 Hydroxy (Vit-D Deficiency, Fractures) - Vitamin D, Ergocalciferol, (DRISDOL) 1.25 MG (50000 UNIT) CAPS capsule; Take 1 capsule (50,000 Units total) by mouth every 7 (seven) days.  Dispense: 4 capsule; Refill: 0  3. Insulin resistance Ifeoluwa will continue to work on [employer]  loss, exercise, and decreasing simple carbohydrates to help decrease the risk of diabetes. Danese agreed to follow-up with [place] as directed to closely monitor her progress.  Orders - Comprehensive metabolic panel - Hemoglobin A1c - metFORMIN (GLUCOPHAGE) 500 MG tablet; Take 1 tablet (500 mg total) by mouth daily with breakfast.  Dispense: 30 tablet; Refill:  0  4. Other depression,with emotional eating  Behavior modification techniques were discussed today to help Alexiah deal with her emotional/non-hunger eating behaviors.  Orders and follow up as documented in patient record.   Orders - buPROPion (WELLBUTRIN SR) 150 MG 12 hr tablet; Take 1 tablet (150 mg total) by mouth daily.  Dispense: 30 tablet; Refill: 0  5. At risk for heart disease Markel was given approximately 15 minutes of coronary artery disease prevention counseling today. She is 59 y.o. female and has risk factors for heart disease including obesity. We discussed intensive lifestyle modifications today with an emphasis on specific weight loss instructions and strategies.   Repetitive spaced learning was employed today to elicit superior memory formation and behavioral change.  6. Class 3 severe obesity with serious comorbidity and body mass index (BMI) of 45.0 to 49.9 in adult, unspecified obesity type (HCC) Rakeisha is currently in the action stage of change. As such, her goal is to continue with weight loss efforts. She has agreed to the Category 3 Plan.   Exercise goals: All adults should avoid inactivity. Some physical activity is better than none, and adults who participate in any amount of physical activity gain some health benefits.  Behavioral modification strategies: increasing lean protein intake, decreasing simple carbohydrates, increasing vegetables, increasing water intake and meal planning and cooking strategies.  Robertta has agreed to follow-up with our clinic in 2 weeks. She was informed of the importance of frequent follow-up visits to maximize her success with intensive lifestyle modifications for her multiple health conditions.   Jame was informed we would discuss her lab results at her next visit unless there is a critical issue that needs to be addressed sooner. Malayna agreed to keep her next visit at the agreed upon time to discuss these  results.  Objective:   Blood pressure 125/86, pulse 86, temperature 97.7 F (36.5 C), temperature source Oral, height 5\' 4"  (1.626 m), weight 275 lb (124.7 kg), SpO2 98 %. Body mass index is 47.2 kg/m.  General: Cooperative, alert, well developed, in no acute distress. HEENT: Conjunctivae and lids unremarkable. Cardiovascular: Regular rhythm.  Lungs: Normal work of breathing. Neurologic: No focal deficits.   Lab Results  Component Value Date   CREATININE 0.85 01/16/2019   BUN 20 01/16/2019   NA 139 01/16/2019   K 4.0 01/16/2019   CL 102 01/16/2019   CO2 31 01/16/2019   Lab Results  Component Value Date   ALT 25 01/16/2019   AST 22 01/16/2019   ALKPHOS 94 01/16/2019   BILITOT 0.8 01/16/2019   Lab Results  Component Value Date   HGBA1C 5.3 02/04/2019   HGBA1C 5.4 06/05/2018   HGBA1C 5.3 09/10/2017   Lab Results  Component Value Date   INSULIN 26.2 (H) 02/04/2019   INSULIN 37.8 (H) 06/05/2018   INSULIN 27.9 (H) 02/04/2018   INSULIN 39.9 (H) 09/10/2017   Lab Results  Component Value Date   TSH 2.180 09/10/2017   Lab Results  Component Value Date   CHOL 146 06/05/2018   HDL 52 06/05/2018   LDLCALC 77 06/05/2018   TRIG 87 06/05/2018   CHOLHDL 3 06/11/2017  Lab Results  Component Value Date   WBC 7.6 01/16/2019   HGB 13.7 01/16/2019   HCT 41.0 01/16/2019   MCV 93.8 01/16/2019   PLT 301.0 01/16/2019   Attestation Statements:   Reviewed by clinician on day of visit: allergies, medications, problem list, medical history, surgical history, family history, social history, and previous encounter notes.  I, Water quality scientist, CMA, am acting as Location manager for [employer], DO.  I have reviewed the above documentation for accuracy and completeness, and I agree with the above. Briscoe Deutscher, DO

## 2019-09-12 LAB — ANEMIA PANEL
Ferritin: 258 ng/mL — ABNORMAL HIGH (ref 15–150)
Folate, Hemolysate: 399 ng/mL
Folate, RBC: 907 ng/mL (ref >498)
Hematocrit: 44 % (ref 34.0–46.6)
Iron Saturation: 31 % (ref 15–55)
Iron: 81 ug/dL (ref 27–159)
Retic Ct Pct: 1.8 % (ref 0.6–2.6)
Total Iron Binding Capacity: 264 ug/dL (ref 250–450)
UIBC: 183 ug/dL (ref 131–425)
Vitamin B-12: 431 pg/mL (ref 232–1245)

## 2019-09-12 LAB — CBC WITH DIFFERENTIAL/PLATELET
Basophils Absolute: 0.1 10*3/uL (ref 0.0–0.2)
Basos: 1 %
EOS (ABSOLUTE): 0.2 10*3/uL (ref 0.0–0.4)
Eos: 3 %
Hemoglobin: 14.2 g/dL (ref 11.1–15.9)
Immature Grans (Abs): 0 10*3/uL (ref 0.0–0.1)
Immature Granulocytes: 0 %
Lymphocytes Absolute: 1.7 10*3/uL (ref 0.7–3.1)
Lymphs: 25 %
MCH: 30.7 pg (ref 26.6–33.0)
MCHC: 32.3 g/dL (ref 31.5–35.7)
MCV: 95 fL (ref 79–97)
Monocytes Absolute: 0.4 10*3/uL (ref 0.1–0.9)
Monocytes: 6 %
Neutrophils Absolute: 4.3 10*3/uL (ref 1.4–7.0)
Neutrophils: 65 %
Platelets: 325 10*3/uL (ref 150–450)
RBC: 4.63 x10E6/uL (ref 3.77–5.28)
RDW: 12.5 % (ref 11.7–15.4)
WBC: 6.7 10*3/uL (ref 3.4–10.8)

## 2019-09-12 LAB — LIPID PANEL
Chol/HDL Ratio: 2.8 ratio (ref 0.0–4.4)
Cholesterol, Total: 142 mg/dL (ref 100–199)
HDL: 51 mg/dL (ref >39)
LDL Chol Calc (NIH): 75 mg/dL (ref 0–99)
Triglycerides: 83 mg/dL (ref 0–149)
VLDL Cholesterol Cal: 16 mg/dL (ref 5–40)

## 2019-09-12 LAB — COMPREHENSIVE METABOLIC PANEL
ALT: 29 IU/L (ref 0–32)
AST: 28 IU/L (ref 0–40)
Albumin/Globulin Ratio: 1.7 (ref 1.2–2.2)
Albumin: 4.1 g/dL (ref 3.8–4.9)
Alkaline Phosphatase: 106 IU/L (ref 39–117)
BUN/Creatinine Ratio: 21 (ref 12–28)
BUN: 15 mg/dL (ref 8–27)
Bilirubin Total: 0.7 mg/dL (ref 0.0–1.2)
CO2: 25 mmol/L (ref 20–29)
Calcium: 9.4 mg/dL (ref 8.7–10.3)
Chloride: 104 mmol/L (ref 96–106)
Creatinine, Ser: 0.73 mg/dL (ref 0.57–1.00)
GFR calc Af Amer: 104 mL/min/{1.73_m2} (ref >59)
GFR calc non Af Amer: 90 mL/min/{1.73_m2} (ref >59)
Globulin, Total: 2.4 g/dL (ref 1.5–4.5)
Glucose: 104 mg/dL — ABNORMAL HIGH (ref 65–99)
Potassium: 4 mmol/L (ref 3.5–5.2)
Sodium: 144 mmol/L (ref 134–144)
Total Protein: 6.5 g/dL (ref 6.0–8.5)

## 2019-09-12 LAB — T4, FREE: Free T4: 1.43 ng/dL (ref 0.82–1.77)

## 2019-09-12 LAB — HEMOGLOBIN A1C
Est. average glucose Bld gHb Est-mCnc: 105 mg/dL
Hgb A1c MFr Bld: 5.3 % (ref 4.8–5.6)

## 2019-09-12 LAB — TSH: TSH: 1.75 u[IU]/mL (ref 0.450–4.500)

## 2019-09-12 LAB — T3: T3, Total: 161 ng/dL (ref 71–180)

## 2019-09-12 LAB — VITAMIN D 25 HYDROXY (VIT D DEFICIENCY, FRACTURES): Vit D, 25-Hydroxy: 42.8 ng/mL (ref 30.0–100.0)

## 2019-09-14 ENCOUNTER — Ambulatory Visit: Payer: 59 | Attending: Internal Medicine

## 2019-09-14 DIAGNOSIS — Z23 Encounter for immunization: Secondary | ICD-10-CM | POA: Insufficient documentation

## 2019-09-14 NOTE — Progress Notes (Signed)
   Covid-19 Vaccination Clinic  Name:  Anysha Nordness    MRN: [id number] DOB: 08-Dec-1960  09/14/2019  Ms. Megna was observed post Covid-19 immunization for 15 minutes without incident. She was provided with Vaccine Information Sheet and instruction to access the V-Safe system.   Ms. Lucker was instructed to call 911 with any severe reactions post vaccine: [name] Kitchen Difficulty breathing  . Swelling of face and throat  . A fast heartbeat  . A bad rash all over body  . Dizziness and weakness   Immunizations Administered    Name Date Dose VIS Date Route   Pfizer COVID-19 Vaccine 09/14/2019  2:54 PM 0.3 mL 06/20/2019 Intramuscular   Manufacturer: [place]   Lot: [id number]   [place]: [id number]

## 2019-09-16 ENCOUNTER — Encounter (INDEPENDENT_AMBULATORY_CARE_PROVIDER_SITE_OTHER): Payer: Self-pay

## 2019-10-02 ENCOUNTER — Ambulatory Visit (INDEPENDENT_AMBULATORY_CARE_PROVIDER_SITE_OTHER): Payer: 59 | Admitting: Physician Assistant

## 2019-10-03 ENCOUNTER — Other Ambulatory Visit (INDEPENDENT_AMBULATORY_CARE_PROVIDER_SITE_OTHER): Payer: Self-pay | Admitting: Family Medicine

## 2019-10-03 DIAGNOSIS — E559 Vitamin D deficiency, unspecified: Secondary | ICD-10-CM

## 2019-10-08 ENCOUNTER — Ambulatory Visit: Payer: 59 | Attending: Internal Medicine

## 2019-10-08 DIAGNOSIS — Z23 Encounter for immunization: Secondary | ICD-10-CM

## 2019-10-08 NOTE — Progress Notes (Signed)
   Covid-19 Vaccination Clinic  Name:  Audrey Duncan    MRN: [id number] DOB: 07/05/1961  10/08/2019  Audrey Duncan was observed post Covid-19 immunization for 15 minutes without incident. She was provided with Vaccine Information Sheet and instruction to access the V-Safe system.   Audrey Duncan was instructed to call 911 with any severe reactions post vaccine: [name] Kitchen Difficulty breathing  . Swelling of face and throat  . A fast heartbeat  . A bad rash all over body  . Dizziness and weakness   Immunizations Administered    Name Date Dose VIS Date Route   Pfizer COVID-19 Vaccine 10/08/2019  4:21 PM 0.3 mL 06/20/2019 Intramuscular   Manufacturer: [place]   Lot: [id number]   [place]: [id number]

## 2019-10-28 ENCOUNTER — Encounter (INDEPENDENT_AMBULATORY_CARE_PROVIDER_SITE_OTHER): Payer: Self-pay | Admitting: Family Medicine

## 2019-10-29 ENCOUNTER — Encounter (INDEPENDENT_AMBULATORY_CARE_PROVIDER_SITE_OTHER): Payer: Self-pay | Admitting: Family Medicine

## 2019-10-30 ENCOUNTER — Telehealth (INDEPENDENT_AMBULATORY_CARE_PROVIDER_SITE_OTHER): Payer: Self-pay

## 2019-10-30 ENCOUNTER — Telehealth: Payer: Self-pay | Admitting: Family Medicine

## 2019-10-30 ENCOUNTER — Encounter (INDEPENDENT_AMBULATORY_CARE_PROVIDER_SITE_OTHER): Payer: Self-pay

## 2019-10-30 DIAGNOSIS — I10 Essential (primary) hypertension: Secondary | ICD-10-CM

## 2019-10-30 MED ORDER — POTASSIUM CHLORIDE CRYS ER 20 MEQ PO TBCR: EXTENDED_RELEASE_TABLET | ORAL | 0 refills | Status: DC

## 2019-10-30 MED ORDER — HYDROCHLOROTHIAZIDE 25 MG PO TABS: 25.0000 mg | ORAL_TABLET | Freq: Every day | ORAL | 0 refills | Status: DC

## 2019-10-30 NOTE — Telephone Encounter (Signed)
Please advise 

## 2019-10-30 NOTE — Telephone Encounter (Signed)
10/30/19 Pt sent a mychart message for prescription refills. The refills were denied by provider as patient needs to be seen for refills. I spoke with pt and offered available appts for today and Monday. She said that it was difficult with work schedule and declined appt. She stated that she was going to call her PCP. Moshe Salisbury, CMA

## 2019-10-30 NOTE — Telephone Encounter (Signed)
It looks lke Dr. Juleen China has been filling these meds. Pt refused a appointment due to work schedule. You have not seen patient since 2019. Please advise

## 2019-10-30 NOTE — Telephone Encounter (Signed)
Can fill 1 month but she will need ov for more

## 2019-10-30 NOTE — Telephone Encounter (Signed)
Caller: Loni Kolin Call Back # [phone]  Subject : Medication   Refill : hydrochlorothiazide (HYDRODIURIL) 25 MG tablet [id number]   potassium chloride SA (KLOR-CON) 20 MEQ tablet [id number]     [id number] [address], [place] [address]  [place], [place] [address]  Phone:  [phone] Fax:  [phone]  DEA #:  [id number]

## 2019-10-30 NOTE — Telephone Encounter (Signed)
Refill sent.

## 2019-10-30 NOTE — Telephone Encounter (Signed)
Patient is requesting to establishing care with provider.

## 2019-11-03 ENCOUNTER — Telehealth (INDEPENDENT_AMBULATORY_CARE_PROVIDER_SITE_OTHER): Payer: 59 | Admitting: Family Medicine

## 2019-11-03 ENCOUNTER — Other Ambulatory Visit: Payer: Self-pay

## 2019-11-03 ENCOUNTER — Encounter: Payer: Self-pay | Admitting: Family Medicine

## 2019-11-03 DIAGNOSIS — I10 Essential (primary) hypertension: Secondary | ICD-10-CM

## 2019-11-03 MED ORDER — HYDROCHLOROTHIAZIDE 25 MG PO TABS: 25.0000 mg | ORAL_TABLET | Freq: Every day | ORAL | 3 refills | Status: DC

## 2019-11-03 MED ORDER — POTASSIUM CHLORIDE CRYS ER 20 MEQ PO TBCR: EXTENDED_RELEASE_TABLET | ORAL | 3 refills | Status: DC

## 2019-11-03 NOTE — Progress Notes (Signed)
Virtual Visit via Video Note  I connected with Audrey Duncan on 11/03/19 at  8:40 AM EDT by a video enabled telemedicine application and verified that I am speaking with the correct person using two identifiers.  Location: Patient: home alone  Provider: office    I discussed the limitations of evaluation and management by telemedicine and the availability of in person appointments. The patient expressed understanding and agreed to proceed.  History of Present Illness: Pt is home and is going to healthy weight and wellness   Observations/Objective: There were no vitals filed for this visit.  bp 125/86 pt 86  Wt 275 lb  --- done in March Pt is in nad   Assessment and Plan: 1. Essential hypertension Well controlled, no changes to meds. Encouraged heart healthy diet such as the DASH diet and exercise as tolerated.  - hydrochlorothiazide (HYDRODIURIL) 25 MG tablet; Take 1 tablet (25 mg total) by mouth daily. Pt needs OV for further refills  Dispense: 90 tablet; Refill: 3 - potassium chloride SA (KLOR-CON) 20 MEQ tablet; TAKE 1 TABLET(20 MEQ) BY MOUTH DAILY. Pt needs OV for further refills  Dispense: 90 tablet; Refill: 3  Follow Up Instructions:   f/u 3 months I discussed the assessment and treatment plan with the patient. The patient was provided an opportunity to ask questions and all were answered. The patient agreed with the plan and demonstrated an understanding of the instructions.   The patient was advised to call back or seek an in-person evaluation if the symptoms worsen or if the condition fails to improve as anticipated.  I provided 25 minutes of non-face-to-face time during this encounter.   [name], DO

## 2019-11-03 NOTE — Patient Instructions (Signed)

## 2019-11-05 ENCOUNTER — Telehealth: Payer: 59 | Admitting: Family Medicine

## 2019-11-09 ENCOUNTER — Encounter (INDEPENDENT_AMBULATORY_CARE_PROVIDER_SITE_OTHER): Payer: Self-pay | Admitting: Physician Assistant

## 2019-11-11 NOTE — Telephone Encounter (Signed)
Please review-- last refilled denied due to not having a future appt, several cancellations, and refill request between visits. Renee Ramus, LPN

## 2019-11-19 ENCOUNTER — Ambulatory Visit (INDEPENDENT_AMBULATORY_CARE_PROVIDER_SITE_OTHER): Payer: 59 | Admitting: Family Medicine

## 2019-11-23 ENCOUNTER — Other Ambulatory Visit (INDEPENDENT_AMBULATORY_CARE_PROVIDER_SITE_OTHER): Payer: Self-pay | Admitting: Physician Assistant

## 2019-11-23 DIAGNOSIS — E559 Vitamin D deficiency, unspecified: Secondary | ICD-10-CM

## 2019-12-25 ENCOUNTER — Other Ambulatory Visit (INDEPENDENT_AMBULATORY_CARE_PROVIDER_SITE_OTHER): Payer: Self-pay | Admitting: Physician Assistant

## 2019-12-25 DIAGNOSIS — E559 Vitamin D deficiency, unspecified: Secondary | ICD-10-CM

## 2020-03-05 DIAGNOSIS — D1801 Hemangioma of skin and subcutaneous tissue: Secondary | ICD-10-CM | POA: Diagnosis not present

## 2020-03-05 DIAGNOSIS — L821 Other seborrheic keratosis: Secondary | ICD-10-CM | POA: Diagnosis not present

## 2020-03-05 DIAGNOSIS — D485 Neoplasm of uncertain behavior of skin: Secondary | ICD-10-CM | POA: Diagnosis not present

## 2020-03-05 DIAGNOSIS — D225 Melanocytic nevi of trunk: Secondary | ICD-10-CM | POA: Diagnosis not present

## 2020-03-05 DIAGNOSIS — R239 Unspecified skin changes: Secondary | ICD-10-CM | POA: Diagnosis not present

## 2020-03-05 DIAGNOSIS — D229 Melanocytic nevi, unspecified: Secondary | ICD-10-CM | POA: Diagnosis not present

## 2020-03-05 DIAGNOSIS — L281 Prurigo nodularis: Secondary | ICD-10-CM | POA: Diagnosis not present

## 2020-03-16 ENCOUNTER — Telehealth: Payer: 59 | Admitting: Medical

## 2020-03-16 ENCOUNTER — Telehealth: Payer: Self-pay

## 2020-03-16 DIAGNOSIS — U071 COVID-19: Secondary | ICD-10-CM | POA: Diagnosis not present

## 2020-03-16 DIAGNOSIS — Z20822 Contact with and (suspected) exposure to covid-19: Secondary | ICD-10-CM | POA: Diagnosis not present

## 2020-03-16 NOTE — Telephone Encounter (Signed)
Nurse Assessment Nurse: Susy Manor, RN, Megan Date/Time (Eastern Time): 03/15/2020 9:18:43 AM Confirm and document reason for call. If symptomatic, describe symptoms. ---Caller states she has sinus pain and congestion. Symptoms started Thursday. Her cheeks and teeth hurt. Denies fever. Has the patient had close contact with a person known or suspected to have the novel coronavirus illness OR traveled / lives in area with major community spread (including international travel) in the last 14 days from the onset of symptoms? * If Asymptomatic, screen for exposure and travel within the last 14 days. ---No Does the patient have any new or worsening symptoms? ---Yes Will a triage be completed? ---Yes Related visit to physician within the last 2 weeks? ---No Does the PT have any chronic conditions? (i.e. diabetes, asthma, this includes High risk factors for pregnancy, etc.) ---No Is this a behavioral health or substance abuse call? ---No Guidelines Guideline Title Affirmed Question Affirmed Notes Nurse Date/Time Eilene Ghazi Time) Sinus Pain or Congestion [1] Redness or swelling on the cheek, forehead or around the eye AND [2] no fever Susy Manor, RN, Megan 03/15/2020 9:19:48 AM Disp. Time Eilene Ghazi Time) Disposition Final User PLEASE NOTE: All timestamps contained within this report are represented as [place] Standard Time. CONFIDENTIALTY NOTICE: This fax transmission is intended only for the addressee. It contains information that is legally privileged, confidential or otherwise protected from use or disclosure. If you are not the intended recipient, you are strictly prohibited from reviewing, disclosing, copying using or disseminating any of this information or taking any action in reliance on or regarding this information. If you have received this fax in error, please notify us immediately by telephone so that we can arrange for its return to [place]. Phone: [phone], Toll-Free: [phone], Fax:  [phone] Page: 2 of 2 Call Id: [id number] 03/15/2020 9:21:40 AM See HCP within 4 Hours (or PCP triage) Yes Susy Manor, RN, Delphia Grates Disagree/Comply Disagree Caller Understands Yes PreDisposition [place] Advice Given Per Guideline SEE HCP WITHIN 4 HOURS (OR PCP TRIAGE): * IF OFFICE WILL BE OPEN: You need to be seen within the next 3 or 4 hours. Call your doctor (or NP/PA) now or as soon as the office opens. PAIN MEDICINES: * For pain relief, you can take either acetaminophen, ibuprofen, or naproxen. CALL BACK IF: * You become worse. CARE ADVICE given per Sinus Pain or Congestion (Adult) guideline. Comments User: Sula Soda, RN Date/Time Eilene Ghazi Time): 03/15/2020 9:22:40 AM Caller states she doesn't want to go to an [facility] because of covid-19. She will the office tomorrow to make an appointment. Referrals GO TO FACILITY REFUSED  Pt has appt today w/ Percell Miller.

## 2020-03-19 DIAGNOSIS — U071 COVID-19: Secondary | ICD-10-CM | POA: Diagnosis not present

## 2020-03-19 DIAGNOSIS — Z9189 Other specified personal risk factors, not elsewhere classified: Secondary | ICD-10-CM | POA: Diagnosis not present

## 2020-04-12 ENCOUNTER — Telehealth: Payer: Self-pay | Admitting: Family Medicine

## 2020-04-12 ENCOUNTER — Encounter: Payer: Self-pay | Admitting: Family Medicine

## 2020-04-12 NOTE — Telephone Encounter (Signed)
Caller Audrey Duncan   Call Back # [phone]  Pt called , patient states 3 weeks out from covid dx, patient states she has developed a cough. Patient would like to know what she can take  Please Advise

## 2020-04-13 NOTE — Telephone Encounter (Signed)
Definitely -- we can do a virtual visit today and maybe can get her set up in post covid clinic if needed

## 2020-04-13 NOTE — Telephone Encounter (Signed)
Responded to patient's mychart message.

## 2020-04-19 DIAGNOSIS — D485 Neoplasm of uncertain behavior of skin: Secondary | ICD-10-CM | POA: Diagnosis not present

## 2020-05-10 ENCOUNTER — Other Ambulatory Visit: Payer: Self-pay | Admitting: Family Medicine

## 2020-05-10 DIAGNOSIS — Z1231 Encounter for screening mammogram for malignant neoplasm of breast: Secondary | ICD-10-CM

## 2020-05-30 ENCOUNTER — Other Ambulatory Visit (INDEPENDENT_AMBULATORY_CARE_PROVIDER_SITE_OTHER): Payer: Self-pay | Admitting: Family Medicine

## 2020-05-30 DIAGNOSIS — F3289 Other specified depressive episodes: Secondary | ICD-10-CM

## 2020-06-16 ENCOUNTER — Other Ambulatory Visit: Payer: Self-pay | Admitting: Family Medicine

## 2020-06-16 DIAGNOSIS — F3289 Other specified depressive episodes: Secondary | ICD-10-CM

## 2020-06-16 NOTE — Telephone Encounter (Signed)
Medication: buPROPion (WELLBUTRIN SR) 150 MG 12 hr tablet [[id number]]      Has the patient contacted their pharmacy? (If no, request that the patient contact the pharmacy for the refill.) (If yes, when and what did the pharmacy advise?)     Preferred Pharmacy (with phone number or street name): [id number] [address], [place] [address]  [place], [place] [address]  Phone:  [phone] Fax:  [phone]      Agent: Please be advised that RX refills may take up to 3 business days. We ask that you follow-up with your pharmacy.

## 2020-06-17 MED ORDER — BUPROPION HCL ER (SR) 150 MG PO TB12: 150.0000 mg | ORAL_TABLET | Freq: Every day | ORAL | 0 refills | Status: DC

## 2020-06-18 ENCOUNTER — Ambulatory Visit
Admission: RE | Admit: 2020-06-18 | Discharge: 2020-06-18 | Disposition: A | Discharge status: Home / Self Care | Payer: 59 | Source: Ambulatory Visit | Attending: Family Medicine | Admitting: Family Medicine

## 2020-06-18 ENCOUNTER — Other Ambulatory Visit: Payer: Self-pay

## 2020-06-18 DIAGNOSIS — Z1231 Encounter for screening mammogram for malignant neoplasm of breast: Secondary | ICD-10-CM | POA: Diagnosis not present

## 2020-06-21 ENCOUNTER — Ambulatory Visit: Payer: 59 | Admitting: Family Medicine

## 2020-06-21 ENCOUNTER — Encounter: Payer: Self-pay | Admitting: Family Medicine

## 2020-06-21 ENCOUNTER — Other Ambulatory Visit: Payer: Self-pay

## 2020-06-21 VITALS — BP 120/92 | HR 77 | Temp 98.0°F | Resp 18 | Ht 64.0 in | Wt 279.4 lb

## 2020-06-21 DIAGNOSIS — M1712 Unilateral primary osteoarthritis, left knee: Secondary | ICD-10-CM | POA: Diagnosis not present

## 2020-06-21 DIAGNOSIS — I10 Essential (primary) hypertension: Secondary | ICD-10-CM

## 2020-06-21 DIAGNOSIS — E876 Hypokalemia: Secondary | ICD-10-CM

## 2020-06-21 DIAGNOSIS — E559 Vitamin D deficiency, unspecified: Secondary | ICD-10-CM

## 2020-06-21 LAB — CBC WITH DIFFERENTIAL/PLATELET
Basophils Absolute: 0.1 10*3/uL (ref 0.0–0.1)
Basophils Relative: 1.3 % (ref 0.0–3.0)
Eosinophils Absolute: 0.2 10*3/uL (ref 0.0–0.7)
Eosinophils Relative: 2.9 % (ref 0.0–5.0)
HCT: 40.4 % (ref 36.0–46.0)
Hemoglobin: 13.5 g/dL (ref 12.0–15.0)
Lymphocytes Relative: 25.4 % (ref 12.0–46.0)
Lymphs Abs: 1.7 10*3/uL (ref 0.7–4.0)
MCHC: 33.5 g/dL (ref 30.0–36.0)
MCV: 93.9 fl (ref 78.0–100.0)
Monocytes Absolute: 0.5 10*3/uL (ref 0.1–1.0)
Monocytes Relative: 7.5 % (ref 3.0–12.0)
Neutro Abs: 4.3 10*3/uL (ref 1.4–7.7)
Neutrophils Relative %: 62.9 % (ref 43.0–77.0)
Platelets: 275 10*3/uL (ref 150.0–400.0)
RBC: 4.31 Mil/uL (ref 3.87–5.11)
RDW: 12.8 % (ref 11.5–15.5)
WBC: 6.8 10*3/uL (ref 4.0–10.5)

## 2020-06-21 LAB — COMPREHENSIVE METABOLIC PANEL
ALT: 27 U/L (ref 0–35)
AST: 25 U/L (ref 0–37)
Albumin: 4.1 g/dL (ref 3.5–5.2)
Alkaline Phosphatase: 87 U/L (ref 39–117)
BUN: 16 mg/dL (ref 6–23)
CO2: 32 mEq/L (ref 19–32)
Calcium: 9.6 mg/dL (ref 8.4–10.5)
Chloride: 102 mEq/L (ref 96–112)
Creatinine, Ser: 0.69 mg/dL (ref 0.40–1.20)
GFR: 93.64 mL/min (ref >60.00)
Glucose, Bld: 83 mg/dL (ref 70–99)
Potassium: 4.1 mEq/L (ref 3.5–5.1)
Sodium: 140 mEq/L (ref 135–145)
Total Bilirubin: 0.9 mg/dL (ref 0.2–1.2)
Total Protein: 6.5 g/dL (ref 6.0–8.3)

## 2020-06-21 LAB — LIPID PANEL
Cholesterol: 154 mg/dL (ref 0–200)
HDL: 50 mg/dL (ref >39.00)
LDL Cholesterol: 84 mg/dL (ref 0–99)
NonHDL: 103.58
Total CHOL/HDL Ratio: 3
Triglycerides: 96 mg/dL (ref 0.0–149.0)
VLDL: 19.2 mg/dL (ref 0.0–40.0)

## 2020-06-21 LAB — VITAMIN D 25 HYDROXY (VIT D DEFICIENCY, FRACTURES): VITD: 26.57 ng/mL — ABNORMAL LOW (ref 30.00–100.00)

## 2020-06-21 MED ORDER — MELOXICAM 15 MG PO TABS: ORAL_TABLET | ORAL | 2 refills | Status: DC

## 2020-06-21 NOTE — Patient Instructions (Signed)
DASH Eating Plan DASH stands for "Dietary Approaches to Stop Hypertension." The DASH eating plan is a healthy eating plan that has been shown to reduce high blood pressure (hypertension). It may also reduce your risk for type 2 diabetes, heart disease, and stroke. The DASH eating plan may also help with weight loss. What are tips for following this plan?  General guidelines  Avoid eating more than 2,300 mg (milligrams) of salt (sodium) a day. If you have hypertension, you may need to reduce your sodium intake to 1,500 mg a day.  Limit alcohol intake to no more than 1 drink a day for nonpregnant women and 2 drinks a day for men. One drink equals 12 oz of beer, 5 oz of wine, or 1 oz of hard liquor.  Work with your health care provider to maintain a healthy body weight or to lose weight. Ask what an ideal weight is for you.  Get at least 30 minutes of exercise that causes your heart to beat faster (aerobic exercise) most days of the week. Activities may include walking, swimming, or biking.  Work with your health care provider or diet and nutrition specialist (dietitian) to adjust your eating plan to your individual calorie needs. Reading food labels   Check food labels for the amount of sodium per serving. Choose foods with less than 5 percent of the Daily Value of sodium. Generally, foods with less than 300 mg of sodium per serving fit into this eating plan.  To find whole grains, look for the word "whole" as the first word in the ingredient list. Shopping  Buy products labeled as "low-sodium" or "no salt added."  Buy fresh foods. Avoid canned foods and premade or frozen meals. Cooking  Avoid adding salt when cooking. Use salt-free seasonings or herbs instead of table salt or sea salt. Check with your health care provider or pharmacist before using salt substitutes.  Do not fry foods. Cook foods using healthy methods such as baking, boiling, grilling, and broiling instead.  Cook with  heart-healthy oils, such as olive, canola, soybean, or sunflower oil. Meal planning  Eat a balanced diet that includes: ? 5 or more servings of fruits and vegetables each day. At each meal, try to fill half of your plate with fruits and vegetables. ? Up to 6-8 servings of whole grains each day. ? Less than 6 oz of lean meat, poultry, or fish each day. A 3-oz serving of meat is about the same size as a deck of cards. One egg equals 1 oz. ? 2 servings of low-fat dairy each day. ? A serving of nuts, seeds, or beans 5 times each week. ? Heart-healthy fats. Healthy fats called Omega-3 fatty acids are found in foods such as flaxseeds and coldwater fish, like sardines, salmon, and mackerel.  Limit how much you eat of the following: ? Canned or prepackaged foods. ? Food that is high in trans fat, such as fried foods. ? Food that is high in saturated fat, such as fatty meat. ? Sweets, desserts, sugary drinks, and other foods with added sugar. ? Full-fat dairy products.  Do not salt foods before eating.  Try to eat at least 2 vegetarian meals each week.  Eat more home-cooked food and less restaurant, buffet, and fast food.  When eating at a restaurant, ask that your food be prepared with less salt or no salt, if possible. What foods are recommended? The items listed may not be a complete list. Talk with your dietitian about   what dietary choices are best for you. Grains Whole-grain or whole-wheat bread. Whole-grain or whole-wheat pasta. Brown rice. Oatmeal. Quinoa. Bulgur. Whole-grain and low-sodium cereals. Pita bread. Low-fat, low-sodium crackers. Whole-wheat flour tortillas. Vegetables Fresh or frozen vegetables (raw, steamed, roasted, or grilled). Low-sodium or reduced-sodium tomato and vegetable juice. Low-sodium or reduced-sodium tomato sauce and tomato paste. Low-sodium or reduced-sodium canned vegetables. Fruits All fresh, dried, or frozen fruit. Canned fruit in natural juice (without  added sugar). Meat and other protein foods Skinless chicken or turkey. Ground chicken or turkey. Pork with fat trimmed off. Fish and seafood. Egg whites. Dried beans, peas, or lentils. Unsalted nuts, nut butters, and seeds. Unsalted canned beans. Lean cuts of beef with fat trimmed off. Low-sodium, lean deli meat. Dairy Low-fat (1%) or fat-free (skim) milk. Fat-free, low-fat, or reduced-fat cheeses. Nonfat, low-sodium ricotta or cottage cheese. Low-fat or nonfat yogurt. Low-fat, low-sodium cheese. Fats and oils Soft margarine without trans fats. Vegetable oil. Low-fat, reduced-fat, or light mayonnaise and salad dressings (reduced-sodium). Canola, safflower, olive, soybean, and sunflower oils. Avocado. Seasoning and other foods Herbs. Spices. Seasoning mixes without salt. Unsalted popcorn and pretzels. Fat-free sweets. What foods are not recommended? The items listed may not be a complete list. Talk with your dietitian about what dietary choices are best for you. Grains Baked goods made with fat, such as croissants, muffins, or some breads. Dry pasta or rice meal packs. Vegetables Creamed or fried vegetables. Vegetables in a cheese sauce. Regular canned vegetables (not low-sodium or reduced-sodium). Regular canned tomato sauce and paste (not low-sodium or reduced-sodium). Regular tomato and vegetable juice (not low-sodium or reduced-sodium). Pickles. Olives. Fruits Canned fruit in a light or heavy syrup. Fried fruit. Fruit in cream or butter sauce. Meat and other protein foods Fatty cuts of meat. Ribs. Fried meat. Bacon. Sausage. Bologna and other processed lunch meats. Salami. Fatback. Hotdogs. Bratwurst. Salted nuts and seeds. Canned beans with added salt. Canned or smoked fish. Whole eggs or egg yolks. Chicken or turkey with skin. Dairy Whole or 2% milk, cream, and half-and-half. Whole or full-fat cream cheese. Whole-fat or sweetened yogurt. Full-fat cheese. Nondairy creamers. Whipped toppings.  Processed cheese and cheese spreads. Fats and oils Butter. Stick margarine. Lard. Shortening. Ghee. Bacon fat. Tropical oils, such as coconut, palm kernel, or palm oil. Seasoning and other foods Salted popcorn and pretzels. Onion salt, garlic salt, seasoned salt, table salt, and sea salt. Worcestershire sauce. Tartar sauce. Barbecue sauce. Teriyaki sauce. Soy sauce, including reduced-sodium. Steak sauce. Canned and packaged gravies. Fish sauce. Oyster sauce. Cocktail sauce. Horseradish that you find on the shelf. Ketchup. Mustard. Meat flavorings and tenderizers. Bouillon cubes. Hot sauce and Tabasco sauce. Premade or packaged marinades. Premade or packaged taco seasonings. Relishes. Regular salad dressings. Where to find more information:  National Heart, Lung, and Blood Institute: www.nhlbi.nih.gov  American Heart Association: www.heart.org Summary  The DASH eating plan is a healthy eating plan that has been shown to reduce high blood pressure (hypertension). It may also reduce your risk for type 2 diabetes, heart disease, and stroke.  With the DASH eating plan, you should limit salt (sodium) intake to 2,300 mg a day. If you have hypertension, you may need to reduce your sodium intake to 1,500 mg a day.  When on the DASH eating plan, aim to eat more fresh fruits and vegetables, whole grains, lean proteins, low-fat dairy, and heart-healthy fats.  Work with your health care provider or diet and nutrition specialist (dietitian) to adjust your eating plan to your   individual calorie needs. This information is not intended to replace advice given to you by your health care provider. Make sure you discuss any questions you have with your health care provider. Document Revised: 06/08/2017 Document Reviewed: 06/19/2016 Elsevier Patient Education  2020 [employer]vier Inc.  

## 2020-06-21 NOTE — Assessment & Plan Note (Signed)
Check cmp today 

## 2020-06-21 NOTE — Progress Notes (Signed)
Patient ID: Audrey Duncan, female    DOB: 1961-02-25  Age: 59 y.o. MRN: [id number]    Subjective:  Subjective  HPI Audrey Duncan presents for f/u bp. She also c/o L knee pain    Review of Systems  Constitutional: Negative for appetite change, diaphoresis, fatigue and unexpected weight change.  Eyes: Negative for pain, redness and visual disturbance.  Respiratory: Negative for cough, chest tightness, shortness of breath and wheezing.   Cardiovascular: Negative for chest pain, palpitations and leg swelling.  Endocrine: Negative for cold intolerance, heat intolerance, polydipsia, polyphagia and polyuria.  Genitourinary: Negative for difficulty urinating, dysuria and frequency.  Neurological: Negative for dizziness, light-headedness, numbness and headaches.    History Past Medical History:  Diagnosis Date  . Asthma   . Chiari malformation type II ([place])    diagnosed when in high school  . Chiari malformation type II ([place])   . Colon polyp 2006   (TUBULAR ADENOMA)--COLONOSCOPY-DR. HAYES  . Constipation   . Fluid retention   . H/O seasonal allergies   . Medial meniscus tear    left  . Over weight   . Ulcerative colitis ([place])     She has a past surgical history that includes Hernia repair (2004); Abdominal hysterectomy (1999); and Cesarean section (1995 and 1998).   Her family history includes Alcohol abuse in her brother and father; Asthma in her sister; COPD in her father; Cancer in her father and mother; Diabetes in her paternal uncle; Heart disease (age of onset: 106) in her mother; Hypertension in her father and mother; Kidney disease in her mother; Lung cancer in her mother; Obesity in her father and mother; Stroke (age of onset: 65) in her mother.She reports that she has never smoked. She has never used smokeless tobacco. She reports current alcohol use. She reports that she does not use drugs.  Current Outpatient Medications on File Prior to Visit   Medication Sig Dispense Refill  . buPROPion (WELLBUTRIN SR) 150 MG 12 hr tablet Take 1 tablet (150 mg total) by mouth daily. 30 tablet 0  . hydrochlorothiazide (HYDRODIURIL) 25 MG tablet Take 1 tablet (25 mg total) by mouth daily. Pt needs OV for further refills 90 tablet 3  . loratadine (CLARITIN) 10 MG tablet Take 10 mg by mouth daily.    . Magnesium 250 MG TABS Take 1 tablet by mouth daily.    . polyethylene glycol powder (GLYCOLAX/MIRALAX) powder Take 17 g by mouth daily. (Patient taking differently: Take 17 g by mouth daily as needed.) 3350 g 0  . potassium chloride SA (KLOR-CON) 20 MEQ tablet TAKE 1 TABLET(20 MEQ) BY MOUTH DAILY. Pt needs OV for further refills 90 tablet 3  . Probiotic Product (ALIGN PO) Take 1 capsule by mouth daily.    . Vitamin D, Ergocalciferol, (DRISDOL) 1.25 MG (50000 UNIT) CAPS capsule Take 1 capsule (50,000 Units total) by mouth every 7 (seven) days. 4 capsule 0   Current Facility-Administered Medications on File Prior to Visit  Medication Dose Route Frequency Provider Last Rate Last Admin  . 0.9 %  sodium chloride infusion  500 mL Intravenous Continuous Danis, Kirke Corin, MD         Objective:  Objective  Physical Exam Vitals and nursing note reviewed.  Constitutional:      Appearance: She is well-developed and well-nourished.  HENT:     Head: Normocephalic and atraumatic.  Eyes:     Extraocular Movements: EOM normal.     Conjunctiva/sclera: Conjunctivae normal.  Neck:     Thyroid: No thyromegaly.     Vascular: No carotid bruit or JVD.  Cardiovascular:     Rate and Rhythm: Normal rate and regular rhythm.     Heart sounds: Normal heart sounds. No murmur heard.   Pulmonary:     Effort: Pulmonary effort is normal. No respiratory distress.     Breath sounds: Normal breath sounds. No wheezing or rales.  Chest:     Chest wall: No tenderness.  Musculoskeletal:        General: No edema.     Cervical back: Normal range of motion and neck supple.   Neurological:     Mental Status: She is alert and oriented to person, place, and time.  Psychiatric:        Mood and Affect: Mood and affect normal.    BP (!) 120/92 (BP Location: Right Arm, Patient Position: Sitting, Cuff Size: Large)   Pulse 77   Temp 98 F (36.7 C) (Oral)   Resp 18   Ht 5\' 4"  (1.626 m)   Wt 279 lb 6.4 oz (126.7 kg)   SpO2 95%   BMI 47.96 kg/m  Wt Readings from Last 3 Encounters:  06/21/20 279 lb 6.4 oz (126.7 kg)  09/11/19 275 lb (124.7 kg)  03/19/19 271 lb (122.9 kg)     Lab Results  Component Value Date   WBC 6.7 09/11/2019   HGB 14.2 09/11/2019   HCT 44.0 09/11/2019   PLT 325 09/11/2019   GLUCOSE 104 (H) 09/11/2019   CHOL 142 09/11/2019   TRIG 83 09/11/2019   HDL 51 09/11/2019   LDLCALC 75 09/11/2019   ALT 29 09/11/2019   AST 28 09/11/2019   NA 144 09/11/2019   K 4.0 09/11/2019   CL 104 09/11/2019   CREATININE 0.73 09/11/2019   BUN 15 09/11/2019   CO2 25 09/11/2019   TSH 1.750 09/11/2019   HGBA1C 5.3 09/11/2019    No results found.   Assessment & Plan:  Plan  I have discontinued Audrey [place] "Jenn"'s metFORMIN. I am also having her start on meloxicam. Additionally, I am having her maintain her Magnesium, Probiotic Product (ALIGN PO), polyethylene glycol powder, Vitamin D (Ergocalciferol), loratadine, hydrochlorothiazide, potassium chloride SA, and buPROPion. We will continue to administer sodium chloride.  Meds ordered this encounter  Medications  . meloxicam (MOBIC) 15 MG tablet    Sig: 1/2-1 po qd prn    Dispense:  30 tablet    Refill:  2    Problem List Items Addressed This Visit      Unprioritized   Essential hypertension    Well controlled, no changes to meds. Encouraged heart healthy diet such as the DASH diet and exercise as tolerated.       HYPOKALEMIA    Check cmp today      Vitamin D deficiency   Relevant Orders   Vitamin D (25 hydroxy)    Other Visit Diagnoses    Primary hypertension    -  Primary    Relevant Orders   CBC with Differential/Platelet   Lipid panel   Comprehensive metabolic panel   Primary osteoarthritis of left knee       Relevant Medications   meloxicam (MOBIC) 15 MG tablet      Follow-up: Return in about 6 months (around 12/20/2020), or if symptoms worsen or fail to improve, for annual exam, fasting.  [name], DO

## 2020-06-21 NOTE — Assessment & Plan Note (Signed)
Well controlled, no changes to meds. Encouraged heart healthy diet such as the DASH diet and exercise as tolerated.  °

## 2020-06-22 NOTE — Telephone Encounter (Signed)
Please tell her to wait until I see them and I will respond

## 2020-06-23 ENCOUNTER — Encounter: Payer: Self-pay | Admitting: Family Medicine

## 2020-06-23 ENCOUNTER — Other Ambulatory Visit: Payer: Self-pay

## 2020-06-23 DIAGNOSIS — E559 Vitamin D deficiency, unspecified: Secondary | ICD-10-CM

## 2020-06-23 MED ORDER — VITAMIN D (ERGOCALCIFEROL) 1.25 MG (50000 UNIT) PO CAPS: 50000.0000 [IU] | ORAL_CAPSULE | ORAL | 0 refills | Status: DC

## 2020-07-25 ENCOUNTER — Other Ambulatory Visit: Payer: Self-pay | Admitting: Family Medicine

## 2020-07-25 DIAGNOSIS — F3289 Other specified depressive episodes: Secondary | ICD-10-CM

## 2020-09-23 ENCOUNTER — Other Ambulatory Visit: Payer: Self-pay | Admitting: Family Medicine

## 2020-09-23 DIAGNOSIS — M1712 Unilateral primary osteoarthritis, left knee: Secondary | ICD-10-CM

## 2020-09-23 DIAGNOSIS — E559 Vitamin D deficiency, unspecified: Secondary | ICD-10-CM

## 2020-10-06 ENCOUNTER — Other Ambulatory Visit: Payer: Self-pay | Admitting: *Deleted

## 2020-10-06 NOTE — Patient Outreach (Signed)
[facility]) Care Management  10/06/2020  Audrey Duncan 07-09-1961 [id number]  Unsuccessful outreach attempt made to patient. Patient answered the phone and stated that she would not be able to speak today. She did request that this nurse call back at a later date.   Plan: RN Health Coach will call patient within the next several business days.  [name] RN, BSN [place] [phone] [email or web address]

## 2020-10-12 ENCOUNTER — Other Ambulatory Visit: Payer: Self-pay | Admitting: *Deleted

## 2020-10-12 NOTE — Patient Outreach (Signed)
[facility]) Care Management  10/12/2020  Audrey Duncan Sep 06, 1960 [id number]  Unsuccessful outreach attempt made to patient. RN Health Coach left HIPAA compliant voicemail message along with her contact information.  Plan: RN Health Coach will call patient within the next several Business days and will send an unsuccessful letter.   [name] RN, BSN [place] [phone] [email or web address]

## 2020-10-14 ENCOUNTER — Other Ambulatory Visit: Payer: Self-pay | Admitting: Family Medicine

## 2020-10-14 DIAGNOSIS — E559 Vitamin D deficiency, unspecified: Secondary | ICD-10-CM

## 2020-10-20 ENCOUNTER — Ambulatory Visit: Payer: Self-pay | Admitting: *Deleted

## 2020-10-25 ENCOUNTER — Encounter: Payer: Self-pay | Admitting: Family Medicine

## 2020-11-10 ENCOUNTER — Other Ambulatory Visit: Payer: Self-pay | Admitting: Family Medicine

## 2020-11-10 DIAGNOSIS — I10 Essential (primary) hypertension: Secondary | ICD-10-CM

## 2020-11-24 ENCOUNTER — Other Ambulatory Visit: Payer: Self-pay | Admitting: Family Medicine

## 2020-11-24 DIAGNOSIS — E559 Vitamin D deficiency, unspecified: Secondary | ICD-10-CM

## 2020-11-24 DIAGNOSIS — I10 Essential (primary) hypertension: Secondary | ICD-10-CM

## 2020-11-29 ENCOUNTER — Ambulatory Visit (INDEPENDENT_AMBULATORY_CARE_PROVIDER_SITE_OTHER): Payer: Managed Care, Other (non HMO) | Admitting: Family Medicine

## 2020-11-29 ENCOUNTER — Encounter: Payer: Self-pay | Admitting: Family Medicine

## 2020-11-29 ENCOUNTER — Other Ambulatory Visit: Payer: Self-pay

## 2020-11-29 VITALS — BP 124/82 | HR 76 | Temp 98.6°F | Resp 18 | Ht 64.0 in | Wt 244.0 lb

## 2020-11-29 DIAGNOSIS — R7303 Prediabetes: Secondary | ICD-10-CM

## 2020-11-29 DIAGNOSIS — Z23 Encounter for immunization: Secondary | ICD-10-CM

## 2020-11-29 DIAGNOSIS — C439 Malignant melanoma of skin, unspecified: Secondary | ICD-10-CM | POA: Diagnosis not present

## 2020-11-29 DIAGNOSIS — I10 Essential (primary) hypertension: Secondary | ICD-10-CM | POA: Diagnosis not present

## 2020-11-29 DIAGNOSIS — Z0000 Encounter for general adult medical examination without abnormal findings: Secondary | ICD-10-CM

## 2020-11-29 DIAGNOSIS — M1712 Unilateral primary osteoarthritis, left knee: Secondary | ICD-10-CM

## 2020-11-29 DIAGNOSIS — F3289 Other specified depressive episodes: Secondary | ICD-10-CM

## 2020-11-29 MED ORDER — BUPROPION HCL ER (SR) 150 MG PO TB12: 150.0000 mg | ORAL_TABLET | Freq: Every day | ORAL | 3 refills | Status: DC

## 2020-11-29 MED ORDER — MELOXICAM 15 MG PO TABS: 7.5000 mg | ORAL_TABLET | Freq: Every day | ORAL | 3 refills | Status: DC | PRN

## 2020-11-29 MED ORDER — HYDROCHLOROTHIAZIDE 25 MG PO TABS: 25.0000 mg | ORAL_TABLET | Freq: Every day | ORAL | 3 refills | Status: DC

## 2020-11-29 NOTE — Assessment & Plan Note (Signed)
Well controlled, no changes to meds. Encouraged heart healthy diet such as the DASH diet and exercise as tolerated.  °

## 2020-11-29 NOTE — Assessment & Plan Note (Signed)
Pt started optavia

## 2020-11-29 NOTE — Assessment & Plan Note (Signed)
ghm utd Check labs  See avs  

## 2020-11-29 NOTE — Assessment & Plan Note (Signed)
Check labs 

## 2020-11-29 NOTE — Progress Notes (Signed)
Patient ID: Audrey Duncan, female    DOB: 03-09-1961  Age: 60 y.o. MRN: [id number]    Subjective:  Subjective  HPI Audrey Duncan presents for a comprehensive physical examination today. She reports feeling well. She endorses drinking optavia shake and had lost 30 lbs.  Wt Readings from Last 3 Encounters:  11/29/20 244 lb (110.7 kg)  06/21/20 279 lb 6.4 oz (126.7 kg)  09/11/19 275 lb (124.7 kg)  She endorses taking 150 mg Bupropion PO Daily for her dx of depression.  She notes that she had a melanoma removed at her dermatologist. She denies any chest pain, SOB, fever, abdominal pain, cough, chills, sore throat, dysuria, urinary incontinence, back pain, HA, or N/V/D at this time. Pt agrees to a tetanus vaccine.  Review of Systems  Constitutional: Negative for chills, fatigue and fever.  HENT: Negative for ear pain, rhinorrhea, sinus pressure, sinus pain, sore throat and tinnitus.   Eyes: Negative for pain.  Respiratory: Negative for cough, shortness of breath and wheezing.   Cardiovascular: Negative for chest pain.  Gastrointestinal: Negative for abdominal pain, anal bleeding, constipation, diarrhea, nausea and vomiting.  Genitourinary: Negative for flank pain.  Musculoskeletal: Negative for back pain and neck pain.  Skin: Negative for rash.  Neurological: Negative for seizures, weakness, light-headedness, numbness and headaches.    History Past Medical History:  Diagnosis Date  . Asthma   . Chiari malformation type II ([place])    diagnosed when in high school  . Chiari malformation type II ([place])   . Colon polyp 2006   (TUBULAR ADENOMA)--COLONOSCOPY-DR. HAYES  . Constipation   . Fluid retention   . H/O seasonal allergies   . Medial meniscus tear    left  . Over weight   . Ulcerative colitis ([place])     She has a past surgical history that includes Hernia repair (2004); Abdominal hysterectomy (1999); and Cesarean section (1995 and 1998).   Her family  history includes Alcohol abuse in her brother and father; Asthma in her sister; COPD in her father; Cancer in her father and mother; Diabetes in her paternal uncle; Heart disease (age of onset: 45) in her mother; Hypertension in her father and mother; Kidney disease in her mother; Lung cancer in her mother; Obesity in her father and mother; Stroke (age of onset: 34) in her mother.She reports that she has never smoked. She has never used smokeless tobacco. She reports current alcohol use. She reports that she does not use drugs.  Current Outpatient Medications on File Prior to Visit  Medication Sig Dispense Refill  . loratadine (CLARITIN) 10 MG tablet Take 10 mg by mouth daily.    . Magnesium 250 MG TABS Take 1 tablet by mouth daily.    . polyethylene glycol powder (GLYCOLAX/MIRALAX) powder Take 17 g by mouth daily. (Patient taking differently: Take 17 g by mouth daily as needed.) 3350 g 0  . potassium chloride SA (KLOR-CON M20) 20 MEQ tablet TAKE 1 TABLET(20 MEQ) BY MOUTH DAILY. PT NEEDS OV FOR FURTHER REFILLS 90 tablet 1  . Probiotic Product (ALIGN PO) Take 1 capsule by mouth daily.    . Vitamin D, Ergocalciferol, (DRISDOL) 1.25 MG (50000 UNIT) CAPS capsule TAKE 1 CAPSULE (50,000 UNITS TOTAL) BY MOUTH EVERY 7 (SEVEN) DAYS 4 capsule 2   Current Facility-Administered Medications on File Prior to Visit  Medication Dose Route Frequency Provider Last Rate Last Admin  . 0.9 %  sodium chloride infusion  500 mL Intravenous Continuous Nelida Meuse  III, MD         Objective:  Objective  Physical Exam Vitals and nursing note reviewed.  Constitutional:      General: She is not in acute distress.    Appearance: Normal appearance. She is well-developed. She is not ill-appearing.  HENT:     Head: Normocephalic and atraumatic.     Right Ear: External ear normal.     Left Ear: External ear normal.     Nose: Nose normal.  Eyes:     General:        Right eye: No discharge.        Left eye: No  discharge.     Extraocular Movements: Extraocular movements intact.     Pupils: Pupils are equal, round, and reactive to light.  Cardiovascular:     Rate and Rhythm: Normal rate and regular rhythm.     Pulses: Normal pulses.     Heart sounds: Normal heart sounds. No murmur heard. No friction rub. No gallop.   Pulmonary:     Effort: Pulmonary effort is normal. No respiratory distress.     Breath sounds: Normal breath sounds. No stridor. No wheezing, rhonchi or rales.  Chest:     Chest wall: No tenderness.  Abdominal:     General: Bowel sounds are normal. There is no distension.     Palpations: Abdomen is soft. There is no mass.     Tenderness: There is no abdominal tenderness. There is no guarding or rebound.     Hernia: No hernia is present.  Musculoskeletal:        General: Normal range of motion.     Cervical back: Normal range of motion and neck supple.     Right lower leg: No edema.     Left lower leg: No edema.  Skin:    General: Skin is warm and dry.  Neurological:     Mental Status: She is alert and oriented to person, place, and time.  Psychiatric:        Behavior: Behavior normal.        Thought Content: Thought content normal.    BP 124/82 (BP Location: Right Arm, Patient Position: Sitting, Cuff Size: Normal)   Pulse 76   Temp 98.6 F (37 C) (Oral)   Resp 18   Ht 5\' 4"  (1.626 m)   Wt 244 lb (110.7 kg)   SpO2 98%   BMI 41.88 kg/m  Wt Readings from Last 3 Encounters:  11/29/20 244 lb (110.7 kg)  06/21/20 279 lb 6.4 oz (126.7 kg)  09/11/19 275 lb (124.7 kg)     Lab Results  Component Value Date   WBC 6.8 06/21/2020   HGB 13.5 06/21/2020   HCT 40.4 06/21/2020   PLT 275.0 06/21/2020   GLUCOSE 83 06/21/2020   CHOL 154 06/21/2020   TRIG 96.0 06/21/2020   HDL 50.00 06/21/2020   LDLCALC 84 06/21/2020   ALT 27 06/21/2020   AST 25 06/21/2020   NA 140 06/21/2020   K 4.1 06/21/2020   CL 102 06/21/2020   CREATININE 0.69 06/21/2020   BUN 16 06/21/2020    CO2 32 06/21/2020   TSH 1.750 09/11/2019   HGBA1C 5.3 09/11/2019    MM 3D SCREEN BREAST BILATERAL  Result Date: 06/23/2020 CLINICAL DATA:  Screening. EXAM: DIGITAL SCREENING BILATERAL MAMMOGRAM WITH TOMO AND CAD COMPARISON:  Previous exam(s). ACR Breast Density Category a: The breast tissue is almost entirely fatty. FINDINGS: There are no findings suspicious for malignancy.  Images were processed with CAD. IMPRESSION: No mammographic evidence of malignancy. A result letter of this screening mammogram will be mailed directly to the patient. RECOMMENDATION: Screening mammogram in one year. (Code:SM-B-01Y) BI-RADS CATEGORY  1: Negative. Electronically Signed   By: Lajean Manes M.D.   On: 06/23/2020 11:17     Assessment & Plan:  Plan    Meds ordered this encounter  Medications  . buPROPion (WELLBUTRIN SR) 150 MG 12 hr tablet    Sig: Take 1 tablet (150 mg total) by mouth daily.    Dispense:  90 tablet    Refill:  3  . hydrochlorothiazide (HYDRODIURIL) 25 MG tablet    Sig: Take 1 tablet (25 mg total) by mouth daily. Pt needs OV for further refills    Dispense:  90 tablet    Refill:  3  . meloxicam (MOBIC) 15 MG tablet    Sig: Take 0.5-1 tablets (7.5-15 mg total) by mouth daily as needed for pain.    Dispense:  90 tablet    Refill:  3    Problem List Items Addressed This Visit      Unprioritized   Depression   Relevant Medications   buPROPion (WELLBUTRIN SR) 150 MG 12 hr tablet   Essential hypertension    Well controlled, no changes to meds. Encouraged heart healthy diet such as the DASH diet and exercise as tolerated.       Relevant Medications   hydrochlorothiazide (HYDRODIURIL) 25 MG tablet   Other Relevant Orders   Lipid panel   CBC with Differential/Platelet   TSH   Comprehensive metabolic panel   Prediabetes    Check labs       Preventative health care - Primary    ghm utd Check labs  See avs       Relevant Orders   Lipid panel   CBC with  Differential/Platelet   TSH   Comprehensive metabolic panel   Severe obesity (BMI >= 40) (H[facility])    Pt started optavia         Other Visit Diagnoses    Primary osteoarthritis of left knee       Relevant Medications   meloxicam (MOBIC) 15 MG tablet   Melanoma of skin ([place])       Need for tetanus booster       Relevant Orders   Tdap vaccine greater than or equal to 62yo IM (Completed)     Colonoscopy: Last completed on 04/21/2016, Diverticulosis noted , repeat every 10 years.   Mammo: Last completed on 06/18/2020, results were normal, repeat every 1 year.  Pap Smear: Last completed on 03/17/2009, results were normal, repeat every 3 years.   Follow-up: Return in about 6 months (around 06/01/2021), or if symptoms worsen or fail to improve.   I,Gordon Zheng,acting as a Education administrator for [employer], DO.,have documented all relevant documentation on the behalf of [name], DO,as directed by  [name], DO while in the presence of [place], DO, have reviewed all documentation for this visit. The documentation on 11/29/20 for the exam, diagnosis, procedures, and orders are all accurate and complete.

## 2020-11-29 NOTE — Patient Instructions (Signed)
Preventive Care 62-62 Years Old, Female Preventive care refers to lifestyle choices and visits with your health care provider that can promote health and wellness. This includes:  A yearly physical exam. This is also called an annual wellness visit.  Regular dental and eye exams.  Immunizations.  Screening for certain conditions.  Healthy lifestyle choices, such as: ? Eating a healthy diet. ? Getting regular exercise. ? Not using drugs or products that contain nicotine and tobacco. ? Limiting alcohol use. What can I expect for my preventive care visit? Physical exam Your health care provider will check your:  Height and weight. These may be used to calculate your BMI (body mass index). BMI is a measurement that tells if you are at a healthy weight.  Heart rate and blood pressure.  Body temperature.  Skin for abnormal spots. Counseling Your health care provider may ask you questions about your:  Past medical problems.  Family's medical history.  Alcohol, tobacco, and drug use.  Emotional well-being.  Home life and relationship well-being.  Sexual activity.  Diet, exercise, and sleep habits.  Work and work Statistician.  Access to firearms.  Method of birth control.  Menstrual cycle.  Pregnancy history. What immunizations do I need? Vaccines are usually given at various ages, according to a schedule. Your health care provider will recommend vaccines for you based on your age, medical history, and lifestyle or other factors, such as travel or where you work.   What tests do I need? Blood tests  Lipid and cholesterol levels. These may be checked every 5 years, or more often if you are over 62 years old.  Hepatitis C test.  Hepatitis B test. Screening  Lung cancer screening. You may have this screening every year starting at age 62 if you have a 30-pack-year history of smoking and currently smoke or have quit within the past 15 years.  Colorectal cancer  screening. ? All adults should have this screening starting at age 62 and continuing until age 17. ? Your health care provider may recommend screening at age 62 if you are at increased risk. ? You will have tests every 1-10 years, depending on your results and the type of screening test.  Diabetes screening. ? This is done by checking your blood sugar (glucose) after you have not eaten for a while (fasting). ? You may have this done every 1-3 years.  Mammogram. ? This may be done every 1-2 years. ? Talk with your health care provider about when you should start having regular mammograms. This may depend on whether you have a family history of breast cancer.  BRCA-related cancer screening. This may be done if you have a family history of breast, ovarian, tubal, or peritoneal cancers.  Pelvic exam and Pap test. ? This may be done every 3 years starting at age 62. ? Starting at age 11, this may be done every 5 years if you have a Pap test in combination with an HPV test. Other tests  STD (sexually transmitted disease) testing, if you are at risk.  Bone density scan. This is done to screen for osteoporosis. You may have this scan if you are at high risk for osteoporosis. Talk with your health care provider about your test results, treatment options, and if necessary, the need for more tests. Follow these instructions at home: Eating and drinking  Eat a diet that includes fresh fruits and vegetables, whole grains, lean protein, and low-fat dairy products.  Take vitamin and mineral supplements  as recommended by your health care provider.  Do not drink alcohol if: ? Your health care provider tells you not to drink. ? You are pregnant, may be pregnant, or are planning to become pregnant.  If you drink alcohol: ? Limit how much you have to 0-1 drink a day. ? Be aware of how much alcohol is in your drink. In the [place] one drink equals one 12 oz bottle of beer (355 mL), one 5 oz glass of  wine (148 mL), or one 1 oz glass of hard liquor (44 mL).   Lifestyle  Take daily care of your teeth and gums. Brush your teeth every morning and night with fluoride toothpaste. Floss one time each day.  Stay active. Exercise for at least 30 minutes 5 or more days each week.  Do not use any products that contain nicotine or tobacco, such as cigarettes, e-cigarettes, and chewing tobacco. If you need help quitting, ask your health care provider.  Do not use drugs.  If you are sexually active, practice safe sex. Use a condom or other form of protection to prevent STIs (sexually transmitted infections).  If you do not wish to become pregnant, use a form of birth control. If you plan to become pregnant, see your health care provider for a prepregnancy visit.  If told by your health care provider, take low-dose aspirin daily starting at age 62.  Find healthy ways to cope with stress, such as: ? Meditation, yoga, or listening to music. ? Journaling. ? Talking to a trusted person. ? Spending time with friends and family. Safety  Always wear your seat belt while driving or riding in a vehicle.  Do not drive: ? If you have been drinking alcohol. Do not ride with someone who has been drinking. ? When you are tired or distracted. ? While texting.  Wear a helmet and other protective equipment during sports activities.  If you have firearms in your house, make sure you follow all gun safety procedures. What's next?  Visit your health care provider once a year for an annual wellness visit.  Ask your health care provider how often you should have your eyes and teeth checked.  Stay up to date on all vaccines. This information is not intended to replace advice given to you by your health care provider. Make sure you discuss any questions you have with your health care provider. Document Revised: 03/30/2020 Document Reviewed: 03/07/2018 Elsevier Patient Education  2021 [employer]  

## 2020-11-30 LAB — LIPID PANEL
Cholesterol: 125 mg/dL (ref 0–200)
HDL: 37.7 mg/dL — ABNORMAL LOW (ref >39.00)
LDL Cholesterol: 63 mg/dL (ref 0–99)
NonHDL: 87.31
Total CHOL/HDL Ratio: 3
Triglycerides: 121 mg/dL (ref 0.0–149.0)
VLDL: 24.2 mg/dL (ref 0.0–40.0)

## 2020-11-30 LAB — COMPREHENSIVE METABOLIC PANEL
ALT: 29 U/L (ref 0–35)
AST: 24 U/L (ref 0–37)
Albumin: 4.2 g/dL (ref 3.5–5.2)
Alkaline Phosphatase: 89 U/L (ref 39–117)
BUN: 21 mg/dL (ref 6–23)
CO2: 32 mEq/L (ref 19–32)
Calcium: 9.7 mg/dL (ref 8.4–10.5)
Chloride: 100 mEq/L (ref 96–112)
Creatinine, Ser: 0.74 mg/dL (ref 0.40–1.20)
GFR: 87.02 mL/min (ref >60.00)
Glucose, Bld: 90 mg/dL (ref 70–99)
Potassium: 4.5 mEq/L (ref 3.5–5.1)
Sodium: 140 mEq/L (ref 135–145)
Total Bilirubin: 0.7 mg/dL (ref 0.2–1.2)
Total Protein: 6.8 g/dL (ref 6.0–8.3)

## 2020-11-30 LAB — CBC WITH DIFFERENTIAL/PLATELET
Basophils Absolute: 0.1 10*3/uL (ref 0.0–0.1)
Basophils Relative: 1.1 % (ref 0.0–3.0)
Eosinophils Absolute: 0.2 10*3/uL (ref 0.0–0.7)
Eosinophils Relative: 2.7 % (ref 0.0–5.0)
HCT: 41.5 % (ref 36.0–46.0)
Hemoglobin: 14.1 g/dL (ref 12.0–15.0)
Lymphocytes Relative: 26.8 % (ref 12.0–46.0)
Lymphs Abs: 2.2 10*3/uL (ref 0.7–4.0)
MCHC: 33.9 g/dL (ref 30.0–36.0)
MCV: 92.7 fl (ref 78.0–100.0)
Monocytes Absolute: 0.6 10*3/uL (ref 0.1–1.0)
Monocytes Relative: 6.8 % (ref 3.0–12.0)
Neutro Abs: 5.3 10*3/uL (ref 1.4–7.7)
Neutrophils Relative %: 62.6 % (ref 43.0–77.0)
Platelets: 305 10*3/uL (ref 150.0–400.0)
RBC: 4.47 Mil/uL (ref 3.87–5.11)
RDW: 12.5 % (ref 11.5–15.5)
WBC: 8.4 10*3/uL (ref 4.0–10.5)

## 2020-11-30 LAB — TSH: TSH: 1.44 u[IU]/mL (ref 0.35–4.50)

## 2020-12-06 IMAGING — CT CT ABDOMEN AND PELVIS WITH CONTRAST
2 of 5 series · 16 of 46 positions shown, 18 images · IV contrast (APPLIED)
Comparison: 05/08/2016

CLINICAL DATA: Abdominal mass

EXAM:
CT ABDOMEN AND PELVIS WITH CONTRAST
TECHNIQUE: Multidetector CT imaging of the abdomen and pelvis was performed
using the standard protocol following bolus administration of
intravenous contrast.
CONTRAST:  100mL OMNIPAQUE IOHEXOL 300 MG/ML SOLN, additional oral
enteric contrast

[Series 2: axial st · axial · 0.98mm/px · z∈[-604,-150]mm · 13 of 103 slices shown, 15 images]
[im 6/103  soft-tissue]
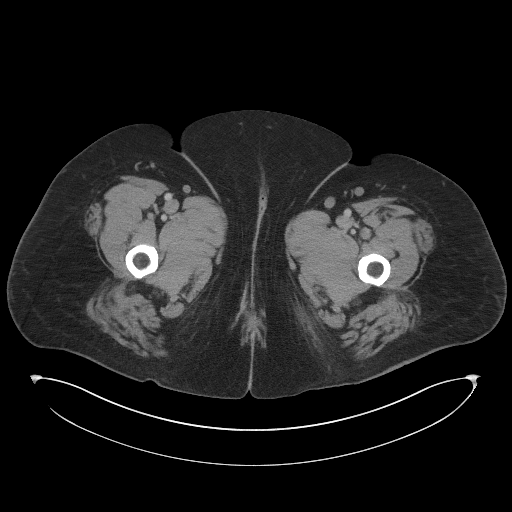
[im 6/103  bone]
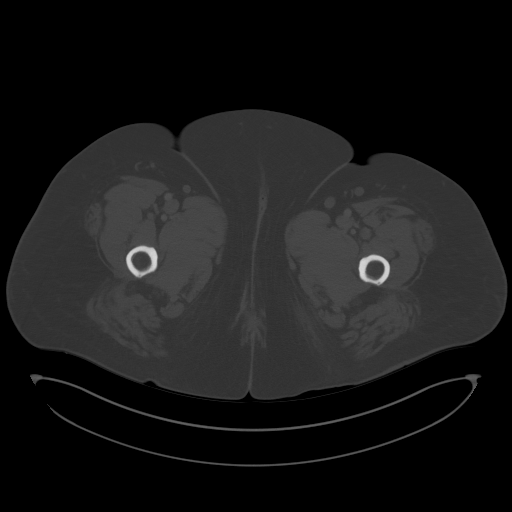
[im 12/103  soft-tissue]
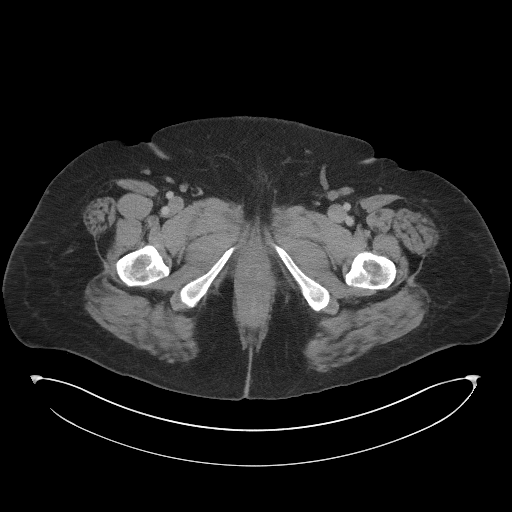
[im 23/103  soft-tissue]
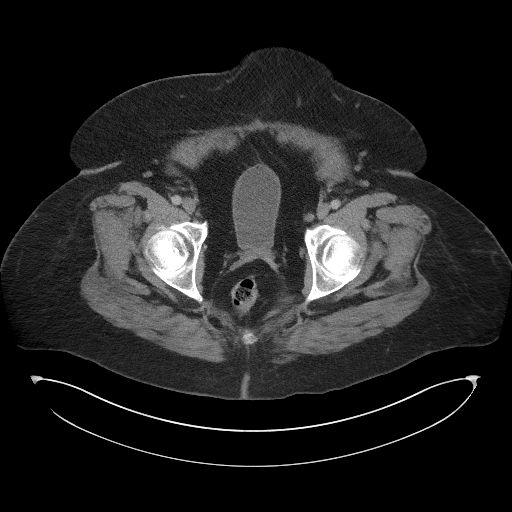
[im 29/103  soft-tissue]
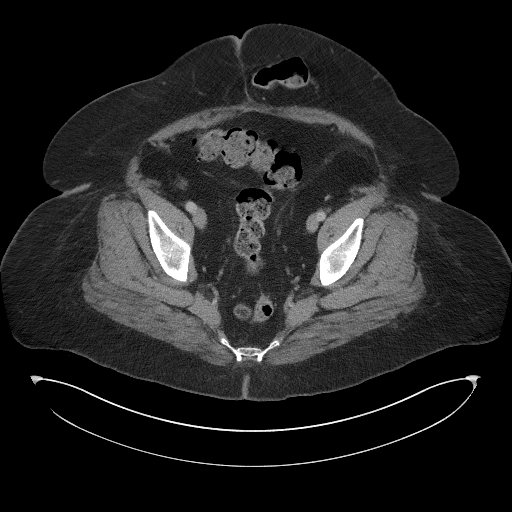
[im 35/103  soft-tissue]
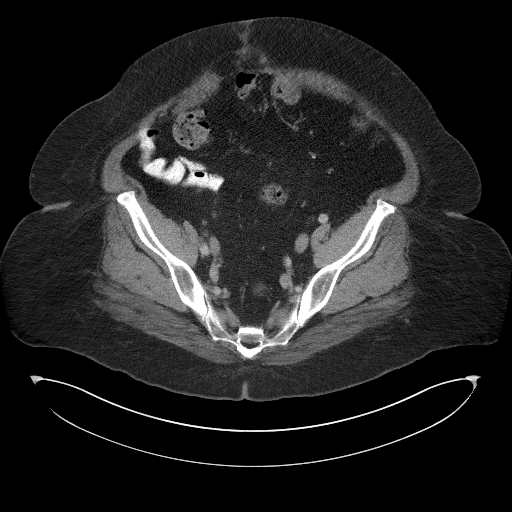
[im 46/103  soft-tissue]
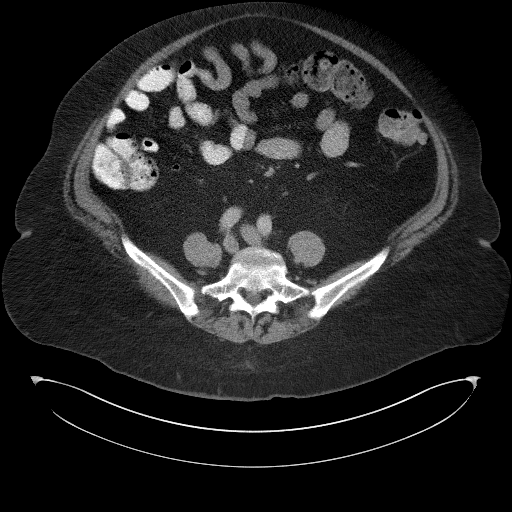
[im 52/103  soft-tissue]
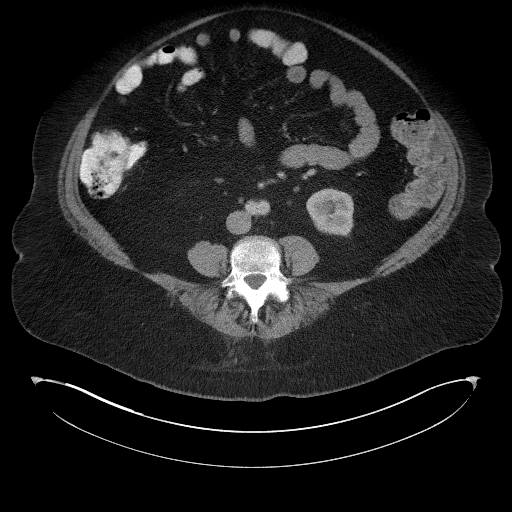
[im 57/103  soft-tissue]
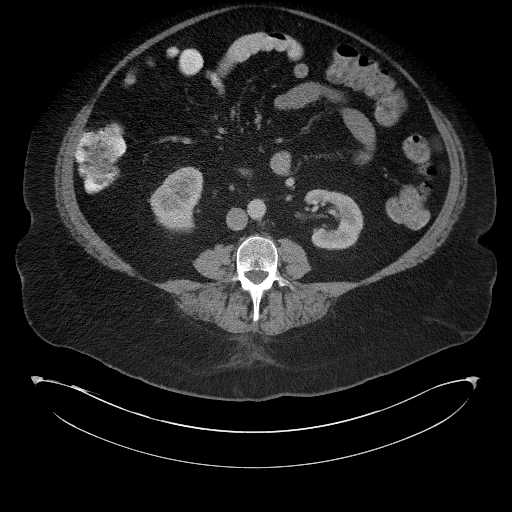
[im 69/103  soft-tissue]
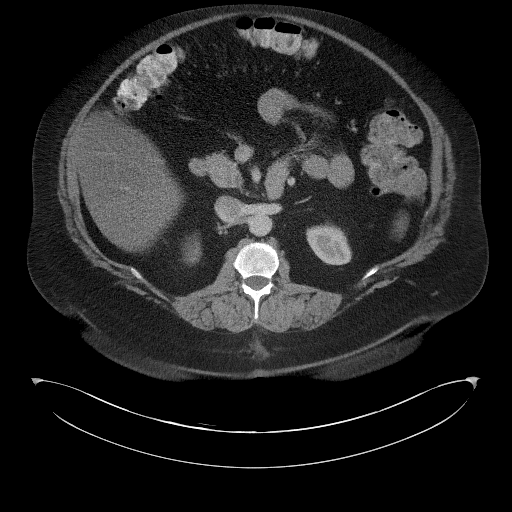
[im 69/103  bone]
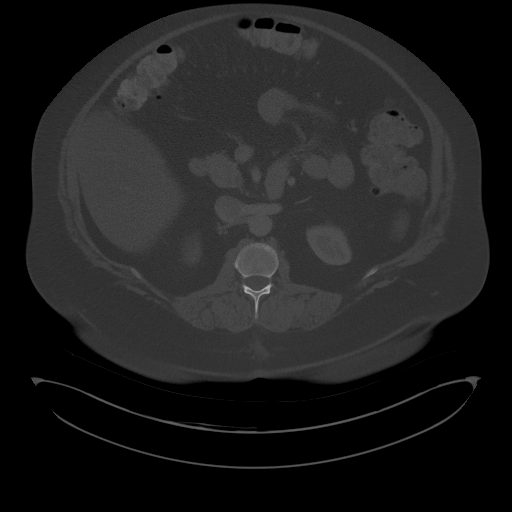
[im 74/103  soft-tissue]
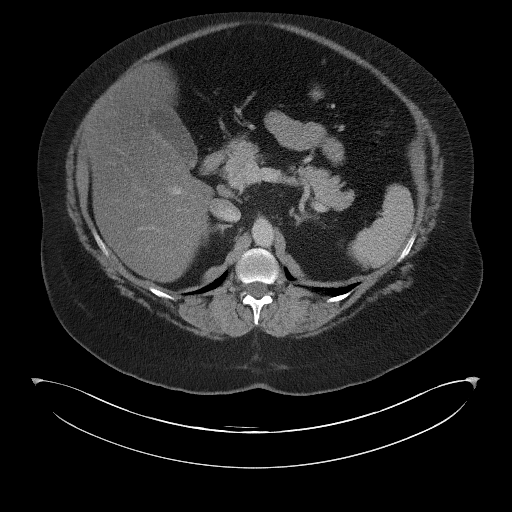
[im 80/103  soft-tissue]
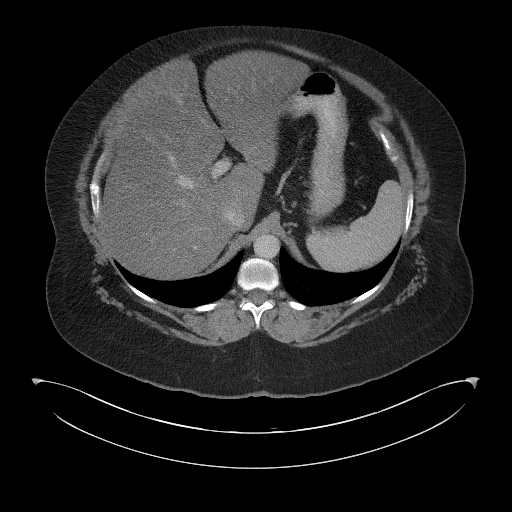
[im 91/103  soft-tissue]
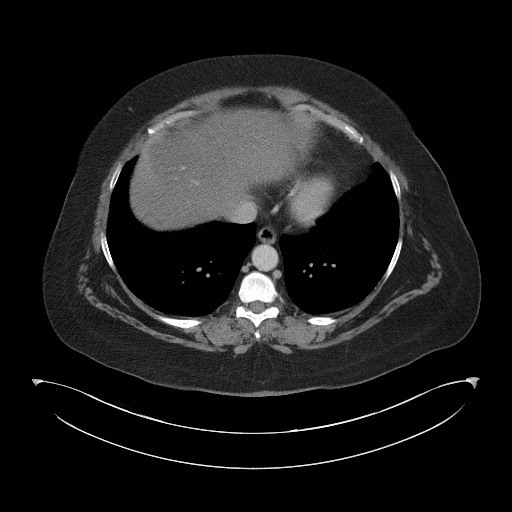
[im 97/103  soft-tissue]
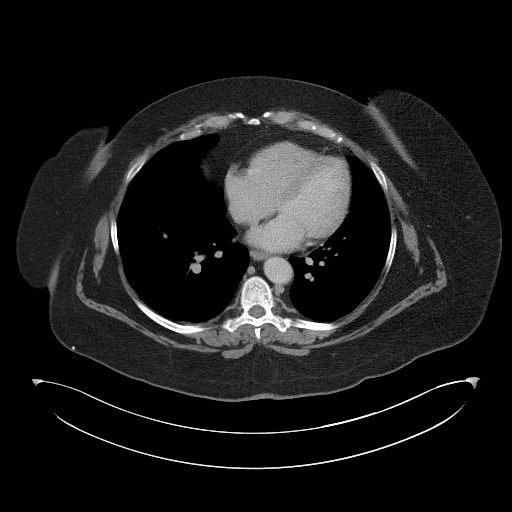

[Series 4: coronal st · coronal · 1.02mm/px · 3 of 134 slices shown]
[im 45/134  soft-tissue]
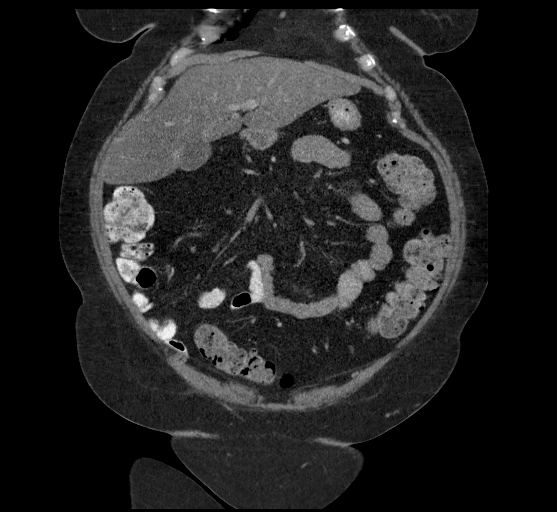
[im 60/134  soft-tissue]
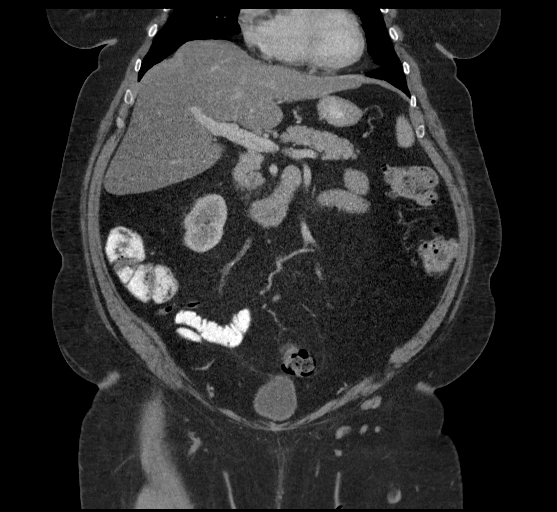
[im 74/134  soft-tissue]
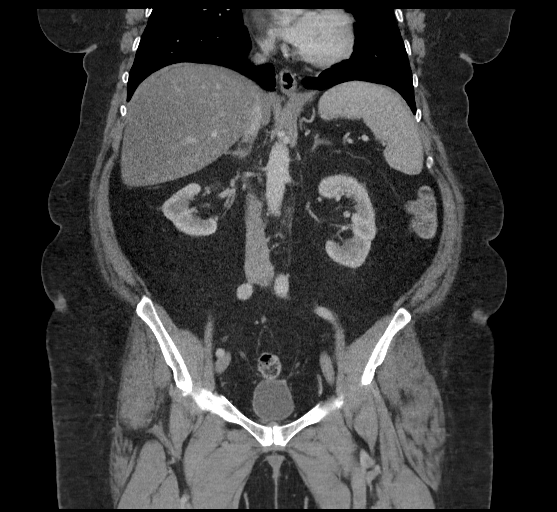

[16 of 46 positions shown; findings below may reference images not displayed]

FINDINGS: Lower chest: No acute abnormality.

Hepatobiliary: No solid liver abnormality is seen. Hepatic
steatosis. No gallstones, gallbladder wall thickening, or biliary
dilatation.

Pancreas: Unremarkable. No pancreatic ductal dilatation or
surrounding inflammatory changes.

Spleen: Normal in size without significant abnormality.

Adrenals/Urinary Tract: Adrenal glands are unremarkable. Kidneys are
normal, without renal calculi, solid lesion, or hydronephrosis.
Bladder is unremarkable.

Stomach/Bowel: Stomach is within normal limits. Appendix appears
normal. No evidence of bowel wall thickening, distention, or
inflammatory changes. Sigmoid diverticulosis.

Vascular/Lymphatic: No significant vascular findings are present. No
enlarged abdominal or pelvic lymph nodes.

Reproductive: Status post hysterectomy.

Other: There is a low ventral hernia just left of midline containing
fat and a nonobstructed loop of sigmoid colon. The hernia sac
measures 9.5 x 8.5 x 4.9 cm with a neck measuring 3.7 cm (series 5,
image 89, series 2, image 75). This is not significantly changed in
size or appearance in comparison to examination dated 05/08/2016. No
abdominopelvic ascites.

Musculoskeletal: No acute or significant osseous findings.
IMPRESSION: 1. There is a low ventral hernia just left of midline containing fat
and a nonobstructed loop of sigmoid colon. The hernia sac measures
9.5 x 8.5 x 4.9 cm with a neck measuring 3.7 cm (series 5, image 89,
series 2, image 75). This is not significantly changed in size or
appearance in comparison to examination dated 05/08/2016.

[DATE].  Status post hysterectomy.

3.  Hepatic steatosis.

4.  Sigmoid diverticulosis.

## 2021-02-07 ENCOUNTER — Other Ambulatory Visit: Payer: Self-pay | Admitting: Family Medicine

## 2021-02-07 DIAGNOSIS — E559 Vitamin D deficiency, unspecified: Secondary | ICD-10-CM

## 2021-04-30 ENCOUNTER — Other Ambulatory Visit: Payer: Self-pay | Admitting: Family Medicine

## 2021-04-30 DIAGNOSIS — E559 Vitamin D deficiency, unspecified: Secondary | ICD-10-CM

## 2021-05-05 ENCOUNTER — Other Ambulatory Visit: Payer: Self-pay | Admitting: Family Medicine

## 2021-05-05 DIAGNOSIS — Z1231 Encounter for screening mammogram for malignant neoplasm of breast: Secondary | ICD-10-CM

## 2021-05-10 ENCOUNTER — Other Ambulatory Visit: Payer: Self-pay | Admitting: Family Medicine

## 2021-05-10 DIAGNOSIS — I10 Essential (primary) hypertension: Secondary | ICD-10-CM

## 2021-05-17 ENCOUNTER — Telehealth: Payer: Self-pay | Admitting: Family Medicine

## 2021-05-17 NOTE — Telephone Encounter (Signed)
Pt stated pharmacy told her they could not fill her rx.   Medication: potassium chloride SA (KLOR-CON M20) 20 MEQ tablet  Has the patient contacted their pharmacy? Yes.     Preferred Pharmacy: [id number] - [place], [place] - [address] [place] [address]  Phone:  [phone]  Fax:  [phone]

## 2021-05-17 NOTE — Telephone Encounter (Signed)
Refill sent.

## 2021-05-23 ENCOUNTER — Emergency Department (HOSPITAL_BASED_OUTPATIENT_CLINIC_OR_DEPARTMENT_OTHER): Payer: 59

## 2021-05-23 ENCOUNTER — Emergency Department (HOSPITAL_BASED_OUTPATIENT_CLINIC_OR_DEPARTMENT_OTHER)
Admission: EM | Admit: 2021-05-23 | Discharge: 2021-05-23 | Disposition: A | Discharge status: Home / Self Care | Payer: 59 | Attending: Emergency Medicine | Admitting: Emergency Medicine

## 2021-05-23 ENCOUNTER — Telehealth: Payer: Self-pay | Admitting: Family Medicine

## 2021-05-23 ENCOUNTER — Other Ambulatory Visit: Payer: Self-pay

## 2021-05-23 ENCOUNTER — Encounter (HOSPITAL_BASED_OUTPATIENT_CLINIC_OR_DEPARTMENT_OTHER): Payer: Self-pay | Admitting: Urology

## 2021-05-23 DIAGNOSIS — Z79899 Other long term (current) drug therapy: Secondary | ICD-10-CM | POA: Diagnosis not present

## 2021-05-23 DIAGNOSIS — I10 Essential (primary) hypertension: Secondary | ICD-10-CM | POA: Diagnosis not present

## 2021-05-23 DIAGNOSIS — J45909 Unspecified asthma, uncomplicated: Secondary | ICD-10-CM | POA: Diagnosis not present

## 2021-05-23 DIAGNOSIS — R079 Chest pain, unspecified: Secondary | ICD-10-CM | POA: Insufficient documentation

## 2021-05-23 DIAGNOSIS — R519 Headache, unspecified: Secondary | ICD-10-CM | POA: Insufficient documentation

## 2021-05-23 LAB — CBC
HCT: 41.9 % (ref 36.0–46.0)
Hemoglobin: 14.2 g/dL (ref 12.0–15.0)
MCH: 32 pg (ref 26.0–34.0)
MCHC: 33.9 g/dL (ref 30.0–36.0)
MCV: 94.4 fL (ref 80.0–100.0)
Platelets: 303 10*3/uL (ref 150–400)
RBC: 4.44 MIL/uL (ref 3.87–5.11)
RDW: 12.2 % (ref 11.5–15.5)
WBC: 7.4 10*3/uL (ref 4.0–10.5)
nRBC: 0 % (ref 0.0–0.2)

## 2021-05-23 LAB — BASIC METABOLIC PANEL
Anion gap: 6 (ref 5–15)
BUN: 18 mg/dL (ref 8–23)
CO2: 28 mmol/L (ref 22–32)
Calcium: 9.5 mg/dL (ref 8.9–10.3)
Chloride: 103 mmol/L (ref 98–111)
Creatinine, Ser: 0.79 mg/dL (ref 0.44–1.00)
GFR, Estimated: >60 mL/min (ref >60)
Glucose, Bld: 93 mg/dL (ref 70–99)
Potassium: 3.6 mmol/L (ref 3.5–5.1)
Sodium: 137 mmol/L (ref 135–145)

## 2021-05-23 LAB — TROPONIN I (HIGH SENSITIVITY)
Troponin I (High Sensitivity): 4 ng/L (ref <18)
Troponin I (High Sensitivity): 3 ng/L (ref <18)

## 2021-05-23 NOTE — Discharge Instructions (Signed)
Call your primary care doctor or specialist as discussed in the next 2-3 days.   Return immediately back to the ER if:  Your symptoms worsen within the next 12-24 hours. You develop new symptoms such as new fevers, persistent vomiting, new pain, shortness of breath, or new weakness or numbness, or if you have any other concerns.  

## 2021-05-23 NOTE — Telephone Encounter (Signed)
Patient in ER now  

## 2021-05-23 NOTE — Telephone Encounter (Signed)
Pt stated she woke up with chest pain and head pain on her right side last night. She stated she was fine today, but someone at her work advised her to call her primary care dr. She was triaged to seek further advisement.

## 2021-05-23 NOTE — ED Triage Notes (Signed)
Chest pressure that started this morning, right sided HA, took baba asa and ibuprofen.  States pressure in mid chest.

## 2021-05-23 NOTE — Telephone Encounter (Signed)
Who Is Calling Patient / Member / Family / Caregiver Call Type Triage / Clinical Relationship To Patient Self Return Phone Number [phone] (Primary) Chief Complaint Health information question (non symptomatic) Reason for Call Symptomatic / Request for [place] states, pt had many symptoms- gallbladder attack, currently having no symptoms. Pt has prev. chest pain with gas. [place] Not Listed [facility] (in builiding with office) Translation No Nurse Assessment Nurse: Lucky Cowboy, RN, Levada Dy Date/Time (Eastern Time): 05/23/2021 10:38:00 AM Confirm and document reason for call. If symptomatic, describe symptoms. ---Caller stated that she woke up at 4 am with chest pressure, clammy, nausea, rt side headache, rt jaw pain, and nausea. She stated that she was gassy and felt better after some OTC meds. Does the patient have any new or worsening symptoms? ---Yes Will a triage be completed? ---Yes Related visit to physician within the last 2 weeks? ---No Does the PT have any chronic conditions? (i.e. diabetes, asthma, this includes High risk factors for pregnancy, etc.) ---Yes List chronic conditions. ---HTN, Vitamin D, magnesium, anxiety/depression Is this a behavioral health or substance abuse call? ---No Guidelines Guideline Title Affirmed Question Affirmed Notes Nurse Date/Time (Eastern Time) Chest Pain [1] Chest pain lasts > 5 minutes AND [2] occurred in past 3 days (72 hours) (Exception: feels exactly the same as previously diagnosed heartburn and has Lucky Cowboy, RN, Levada Dy 05/23/2021 10:40:32 AM PLEASE NOTE: All timestamps contained within this report are represented as [place] Standard Time. CONFIDENTIALTY NOTICE: This fax transmission is intended only for the addressee. It contains information that is legally privileged, confidential or otherwise protected from use or disclosure. If you are not the intended recipient, you are strictly  prohibited from reviewing, disclosing, copying using or disseminating any of this information or taking any action in reliance on or regarding this information. If you have received this fax in error, please notify us immediately by telephone so that we can arrange for its return to [place]. Phone: [phone], Toll-Free: [phone], Fax: [phone] Page: 2 of 2 Call Id: [id number] Guidelines Guideline Title Affirmed Question Affirmed Notes Nurse Date/Time Eilene Ghazi Time) accompanying sour taste in mouth) Disp. Time Eilene Ghazi Time) Disposition Final User 05/23/2021 10:09:42 AM Attempt made - no message left Dew, RN, Levada Dy 05/23/2021 10:44:54 AM Go to ED Now (or PCP triage) Yes Dew, RN, Levada Dy

## 2021-05-23 NOTE — ED Provider Notes (Signed)
[facility] EMERGENCY DEPARTMENT Provider Note   CSN: [id number] Arrival date & time: 05/23/21  1138     History Chief Complaint  Patient presents with   Chest Pain    Audrey Duncan is a 60 y.o. female.  Patient presents ER chief complaint of chest pain describes it as sharp mid chest pain that woke her up early in the morning.  Radiated to her right jaw.  She also states that she had a headache described as moderate.  She took some Tylenol and aspirin with improvement of symptoms.  Advised by her doctor to go to the ER for evaluation.  Describes the chest pain as a pressure-like sensation currently resolved.      Past Medical History:  Diagnosis Date   Asthma    Chiari malformation type II ([place])    diagnosed when in high school   Chiari malformation type II ([place])    Colon polyp 2006   (TUBULAR ADENOMA)--COLONOSCOPY-DR. HAYES   Constipation    Fluid retention    H/O seasonal allergies    Medial meniscus tear    left   Over weight    Ulcerative colitis ([place])     Patient Active Problem List   Diagnosis Date Noted   Preventative health care 11/29/2020   Depression 08/12/2018   Insulin resistance 07/25/2018   Vitamin D deficiency 09/25/2017   Prediabetes 09/25/2017   Shortness of breath on exertion 09/10/2017   Other fatigue 09/10/2017   Hyperglycemia 09/10/2017   Chronic constipation 09/10/2017   Class 3 severe obesity with serious comorbidity and body mass index (BMI) of 45.0 to 49.9 in adult ([place]) 09/10/2017   Severe obesity (BMI >= 40) ([place]) 06/12/2014   Lung nodule 11/19/2012   MORBID OBESITY 03/28/2010   POSTMENOPAUSAL STATUS 03/28/2010   Asthma, episodic with mucus plugs 06/14/2009   HYPOKALEMIA 02/21/2008   Essential hypertension 09/06/2007    Past Surgical History:  Procedure Laterality Date   ABDOMINAL HYSTERECTOMY  1999   complete   CESAREAN SECTION  1995 and [place]  9528   umbilical     OB History      Gravida  2   Para  2   Term      Preterm      AB      Living  2      SAB      IAB      Ectopic      Multiple      Live Births              Family History  Problem Relation Age of Onset   Heart disease Mother 55       2 MIs    Stroke Mother 34   Hypertension Mother    Cancer Mother        lung, lymphoma   Lung cancer Mother        was a smoker   Kidney disease Mother    Obesity Mother    COPD Father        smoker   Cancer Father        colon, lung prostate   Alcohol abuse Father    Hypertension Father    Obesity Father    Asthma Sister    Alcohol abuse Brother    Diabetes Paternal Uncle    Breast cancer Neg Hx     Social History   Tobacco Use   Smoking status: Never  Smokeless tobacco: Never  Vaping Use   Vaping Use: Never used  Substance Use Topics   Alcohol use: Yes    Comment: 1 glass of wine 1-2 x a month   Drug use: No    Home Medications Prior to Admission medications   Medication Sig Start Date End Date Taking? Authorizing Provider  potassium chloride SA (KLOR-CON M20) 20 MEQ tablet TAKE 1 TABLET(20 MEQ) BY MOUTH DAILY. 05/17/21   [name], DO  buPROPion (WELLBUTRIN SR) 150 MG 12 hr tablet Take 1 tablet (150 mg total) by mouth daily. 11/29/20   [name], DO  hydrochlorothiazide (HYDRODIURIL) 25 MG tablet Take 1 tablet (25 mg total) by mouth daily. Pt needs OV for further refills 11/29/20   Carollee Herter, Kendrick Fries R, DO  loratadine (CLARITIN) 10 MG tablet Take 10 mg by mouth daily.    [provider]  Magnesium 250 MG TABS Take 1 tablet by mouth daily.    [provider]  meloxicam (MOBIC) 15 MG tablet Take 0.5-1 tablets (7.5-15 mg total) by mouth daily as needed for pain. 11/29/20   Roma Schanz R, DO  polyethylene glycol powder (GLYCOLAX/MIRALAX) powder Take 17 g by mouth daily. Patient taking differently: Take 17 g by mouth daily as needed. 09/10/17   Dennard Nip D, MD  Probiotic Product  (ALIGN PO) Take 1 capsule by mouth daily.    [provider]  Vitamin D, Ergocalciferol, (DRISDOL) 1.25 MG (50000 UNIT) CAPS capsule TAKE 1 CAPSULE (50,000 UNITS TOTAL) BY MOUTH EVERY 7 (SEVEN) DAYS 05/02/21   [name], DO    Allergies    Patient has no known allergies.  Review of Systems   Review of Systems  Constitutional:  Negative for fever.  HENT:  Negative for ear pain.   Eyes:  Negative for pain.  Respiratory:  Negative for cough.   Cardiovascular:  Positive for chest pain.  Gastrointestinal:  Negative for abdominal pain.  Genitourinary:  Negative for flank pain.  Musculoskeletal:  Negative for back pain.  Skin:  Negative for rash.  Neurological:  Negative for seizures.   Physical Exam Updated Vital Signs BP 104/79   Pulse 66   Temp 98 F (36.7 C) (Oral)   Resp 18   Ht 5\' 4"  (1.626 m)   Wt 110.7 kg   SpO2 97%   BMI 41.89 kg/m   Physical Exam Constitutional:      General: She is not in acute distress.    Appearance: Normal appearance.  HENT:     Head: Normocephalic.     Nose: Nose normal.  Eyes:     Extraocular Movements: Extraocular movements intact.  Cardiovascular:     Rate and Rhythm: Normal rate.  Pulmonary:     Effort: Pulmonary effort is normal.  Abdominal:     Tenderness: There is no abdominal tenderness. There is no guarding or rebound.  Musculoskeletal:        General: Normal range of motion.     Cervical back: Normal range of motion.  Neurological:     General: No focal deficit present.     Mental Status: She is alert and oriented to person, place, and time. Mental status is at baseline.     Cranial Nerves: No cranial nerve deficit.    ED Results / Procedures / Treatments   Labs (all labs ordered are listed, but only abnormal results are displayed) Labs Reviewed  BASIC METABOLIC PANEL  CBC  TROPONIN  I (HIGH SENSITIVITY)  TROPONIN I (HIGH SENSITIVITY)    EKG EKG Interpretation  Date/Time:  Monday May 23 2021 11:47:07 EST Ventricular Rate:  75 PR Interval:  180 QRS Duration: 90 QT Interval:  370 QTC Calculation: 413 R Axis:   64 Text Interpretation: Normal sinus rhythm Low voltage QRS Borderline ECG Confirmed by Thamas Jaegers ([id number]) on 05/23/2021 12:01:10 PM  Radiology DG Chest 2 View  Result Date: 05/23/2021 CLINICAL DATA:  Chest pain EXAM: CHEST - 2 VIEW COMPARISON:  11/19/2012, 02/25/2013 FINDINGS: The heart size and mediastinal contours are within normal limits. Chronic right basilar opacity most compatible with a area of chronic mucoid impaction in the right lower lobe is unchanged in appearance from remote priors. Elsewhere, lungs appear clear. No pleural effusion or pneumothorax. The visualized skeletal structures are unremarkable. IMPRESSION: No active cardiopulmonary disease. Chronic right lower lobe mucoid impaction, unchanged from remote priors. Electronically Signed   By: Davina Poke D.O.   On: 05/23/2021 12:12    Procedures Procedures   Medications Ordered in ED Medications - No data to display  ED Course  I have reviewed the triage vital signs and the nursing notes.  Pertinent labs & imaging results that were available during my care of the patient were reviewed by me and considered in my medical decision making (see chart for details).    MDM Rules/Calculators/A&P                           EKG appears to be sinus rhythm no ST elevations no T wave inversions or depressions noted.  Rate is normal.  Troponins are sent x2 and negative.  Imaging is unremarkable as well.  Patient continues to be asymptomatic at this time feeling much better.  Given her risk factors, advising outpatient follow-up with cardiology within the week.  Recommending immediate return for worsening pain fevers or any additional concerns.  Final Clinical Impression(s) / ED Diagnoses Final diagnoses:  Nonspecific chest pain    Rx / DC Orders ED Discharge Orders     None        Luna Fuse, MD 05/23/21 1425

## 2021-06-20 ENCOUNTER — Ambulatory Visit
Admission: RE | Admit: 2021-06-20 | Discharge: 2021-06-20 | Disposition: A | Discharge status: Home / Self Care | Payer: 59 | Source: Ambulatory Visit | Attending: Family Medicine | Admitting: Family Medicine

## 2021-06-20 DIAGNOSIS — Z1231 Encounter for screening mammogram for malignant neoplasm of breast: Secondary | ICD-10-CM

## 2021-07-19 ENCOUNTER — Telehealth (INDEPENDENT_AMBULATORY_CARE_PROVIDER_SITE_OTHER): Payer: 59 | Admitting: Family Medicine

## 2021-07-19 ENCOUNTER — Other Ambulatory Visit: Payer: Self-pay

## 2021-07-19 ENCOUNTER — Encounter: Payer: Self-pay | Admitting: Family Medicine

## 2021-07-19 VITALS — Temp 97.4°F

## 2021-07-19 DIAGNOSIS — H029 Unspecified disorder of eyelid: Secondary | ICD-10-CM | POA: Diagnosis not present

## 2021-07-19 MED ORDER — ERYTHROMYCIN 5 MG/GM OP OINT: 1.0000 "application " | TOPICAL_OINTMENT | Freq: Three times a day (TID) | OPHTHALMIC | 0 refills | Status: DC

## 2021-07-19 NOTE — Patient Instructions (Signed)
-I sent the medication(s) we discussed to your pharmacy: Meds ordered this encounter  Medications   erythromycin ophthalmic ointment    Sig: Place 1 application into the left eye 3 (three) times daily.    Dispense:  3.5 g    Refill:  0     I hope you are feeling better soon!  Seek in person care promptly if your symptoms worsen, new concerns arise or you are not improving with treatment.  It was nice to meet you today. I help [facility] out with telemedicine visits on Tuesdays and Thursdays and am happy to help if you need a virtual follow up visit on those days. Otherwise, if you have any concerns or questions following this visit please schedule a follow up visit with your Primary Care office or seek care at a local urgent care clinic to avoid delays in care   Stye A stye, also known as a hordeolum, is a bump that forms on an eyelid. It may look like a pimple next to the eyelash. A stye can form inside the eyelid (internal stye) or outside the eyelid (external stye). A stye can cause redness, swelling, and pain on the eyelid. Styes are very common. Anyone can get them at any age. They usually occur in just one eye at a time, but you may have more than one in either eye. What are the causes? A stye is caused by an infection. The infection is almost always caused by bacteria called Staphylococcus aureus. This is a common type of bacteria that lives on the skin. An internal stye may result from an infected oil-producing gland inside the eyelid. An external stye may be caused by an infection at the base of the eyelash (hair follicle). What increases the risk? You are more likely to develop a stye if: You have had a stye before. You have any of these conditions: Red, itchy, inflamed eyelids (blepharitis). A skin condition such as seborrheic dermatitis or rosacea. High fat levels in your blood (lipids). Dry eyes. What are the signs or symptoms? The most common symptom of a stye is eyelid  pain. Internal styes are more painful than external styes. Other symptoms may include: Painful swelling of your eyelid. A scratchy feeling in your eye. Tearing and redness of your eye. A pimple-like bump on the edge of the eyelid. Pus draining from the stye. How is this diagnosed? Your health care provider may be able to diagnose a stye just by examining your eye. The health care provider may also check to make sure: You do not have a fever or other signs of a more serious infection. The infection has not spread to other parts of your eye or areas around your eye. How is this treated? Most styes will clear up in a few days without treatment or with warm compresses applied to the area. You may need to use antibiotic drops or ointment to treat an infection. Sometimes, steroid drops or ointment are used in addition to antibiotics. In some cases, your health care provider may give you a small steroid injection in the eyelid. If your stye does not heal with routine treatment, your health care provider may drain pus from the stye using a thin blade or needle. This may be done if the stye is large, causing a lot of pain, or affecting your vision. Follow these instructions at home: Take over-the-counter and prescription medicines only as told by your health care provider. This includes eye drops or ointments. If you  were prescribed an antibiotic medicine, steroid medicine, or both, apply or use them as told by your health care provider. Do not stop using the medicine even if your condition improves. Apply a warm, wet cloth (warm compress) to your eye for 5-10 minutes, 4 to 6 times a day. Clean the affected eyelid as directed by your health care provider. Do not wear contact lenses or eye makeup until your stye has healed and your health care provider says that it is safe. Do not try to pop or drain the stye. Do not rub your eye. Contact a health care provider if: You have chills or a fever. Your stye  does not go away after several days. Your stye affects your vision. Your eyeball becomes swollen, red, or painful. Get help right away if: You have pain when moving your eye around. Summary A stye is a bump that forms on an eyelid. It may look like a pimple next to the eyelash. A stye can form inside the eyelid (internal stye) or outside the eyelid (external stye). A stye can cause redness, swelling, and pain on the eyelid. Your health care provider may be able to diagnose a stye just by examining your eye. Apply a warm, wet cloth (warm compress) to your eye for 5-10 minutes, 4 to 6 times a day. This information is not intended to replace advice given to you by your health care provider. Make sure you discuss any questions you have with your health care provider. Document Revised: 09/01/2020 Document Reviewed: 09/01/2020 Elsevier Patient Education  [facility].

## 2021-07-19 NOTE — Progress Notes (Signed)
Virtual Visit via Video Note  I connected with Audrey Duncan  on 07/19/21 at  5:20 PM EST by a video enabled telemedicine application and verified that I am speaking with the correct person using two identifiers.  Location patient:  Location provider:work or home office Persons participating in the virtual visit: patient, provider  I discussed the limitations and requested verbal permission for telemedicine visit. The patient expressed understanding and agreed to proceed.   HPI:  Acute telemedicine visit for a "stye": -Onset: started last week -Symptoms include: bump on the L lower eyelid, irritation/pruritis of the lid, some drainage -Denies: vision changes, fevers, eye pain -Pertinent past medical history: see below -Pertinent medication allergies:No Known Allergies   ROS: See pertinent positives and negatives per HPI.  Past Medical History:  Diagnosis Date   Asthma    Chiari malformation type II ([place])    diagnosed when in high school   Chiari malformation type II ([place])    Colon polyp 2006   (TUBULAR ADENOMA)--COLONOSCOPY-DR. HAYES   Constipation    Fluid retention    H/O seasonal allergies    Medial meniscus tear    left   Over weight    Ulcerative colitis ([place])     Past Surgical History:  Procedure Laterality Date   ABDOMINAL HYSTERECTOMY  1999   complete   [place]  8099   umbilical     Current Outpatient Medications:    buPROPion (WELLBUTRIN SR) 150 MG 12 hr tablet, Take 1 tablet (150 mg total) by mouth daily., Disp: 90 tablet, Rfl: 3   erythromycin ophthalmic ointment, Place 1 application into the left eye 3 (three) times daily., Disp: 3.5 g, Rfl: 0   hydrochlorothiazide (HYDRODIURIL) 25 MG tablet, Take 1 tablet (25 mg total) by mouth daily. Pt needs OV for further refills, Disp: 90 tablet, Rfl: 3   loratadine (CLARITIN) 10 MG tablet, Take 10 mg by mouth daily., Disp: , Rfl:    Magnesium 250 MG TABS, Take 1 tablet by mouth  daily., Disp: , Rfl:    meloxicam (MOBIC) 15 MG tablet, Take 0.5-1 tablets (7.5-15 mg total) by mouth daily as needed for pain., Disp: 90 tablet, Rfl: 3   polyethylene glycol powder (GLYCOLAX/MIRALAX) powder, Take 17 g by mouth daily. (Patient taking differently: Take 17 g by mouth daily as needed.), Disp: 3350 g, Rfl: 0   potassium chloride SA (KLOR-CON M20) 20 MEQ tablet, TAKE 1 TABLET(20 MEQ) BY MOUTH DAILY., Disp: 90 tablet, Rfl: 1   Probiotic Product (ALIGN PO), Take 1 capsule by mouth daily., Disp: , Rfl:    Vitamin D, Ergocalciferol, (DRISDOL) 1.25 MG (50000 UNIT) CAPS capsule, TAKE 1 CAPSULE (50,000 UNITS TOTAL) BY MOUTH EVERY 7 (SEVEN) DAYS, Disp: 12 capsule, Rfl: 1  Current Facility-Administered Medications:    0.9 %  sodium chloride infusion, 500 mL, Intravenous, Continuous, Danis, Kirke Corin, MD  EXAM:  VITALS per patient if applicable:  GENERAL: alert, oriented, appears well and in no acute distress  HEENT: atraumatic, conjunttiva clear, no obvious abnormalities on inspection of external nose and ears, PER, EOMI, erythematous dome shaped lesion L lower eyelid, no purulent eye drainage appreciated on limited video visit exam  NECK: normal movements of the head and neck  LUNGS: on inspection no signs of respiratory distress, breathing rate appears normal, no obvious gross SOB, gasping or wheezing  CV: no obvious cyanosis  MS: moves all visible extremities without noticeable abnormality  PSYCH/NEURO: pleasant  and cooperative, no obvious depression or anxiety, speech and thought processing grossly intact  ASSESSMENT AND PLAN:  Discussed the following assessment and plan:  Eyelid lesion  -we discussed possible serious and likely etiologies, options for evaluation and workup, limitations of telemedicine visit vs in person visit, treatment, treatment risks and precautions. Pt is agreeable to treatment via telemedicine at this moment. Suspect Stye. She opted to try compresses,  erythro ointment and agrees to see eye doctor or seek person care if worsening, new symptoms arise, or if is not improving with treatment as expected per our conversation of expected course. Discussed options for follow up care. Did let this patient know that I do telemedicine on Tuesdays and Thursdays for [facility] and those are the days I am logged into the system. Advised to schedule follow up visit with PCP, [facility] virtual visits or [facility] if any further questions or concerns to avoid delays in care.   I discussed the assessment and treatment plan with the patient. The patient was provided an opportunity to ask questions and all were answered. The patient agreed with the plan and demonstrated an understanding of the instructions.     Audrey Kern, DO

## 2021-10-21 ENCOUNTER — Other Ambulatory Visit: Payer: Self-pay | Admitting: Family Medicine

## 2021-10-21 DIAGNOSIS — E559 Vitamin D deficiency, unspecified: Secondary | ICD-10-CM

## 2021-10-26 ENCOUNTER — Other Ambulatory Visit: Payer: Self-pay | Admitting: Family Medicine

## 2021-10-26 DIAGNOSIS — F3289 Other specified depressive episodes: Secondary | ICD-10-CM

## 2021-11-22 ENCOUNTER — Other Ambulatory Visit: Payer: Self-pay | Admitting: Family Medicine

## 2021-11-22 DIAGNOSIS — F3289 Other specified depressive episodes: Secondary | ICD-10-CM

## 2021-12-13 ENCOUNTER — Encounter: Payer: Self-pay | Admitting: Family Medicine

## 2021-12-13 ENCOUNTER — Other Ambulatory Visit (HOSPITAL_BASED_OUTPATIENT_CLINIC_OR_DEPARTMENT_OTHER): Payer: Self-pay

## 2021-12-13 ENCOUNTER — Telehealth: Payer: Self-pay | Admitting: *Deleted

## 2021-12-13 ENCOUNTER — Ambulatory Visit (INDEPENDENT_AMBULATORY_CARE_PROVIDER_SITE_OTHER): Payer: 59 | Admitting: Family Medicine

## 2021-12-13 VITALS — BP 124/82 | HR 79 | Temp 98.0°F | Resp 18 | Ht 64.0 in | Wt 248.0 lb

## 2021-12-13 DIAGNOSIS — E559 Vitamin D deficiency, unspecified: Secondary | ICD-10-CM

## 2021-12-13 DIAGNOSIS — Z0000 Encounter for general adult medical examination without abnormal findings: Secondary | ICD-10-CM | POA: Diagnosis not present

## 2021-12-13 DIAGNOSIS — I10 Essential (primary) hypertension: Secondary | ICD-10-CM | POA: Diagnosis not present

## 2021-12-13 LAB — LIPID PANEL
Cholesterol: 168 mg/dL (ref 0–200)
HDL: 58.5 mg/dL (ref >39.00)
LDL Cholesterol: 94 mg/dL (ref 0–99)
NonHDL: 109.21
Total CHOL/HDL Ratio: 3
Triglycerides: 74 mg/dL (ref 0.0–149.0)
VLDL: 14.8 mg/dL (ref 0.0–40.0)

## 2021-12-13 LAB — COMPREHENSIVE METABOLIC PANEL
ALT: 22 U/L (ref 0–35)
AST: 20 U/L (ref 0–37)
Albumin: 4.2 g/dL (ref 3.5–5.2)
Alkaline Phosphatase: 80 U/L (ref 39–117)
BUN: 20 mg/dL (ref 6–23)
CO2: 30 mEq/L (ref 19–32)
Calcium: 9.8 mg/dL (ref 8.4–10.5)
Chloride: 102 mEq/L (ref 96–112)
Creatinine, Ser: 0.71 mg/dL (ref 0.40–1.20)
GFR: 90.79 mL/min (ref >60.00)
Glucose, Bld: 86 mg/dL (ref 70–99)
Potassium: 4 mEq/L (ref 3.5–5.1)
Sodium: 140 mEq/L (ref 135–145)
Total Bilirubin: 1 mg/dL (ref 0.2–1.2)
Total Protein: 6.7 g/dL (ref 6.0–8.3)

## 2021-12-13 LAB — CBC WITH DIFFERENTIAL/PLATELET
Basophils Absolute: 0.1 10*3/uL (ref 0.0–0.1)
Basophils Relative: 1.5 % (ref 0.0–3.0)
Eosinophils Absolute: 0.2 10*3/uL (ref 0.0–0.7)
Eosinophils Relative: 3 % (ref 0.0–5.0)
HCT: 40.5 % (ref 36.0–46.0)
Hemoglobin: 13.7 g/dL (ref 12.0–15.0)
Lymphocytes Relative: 24.7 % (ref 12.0–46.0)
Lymphs Abs: 1.5 10*3/uL (ref 0.7–4.0)
MCHC: 33.7 g/dL (ref 30.0–36.0)
MCV: 94.3 fl (ref 78.0–100.0)
Monocytes Absolute: 0.4 10*3/uL (ref 0.1–1.0)
Monocytes Relative: 6.9 % (ref 3.0–12.0)
Neutro Abs: 3.9 10*3/uL (ref 1.4–7.7)
Neutrophils Relative %: 63.9 % (ref 43.0–77.0)
Platelets: 261 10*3/uL (ref 150.0–400.0)
RBC: 4.3 Mil/uL (ref 3.87–5.11)
RDW: 12.4 % (ref 11.5–15.5)
WBC: 6.2 10*3/uL (ref 4.0–10.5)

## 2021-12-13 LAB — TSH: TSH: 1.58 u[IU]/mL (ref 0.35–5.50)

## 2021-12-13 LAB — VITAMIN D 25 HYDROXY (VIT D DEFICIENCY, FRACTURES): VITD: 57.85 ng/mL (ref 30.00–100.00)

## 2021-12-13 MED ORDER — WEGOVY 0.25 MG/0.5ML ~~LOC~~ SOAJ
0.2500 mg | SUBCUTANEOUS | 0 refills | Status: DC
  Filled 2021-12-13 – 2022-02-16 (×2): qty 2, 28d supply, fill #0

## 2021-12-13 NOTE — Progress Notes (Signed)
Subjective:   By signing my name below, I, Shehryar Baig, attest that this documentation has been prepared under the direction and in the presence of [name], DO  12/13/2021      Patient ID: Audrey Duncan, female    DOB: [birth year], 61 y.o.   MRN: [id number]  Chief Complaint  Patient presents with   Annual Exam    Pt states fasting     HPI Patient is in today for a comprehensive physical exam.  She recently seen her dermatologist and reports no new issues.   She denies having any fever, ear pain, muscle pain, new joint pain, new moles, congestion, sinus pain, sore throat, eye pain, chest pain, palpations, cough, SOB, wheezing, n/v/d, constipation, blood in stool, dysuria, frequency, hematuria, or headaches at this time. She has no changes in family medical history. She drinks 1 glass of wine monthly. She reports drinking slightly more often since she started her octavia diet. She is trying to lose weight at this time and is interested in trying medication to assist her lose weight. She is participates in regular exercise by riding bikes and walking. She also has a stationary cycle under her desk in her workplace that she uses. She does not have the shingles vaccine. During last visit she did not want the vaccine due to not contracting chicken pox as a child.  She is UTD on vision care.  She is UTD on dental care.    Past Medical History:  Diagnosis Date   Asthma    Chiari malformation type II ([place])    diagnosed when in high school   Chiari malformation type II ([place])    Colon polyp 2006   (TUBULAR ADENOMA)--COLONOSCOPY-DR. HAYES   Constipation    Fluid retention    H/O seasonal allergies    Medial meniscus tear    left   Over weight    Ulcerative colitis ([place])     Past Surgical History:  Procedure Laterality Date   ABDOMINAL HYSTERECTOMY  1999   complete   CESAREAN SECTION  1995 and [place]  6599   umbilical    Family History   Problem Relation Age of Onset   Heart disease Mother 72       2 MIs    Stroke Mother 24   Hypertension Mother    Cancer Mother        lung, lymphoma   Lung cancer Mother        was a smoker   Kidney disease Mother    Obesity Mother    COPD Father        smoker   Cancer Father        colon, lung prostate   Alcohol abuse Father    Hypertension Father    Obesity Father    Asthma Sister    Alcohol abuse Brother    Diabetes Paternal Uncle    Breast cancer Neg Hx     Social History   Socioeconomic History   Marital status: Married    Spouse name: Lissa Hoard   Number of children: 2   Years of education: Not on file   Highest education level: Not on file  Occupational History    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  Tobacco Use   Smoking status: Never   Smokeless tobacco: Never  Vaping Use   Vaping Use: Never used  Substance and Sexual Activity   Alcohol use: Yes    Comment: 1 glass of  wine 1-2 x a month   Drug use: No   Sexual activity: Yes    Partners: Male    Birth control/protection: Surgical  Other Topics Concern   Not on file  Social History Narrative   Exercise --- bikes ,, walking    Social Determinants of Health   Financial Resource Strain: Not on file  Food Insecurity: Not on file  Transportation Needs: Not on file  Physical Activity: Not on file  Stress: Not on file  Social Connections: Not on file  Intimate Partner Violence: Not on file    Outpatient Medications Prior to Visit  Medication Sig Dispense Refill   buPROPion (WELLBUTRIN SR) 150 MG 12 hr tablet TAKE 1 TABLET BY MOUTH EVERY DAY 90 tablet 1   hydrochlorothiazide (HYDRODIURIL) 25 MG tablet Take 1 tablet (25 mg total) by mouth daily. Pt needs OV for further refills 90 tablet 3   loratadine (CLARITIN) 10 MG tablet Take 10 mg by mouth daily.     Magnesium 250 MG TABS Take 1 tablet by mouth daily.     meloxicam (MOBIC) 15 MG tablet Take 0.5-1 tablets (7.5-15 mg total) by mouth daily as needed for pain. 90 tablet 3    polyethylene glycol powder (GLYCOLAX/MIRALAX) powder Take 17 g by mouth daily. (Patient taking differently: Take 17 g by mouth daily as needed.) 3350 g 0   potassium chloride SA (KLOR-CON M20) 20 MEQ tablet TAKE 1 TABLET(20 MEQ) BY MOUTH DAILY. 90 tablet 1   Probiotic Product (ALIGN PO) Take 1 capsule by mouth daily.     Vitamin D, Ergocalciferol, (DRISDOL) 1.25 MG (50000 UNIT) CAPS capsule TAKE 1 CAPSULE (50,000 UNITS TOTAL) BY MOUTH EVERY 7 (SEVEN) DAYS 12 capsule 1   erythromycin ophthalmic ointment Place 1 application into the left eye 3 (three) times daily. 3.5 g 0   Facility-Administered Medications Prior to Visit  Medication Dose Route Frequency Provider Last Rate Last Admin   0.9 %  sodium chloride infusion  500 mL Intravenous Continuous Danis, Estill Cotta III, MD        No Known Allergies  Review of Systems  Constitutional:  Negative for fever and malaise/fatigue.  HENT:  Negative for congestion, sinus pain and sore throat.   Eyes:  Negative for blurred vision.  Respiratory:  Negative for cough, shortness of breath and wheezing.   Cardiovascular:  Negative for chest pain, palpitations and leg swelling.  Gastrointestinal:  Negative for blood in stool, constipation, diarrhea, nausea and vomiting.  Genitourinary:  Negative for dysuria, frequency and hematuria.  Musculoskeletal:  Negative for back pain, joint pain and myalgias.  Skin:  Negative for rash.       (-)New moles  Neurological:  Negative for loss of consciousness and headaches.      Objective:    Physical Exam Vitals and nursing note reviewed.  Constitutional:      General: She is not in acute distress.    Appearance: Normal appearance. She is not ill-appearing.  HENT:     Head: Normocephalic and atraumatic.     Right Ear: Tympanic membrane, ear canal and external ear normal.     Left Ear: Tympanic membrane, ear canal and external ear normal.  Eyes:     Extraocular Movements: Extraocular movements intact.      Pupils: Pupils are equal, round, and reactive to light.  Cardiovascular:     Rate and Rhythm: Normal rate and regular rhythm.     Heart sounds: Normal heart sounds. No murmur heard.  No gallop.  Pulmonary:     Effort: Pulmonary effort is normal. No respiratory distress.     Breath sounds: Normal breath sounds. No wheezing or rales.  Abdominal:     General: Bowel sounds are normal. There is no distension.     Palpations: Abdomen is soft.     Tenderness: There is no abdominal tenderness. There is no guarding.  Skin:    General: Skin is warm and dry.  Neurological:     Mental Status: She is alert and oriented to person, place, and time.  Psychiatric:        Judgment: Judgment normal.    BP 124/82 (BP Location: Left Arm, Patient Position: Sitting, Cuff Size: Large)   Pulse 79   Temp 98 F (36.7 C) (Oral)   Resp 18   Ht '5\' 4"'$  (1.626 m)   Wt 248 lb (112.5 kg)   SpO2 95%   BMI 42.57 kg/m  Wt Readings from Last 3 Encounters:  12/13/21 248 lb (112.5 kg)  05/23/21 244 lb 0.8 oz (110.7 kg)  11/29/20 244 lb (110.7 kg)    Diabetic Foot Exam - Simple   No data filed    Lab Results  Component Value Date   WBC 7.4 05/23/2021   HGB 14.2 05/23/2021   HCT 41.9 05/23/2021   PLT 303 05/23/2021   GLUCOSE 93 05/23/2021   CHOL 125 11/29/2020   TRIG 121.0 11/29/2020   HDL 37.70 (L) 11/29/2020   LDLCALC 63 11/29/2020   ALT 29 11/29/2020   AST 24 11/29/2020   NA 137 05/23/2021   K 3.6 05/23/2021   CL 103 05/23/2021   CREATININE 0.79 05/23/2021   BUN 18 05/23/2021   CO2 28 05/23/2021   TSH 1.44 11/29/2020   HGBA1C 5.3 09/11/2019    Lab Results  Component Value Date   TSH 1.44 11/29/2020   Lab Results  Component Value Date   WBC 7.4 05/23/2021   HGB 14.2 05/23/2021   HCT 41.9 05/23/2021   MCV 94.4 05/23/2021   PLT 303 05/23/2021   Lab Results  Component Value Date   NA 137 05/23/2021   K 3.6 05/23/2021   CO2 28 05/23/2021   GLUCOSE 93 05/23/2021   BUN 18  05/23/2021   CREATININE 0.79 05/23/2021   BILITOT 0.7 11/29/2020   ALKPHOS 89 11/29/2020   AST 24 11/29/2020   ALT 29 11/29/2020   PROT 6.8 11/29/2020   ALBUMIN 4.2 11/29/2020   CALCIUM 9.5 05/23/2021   ANIONGAP 6 05/23/2021   GFR 87.02 11/29/2020   Lab Results  Component Value Date   CHOL 125 11/29/2020   Lab Results  Component Value Date   HDL 37.70 (L) 11/29/2020   Lab Results  Component Value Date   LDLCALC 63 11/29/2020   Lab Results  Component Value Date   TRIG 121.0 11/29/2020   Lab Results  Component Value Date   CHOLHDL 3 11/29/2020   Lab Results  Component Value Date   HGBA1C 5.3 09/11/2019   Mammogram- Last completed 06/20/2021. Results are normal. Repeat in 1 year.  Colonoscopy- Last completed 04/21/2016. Results showed: - Diverticulosis in the left colon. - No specimens collected.     Assessment & Plan:   Problem List Items Addressed This Visit       Unprioritized   MORBID OBESITY   Relevant Medications   Semaglutide-Weight Management (WEGOVY) 0.25 MG/0.5ML SOAJ   Vitamin D deficiency   Relevant Orders   VITAMIN D 25 Hydroxy (Vit-D  Deficiency, Fractures)   Severe obesity (BMI >= 40) (HCC)    Pt is on optavia diet  Start wegovy F/u 3 months or sooner prn        Relevant Medications   Semaglutide-Weight Management (WEGOVY) 0.25 MG/0.5ML SOAJ   Preventative health care - Primary    ghm utd Check labs  See avs        Relevant Orders   CBC with Differential/Platelet   Comprehensive metabolic panel   Lipid panel   TSH   Essential hypertension    Well controlled, no changes to meds. Encouraged heart healthy diet such as the DASH diet and exercise as tolerated.          Meds ordered this encounter  Medications   Semaglutide-Weight Management (WEGOVY) 0.25 MG/0.5ML SOAJ    Sig: Inject 0.25 mg into the skin once a week.    Dispense:  2 mL    Refill:  0    I, [name], DO, personally preformed the services  described in this documentation.  All medical record entries made by the scribe were at my direction and in my presence.  I have reviewed the chart and discharge instructions (if applicable) and agree that the record reflects my personal performance and is accurate and complete. 12/13/2021   I,Shehryar Baig,acting as a scribe for [name], DO.,have documented all relevant documentation on the behalf of [name], DO,as directed by  [name], DO while in the presence of [name], DO.   [name], DO

## 2021-12-13 NOTE — Assessment & Plan Note (Signed)
Pt is on optavia diet  Start wegovy F/u 3 months or sooner prn

## 2021-12-13 NOTE — Assessment & Plan Note (Signed)
Well controlled, no changes to meds. Encouraged heart healthy diet such as the DASH diet and exercise as tolerated.  °

## 2021-12-13 NOTE — Assessment & Plan Note (Signed)
ghm utd Check labs  See avs  

## 2021-12-13 NOTE — Patient Instructions (Signed)
Preventive Care 40-64 Years Old, Female Preventive care refers to lifestyle choices and visits with your health care provider that can promote health and wellness. Preventive care visits are also called wellness exams. What can I expect for my preventive care visit? Counseling Your health care provider may ask you questions about your: Medical history, including: Past medical problems. Family medical history. Pregnancy history. Current health, including: Menstrual cycle. Method of birth control. Emotional well-being. Home life and relationship well-being. Sexual activity and sexual health. Lifestyle, including: Alcohol, nicotine or tobacco, and drug use. Access to firearms. Diet, exercise, and sleep habits. Work and work environment. Sunscreen use. Safety issues such as seatbelt and bike helmet use. Physical exam Your health care provider will check your: Height and weight. These may be used to calculate your BMI (body mass index). BMI is a measurement that tells if you are at a healthy weight. Waist circumference. This measures the distance around your waistline. This measurement also tells if you are at a healthy weight and may help predict your risk of certain diseases, such as type 2 diabetes and high blood pressure. Heart rate and blood pressure. Body temperature. Skin for abnormal spots. What immunizations do I need?  Vaccines are usually given at various ages, according to a schedule. Your health care provider will recommend vaccines for you based on your age, medical history, and lifestyle or other factors, such as travel or where you work. What tests do I need? Screening Your health care provider may recommend screening tests for certain conditions. This may include: Lipid and cholesterol levels. Diabetes screening. This is done by checking your blood sugar (glucose) after you have not eaten for a while (fasting). Pelvic exam and Pap test. Hepatitis B test. Hepatitis C  test. HIV (human immunodeficiency virus) test. STI (sexually transmitted infection) testing, if you are at risk. Lung cancer screening. Colorectal cancer screening. Mammogram. Talk with your health care provider about when you should start having regular mammograms. This may depend on whether you have a family history of breast cancer. BRCA-related cancer screening. This may be done if you have a family history of breast, ovarian, tubal, or peritoneal cancers. Bone density scan. This is done to screen for osteoporosis. Talk with your health care provider about your test results, treatment options, and if necessary, the need for more tests. Follow these instructions at home: Eating and drinking  Eat a diet that includes fresh fruits and vegetables, whole grains, lean protein, and low-fat dairy products. Take vitamin and mineral supplements as recommended by your health care provider. Do not drink alcohol if: Your health care provider tells you not to drink. You are pregnant, may be pregnant, or are planning to become pregnant. If you drink alcohol: Limit how much you have to 0-1 drink a day. Know how much alcohol is in your drink. In the [place] one drink equals one 12 oz bottle of beer (355 mL), one 5 oz glass of wine (148 mL), or one 1 oz glass of hard liquor (44 mL). Lifestyle Brush your teeth every morning and night with fluoride toothpaste. Floss one time each day. Exercise for at least 30 minutes 5 or more days each week. Do not use any products that contain nicotine or tobacco. These products include cigarettes, chewing tobacco, and vaping devices, such as e-cigarettes. If you need help quitting, ask your health care provider. Do not use drugs. If you are sexually active, practice safe sex. Use a condom or other form of protection to   prevent STIs. If you do not wish to become pregnant, use a form of birth control. If you plan to become pregnant, see your health care provider for a  prepregnancy visit. Take aspirin only as told by your health care provider. Make sure that you understand how much to take and what form to take. Work with your health care provider to find out whether it is safe and beneficial for you to take aspirin daily. Find healthy ways to manage stress, such as: Meditation, yoga, or listening to music. Journaling. Talking to a trusted person. Spending time with friends and family. Minimize exposure to UV radiation to reduce your risk of skin cancer. Safety Always wear your seat belt while driving or riding in a vehicle. Do not drive: If you have been drinking alcohol. Do not ride with someone who has been drinking. When you are tired or distracted. While texting. If you have been using any mind-altering substances or drugs. Wear a helmet and other protective equipment during sports activities. If you have firearms in your house, make sure you follow all gun safety procedures. Seek help if you have been physically or sexually abused. What's next? Visit your health care provider once a year for an annual wellness visit. Ask your health care provider how often you should have your eyes and teeth checked. Stay up to date on all vaccines. This information is not intended to replace advice given to you by your health care provider. Make sure you discuss any questions you have with your health care provider. Document Revised: 12/22/2020 Document Reviewed: 12/22/2020 Elsevier Patient Education  [place].

## 2021-12-13 NOTE — Telephone Encounter (Signed)
Prior auth started via cover my meds.  Awaiting determination.  Key: [id number]

## 2021-12-14 ENCOUNTER — Other Ambulatory Visit (HOSPITAL_BASED_OUTPATIENT_CLINIC_OR_DEPARTMENT_OTHER): Payer: Self-pay

## 2021-12-14 NOTE — Telephone Encounter (Signed)
This request was denied because you did not meet the following clinical requirements: The requested medication and/or diagnosis are not a covered benefit and excluded from coverage in accordance with the terms and conditions of your plan benefit. Therefore, the request has been administratively denied. The requested medication and/or diagnosis are not a covered benefit and are excluded from coverage in accordance with the terms and conditions of your plan benefit. Therefore, this request has been administratively denied. The reason(s) [facility] did not approve this medication can be found above. This denial is based on our [facility] drug coverage policy, in addition to any supplementary information you or your prescriber may have submitted.

## 2021-12-15 ENCOUNTER — Other Ambulatory Visit (HOSPITAL_BASED_OUTPATIENT_CLINIC_OR_DEPARTMENT_OTHER): Payer: Self-pay

## 2021-12-16 ENCOUNTER — Other Ambulatory Visit (HOSPITAL_BASED_OUTPATIENT_CLINIC_OR_DEPARTMENT_OTHER): Payer: Self-pay

## 2021-12-19 ENCOUNTER — Other Ambulatory Visit (HOSPITAL_BASED_OUTPATIENT_CLINIC_OR_DEPARTMENT_OTHER): Payer: Self-pay

## 2021-12-20 ENCOUNTER — Other Ambulatory Visit (HOSPITAL_BASED_OUTPATIENT_CLINIC_OR_DEPARTMENT_OTHER): Payer: Self-pay

## 2021-12-20 ENCOUNTER — Encounter: Payer: Self-pay | Admitting: Family Medicine

## 2021-12-21 ENCOUNTER — Other Ambulatory Visit (HOSPITAL_BASED_OUTPATIENT_CLINIC_OR_DEPARTMENT_OTHER): Payer: Self-pay

## 2021-12-22 ENCOUNTER — Other Ambulatory Visit (HOSPITAL_BASED_OUTPATIENT_CLINIC_OR_DEPARTMENT_OTHER): Payer: Self-pay

## 2021-12-23 ENCOUNTER — Other Ambulatory Visit (HOSPITAL_BASED_OUTPATIENT_CLINIC_OR_DEPARTMENT_OTHER): Payer: Self-pay

## 2021-12-26 ENCOUNTER — Other Ambulatory Visit (HOSPITAL_BASED_OUTPATIENT_CLINIC_OR_DEPARTMENT_OTHER): Payer: Self-pay

## 2021-12-26 ENCOUNTER — Encounter: Payer: Self-pay | Admitting: *Deleted

## 2021-12-27 ENCOUNTER — Other Ambulatory Visit (HOSPITAL_BASED_OUTPATIENT_CLINIC_OR_DEPARTMENT_OTHER): Payer: Self-pay

## 2021-12-28 ENCOUNTER — Other Ambulatory Visit (HOSPITAL_BASED_OUTPATIENT_CLINIC_OR_DEPARTMENT_OTHER): Payer: Self-pay

## 2021-12-29 ENCOUNTER — Other Ambulatory Visit (HOSPITAL_BASED_OUTPATIENT_CLINIC_OR_DEPARTMENT_OTHER): Payer: Self-pay

## 2021-12-30 ENCOUNTER — Other Ambulatory Visit (HOSPITAL_BASED_OUTPATIENT_CLINIC_OR_DEPARTMENT_OTHER): Payer: Self-pay

## 2022-01-02 ENCOUNTER — Other Ambulatory Visit (HOSPITAL_BASED_OUTPATIENT_CLINIC_OR_DEPARTMENT_OTHER): Payer: Self-pay

## 2022-01-03 ENCOUNTER — Other Ambulatory Visit (HOSPITAL_BASED_OUTPATIENT_CLINIC_OR_DEPARTMENT_OTHER): Payer: Self-pay

## 2022-01-04 ENCOUNTER — Other Ambulatory Visit (HOSPITAL_BASED_OUTPATIENT_CLINIC_OR_DEPARTMENT_OTHER): Payer: Self-pay

## 2022-01-06 ENCOUNTER — Other Ambulatory Visit (HOSPITAL_BASED_OUTPATIENT_CLINIC_OR_DEPARTMENT_OTHER): Payer: Self-pay

## 2022-01-12 ENCOUNTER — Other Ambulatory Visit (HOSPITAL_BASED_OUTPATIENT_CLINIC_OR_DEPARTMENT_OTHER): Payer: Self-pay

## 2022-01-13 ENCOUNTER — Other Ambulatory Visit (HOSPITAL_BASED_OUTPATIENT_CLINIC_OR_DEPARTMENT_OTHER): Payer: Self-pay

## 2022-01-16 ENCOUNTER — Other Ambulatory Visit (HOSPITAL_BASED_OUTPATIENT_CLINIC_OR_DEPARTMENT_OTHER): Payer: Self-pay

## 2022-01-16 NOTE — Telephone Encounter (Signed)
Called for update on appeal and it is still in review.

## 2022-01-17 ENCOUNTER — Other Ambulatory Visit (HOSPITAL_BASED_OUTPATIENT_CLINIC_OR_DEPARTMENT_OTHER): Payer: Self-pay

## 2022-01-18 ENCOUNTER — Other Ambulatory Visit (HOSPITAL_BASED_OUTPATIENT_CLINIC_OR_DEPARTMENT_OTHER): Payer: Self-pay

## 2022-01-19 ENCOUNTER — Other Ambulatory Visit (HOSPITAL_BASED_OUTPATIENT_CLINIC_OR_DEPARTMENT_OTHER): Payer: Self-pay

## 2022-01-24 ENCOUNTER — Other Ambulatory Visit (HOSPITAL_BASED_OUTPATIENT_CLINIC_OR_DEPARTMENT_OTHER): Payer: Self-pay

## 2022-02-01 ENCOUNTER — Other Ambulatory Visit (HOSPITAL_BASED_OUTPATIENT_CLINIC_OR_DEPARTMENT_OTHER): Payer: Self-pay

## 2022-02-01 ENCOUNTER — Encounter: Payer: Self-pay | Admitting: Family Medicine

## 2022-02-02 ENCOUNTER — Other Ambulatory Visit (HOSPITAL_BASED_OUTPATIENT_CLINIC_OR_DEPARTMENT_OTHER): Payer: Self-pay

## 2022-02-03 ENCOUNTER — Other Ambulatory Visit (HOSPITAL_BASED_OUTPATIENT_CLINIC_OR_DEPARTMENT_OTHER): Payer: Self-pay

## 2022-02-04 ENCOUNTER — Encounter: Payer: Self-pay | Admitting: Family Medicine

## 2022-02-06 ENCOUNTER — Other Ambulatory Visit (HOSPITAL_BASED_OUTPATIENT_CLINIC_OR_DEPARTMENT_OTHER): Payer: Self-pay

## 2022-02-06 NOTE — Telephone Encounter (Signed)
Spoke with insurance and they stated that appeal was denied.  Patient notified.

## 2022-02-07 ENCOUNTER — Other Ambulatory Visit (HOSPITAL_BASED_OUTPATIENT_CLINIC_OR_DEPARTMENT_OTHER): Payer: Self-pay

## 2022-02-08 ENCOUNTER — Other Ambulatory Visit: Payer: Self-pay | Admitting: Family Medicine

## 2022-02-08 ENCOUNTER — Other Ambulatory Visit (HOSPITAL_BASED_OUTPATIENT_CLINIC_OR_DEPARTMENT_OTHER): Payer: Self-pay

## 2022-02-08 DIAGNOSIS — I10 Essential (primary) hypertension: Secondary | ICD-10-CM

## 2022-02-15 ENCOUNTER — Encounter (INDEPENDENT_AMBULATORY_CARE_PROVIDER_SITE_OTHER): Payer: Self-pay

## 2022-02-16 ENCOUNTER — Other Ambulatory Visit (HOSPITAL_BASED_OUTPATIENT_CLINIC_OR_DEPARTMENT_OTHER): Payer: Self-pay

## 2022-03-17 ENCOUNTER — Ambulatory Visit: Payer: 59 | Admitting: Family Medicine

## 2022-04-11 ENCOUNTER — Other Ambulatory Visit: Payer: Self-pay | Admitting: Family Medicine

## 2022-04-11 DIAGNOSIS — E559 Vitamin D deficiency, unspecified: Secondary | ICD-10-CM

## 2022-06-04 ENCOUNTER — Other Ambulatory Visit: Payer: Self-pay | Admitting: Family Medicine

## 2022-06-04 DIAGNOSIS — F3289 Other specified depressive episodes: Secondary | ICD-10-CM

## 2022-06-06 ENCOUNTER — Other Ambulatory Visit: Payer: Self-pay | Admitting: Family Medicine

## 2022-06-06 DIAGNOSIS — Z1231 Encounter for screening mammogram for malignant neoplasm of breast: Secondary | ICD-10-CM

## 2022-07-07 ENCOUNTER — Telehealth: Payer: Self-pay | Admitting: Family Medicine

## 2022-07-07 NOTE — Telephone Encounter (Signed)
Pt tested + for covid today, sxs started yesterday. She has been taking zinc, vitamin c and mucinex and would like to know if she should take anything else. Please advise.

## 2022-07-07 NOTE — Telephone Encounter (Signed)
Spoke with patient. Pt states will continue OTC medication. Pt advised to stay well hydrated and if sxs gets worse over the weekend to go to a UC

## 2022-08-01 ENCOUNTER — Ambulatory Visit
Admission: RE | Admit: 2022-08-01 | Discharge: 2022-08-01 | Disposition: A | Discharge status: Home / Self Care | Payer: Self-pay | Source: Ambulatory Visit | Attending: Family Medicine | Admitting: Family Medicine

## 2022-08-01 DIAGNOSIS — Z1231 Encounter for screening mammogram for malignant neoplasm of breast: Secondary | ICD-10-CM

## 2022-08-07 ENCOUNTER — Other Ambulatory Visit: Payer: Self-pay | Admitting: Family Medicine

## 2022-08-07 DIAGNOSIS — I10 Essential (primary) hypertension: Secondary | ICD-10-CM

## 2022-08-08 ENCOUNTER — Other Ambulatory Visit: Payer: Self-pay | Admitting: Family Medicine

## 2022-08-08 DIAGNOSIS — I10 Essential (primary) hypertension: Secondary | ICD-10-CM

## 2022-09-05 ENCOUNTER — Encounter: Payer: Self-pay | Admitting: Family Medicine

## 2022-09-06 NOTE — Telephone Encounter (Signed)
This medication has phentermine in it. Would you prescribe this?

## 2022-09-18 ENCOUNTER — Ambulatory Visit: Payer: 59 | Admitting: Family Medicine

## 2022-09-18 ENCOUNTER — Encounter: Payer: Self-pay | Admitting: Family Medicine

## 2022-09-18 VITALS — BP 128/70 | HR 85 | Temp 97.8°F | Resp 18 | Ht 64.0 in | Wt 263.6 lb

## 2022-09-18 DIAGNOSIS — F321 Major depressive disorder, single episode, moderate: Secondary | ICD-10-CM

## 2022-09-18 MED ORDER — BUPROPION HCL ER (XL) 300 MG PO TB24: 300.0000 mg | ORAL_TABLET | Freq: Every day | ORAL | 3 refills | Status: DC

## 2022-09-18 NOTE — Assessment & Plan Note (Addendum)
Ins will not pay for wegovy / or any of the weight loss meds  We will increase wellbutrin to 300 mg daily  F/u 1 month

## 2022-09-18 NOTE — Patient Instructions (Signed)
Obesity, Adult ?Obesity is the condition of having too much total body fat. Being overweight or obese means that your weight is greater than what is considered healthy for your body size. Obesity is determined by a measurement called BMI (body mass index). BMI is an estimate of body fat and is calculated from height and weight. For adults, a BMI of 30 or higher is considered obese. ?Obesity can lead to other health concerns and major illnesses, including: ?Stroke. ?Coronary artery disease (CAD). ?Type 2 diabetes. ?Some types of cancer, including cancers of the colon, breast, uterus, and gallbladder. ?High blood pressure (hypertension). ?High cholesterol. ?Gallbladder stones. ?Obesity can also contribute to: ?Osteoarthritis. ?Sleep apnea. ?Infertility problems. ?What are the causes? ?Common causes of this condition include: ?Eating daily meals that are high in calories, sugar, and fat. ?Drinking high amounts of sugar-sweetened beverages, such as soft drinks. ?Being born with genes that may make you more likely to become obese. ?Having a medical condition that causes obesity, including: ?Hypothyroidism. ?Polycystic ovarian syndrome (PCOS). ?Binge-eating disorder. ?Cushing syndrome. ?Taking certain medicines, such as steroids, antidepressants, and seizure medicines. ?Not being physically active (sedentary lifestyle). ?Not getting enough sleep. ?What increases the risk? ?The following factors may make you more likely to develop this condition: ?Having a family history of obesity. ?Living in an area with limited access to: ?Parks, recreation centers, or sidewalks. ?Healthy food choices, such as grocery stores and farmers' markets. ?What are the signs or symptoms? ?The main sign of this condition is having too much body fat. ?How is this diagnosed? ?This condition is diagnosed based on: ?Your BMI. If you are an adult with a BMI of 30 or higher, you are considered obese. ?Your waist circumference. This measures the  distance around your waistline. ?Your skinfold thickness. Your health care provider may gently pinch a fold of your skin and measure it. ?You may have other tests to check for underlying conditions. ?How is this treated? ?Treatment for this condition often includes changing your lifestyle. Treatment may include some or all of the following: ?Dietary changes. This may include developing a healthy meal plan. ?Regular physical activity. This may include activity that causes your heart to beat faster (aerobic exercise) and strength training. Work with your health care provider to design an exercise program that works for you. ?Medicine to help you lose weight if you are unable to lose one pound a week after six weeks of healthy eating and more physical activity. ?Treating conditions that cause the obesity (underlying conditions). ?Surgery. Surgical options may include gastric banding and gastric bypass. Surgery may be done if: ?Other treatments have not helped to improve your condition. ?You have a BMI of 40 or higher. ?You have life-threatening health problems related to obesity. ?Follow these instructions at home: ?Eating and drinking ? ?Follow recommendations from your health care provider about what you eat and drink. Your health care provider may advise you to: ?Limit fast food, sweets, and processed snack foods. ?Choose low-fat options, such as low-fat milk instead of whole milk. ?Eat five or more servings of fruits or vegetables every day. ?Choose healthy foods when you eat out. ?Keep low-fat snacks available. ?Limit sugary drinks, such as soda, fruit juice, sweetened iced tea, and flavored milk. ?Drink enough water to keep your urine pale yellow. ?Do not follow a fad diet. Fad diets can be unhealthy and even dangerous. ?Other healthful choices include: ?Eat at home more often. This gives you more control over what you eat. ?Learn to read food labels.   This will help you understand how much food is considered one  serving. ?Learn what a healthy serving size is. ?Physical activity ?Exercise regularly, as told by your health care provider. ?Most adults should get up to 150 minutes of moderate-intensity exercise every week. ?Ask your health care provider what types of exercise are safe for you and how often you should exercise. ?Warm up and stretch before being active. ?Cool down and stretch after being active. ?Rest between periods of activity. ?Lifestyle ?Work with your health care provider and a dietitian to set a weight-loss goal that is healthy and reasonable for you. ?Limit your screen time. ?Find ways to reward yourself that do not involve food. ?Do not drink alcohol if: ?Your health care provider tells you not to drink. ?You are pregnant, may be pregnant, or are planning to become pregnant. ?If you drink alcohol: ?Limit how much you have to: ?0-1 drink a day for women. ?0-2 drinks a day for men. ?Know how much alcohol is in your drink. In the [place] one drink equals one 12 oz bottle of beer (355 mL), one 5 oz glass of wine (148 mL), or one 1? oz glass of hard liquor (44 mL). ?General instructions ?Keep a weight-loss journal to keep track of the food you eat and how much exercise you get. ?Take over-the-counter and prescription medicines only as told by your health care provider. ?Take vitamins and supplements only as told by your health care provider. ?Consider joining a support group. Your health care provider may be able to recommend a support group. ?Pay attention to your mental health as obesity can lead to depression or self esteem issues. ?Keep all follow-up visits. This is important. ?Contact a health care provider if: ?You are unable to meet your weight-loss goal after six weeks of dietary and lifestyle changes. ?You have trouble breathing. ?Summary ?Obesity is the condition of having too much total body fat. ?Being overweight or obese means that your weight is greater than what is considered healthy for your body  size. ?Work with your health care provider and a dietitian to set a weight-loss goal that is healthy and reasonable for you. ?Exercise regularly, as told by your health care provider. Ask your health care provider what types of exercise are safe for you and how often you should exercise. ?This information is not intended to replace advice given to you by your health care provider. Make sure you discuss any questions you have with your health care provider. ?Document Revised: 02/01/2021 Document Reviewed: 02/01/2021 ?Elsevier Patient Education ? 2023 Elsevier [employer] ? ?

## 2022-09-18 NOTE — Progress Notes (Signed)
Subjective:   By signing my name below, I, Audrey Duncan, attest that this documentation has been prepared under the direction and in the presence of Audrey Held, DO. 09/18/2022   Patient ID: Audrey Duncan, female    DOB: 12-19-1960, 62 y.o.   MRN: [id number]  Chief Complaint  Patient presents with   Medication Management    HPI Patient is in today for an office visit.  Left Hand Pain She complains of pain and itching in her left hands accompanied with swelling and blisters.   Weight  She is interested in losing weight. She is taking 150 mg Wellbutrin daily PO to manage it. She has not been able to receive the 0.25 mg Wegovy injections. She reports that she walks and bikes regularly. She has been cutting sugar out of her diet recently as well.  Wt Readings from Last 3 Encounters:  09/18/22 263 lb 9.6 oz (119.6 kg)  12/13/21 248 lb (112.5 kg)  05/23/21 244 lb 0.8 oz (110.7 kg)      Past Medical History:  Diagnosis Date   Asthma    Chiari malformation type II ([place])    diagnosed when in high school   Chiari malformation type II ([place])    Colon polyp 2006   (TUBULAR ADENOMA)--COLONOSCOPY-DR. HAYES   Constipation    Fluid retention    H/O seasonal allergies    Medial meniscus tear    left   Over weight    Ulcerative colitis ([place])     Past Surgical History:  Procedure Laterality Date   ABDOMINAL HYSTERECTOMY  1999   complete   CESAREAN SECTION  1995 and [place]  [id number]   umbilical    Family History  Problem Relation Age of Onset   Heart disease Mother 69       2 MIs    Stroke Mother 73   Hypertension Mother    Cancer Mother        lung, lymphoma   Lung cancer Mother        was a smoker   Kidney disease Mother    Obesity Mother    COPD Father        smoker   Cancer Father        colon, lung prostate   Alcohol abuse Father    Hypertension Father    Obesity Father    Asthma Sister    Alcohol abuse Brother    Diabetes  Paternal Uncle    Breast cancer Neg Hx     Social History   Socioeconomic History   Marital status: Married    Spouse name: Audrey Duncan   Number of children: 2   Years of education: Not on file   Highest education level: Not on file  Occupational History    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  Tobacco Use   Smoking status: Never   Smokeless tobacco: Never  Vaping Use   Vaping Use: Never used  Substance and Sexual Activity   Alcohol use: Yes    Comment: 1 glass of wine 1-2 x a month   Drug use: No   Sexual activity: Yes    Partners: Male    Birth control/protection: Surgical  Other Topics Concern   Not on file  Social History Narrative   Exercise --- bikes ,, walking    Social Determinants of Health   Financial Resource Strain: Not on file  Food Insecurity: Not on file  Transportation Needs: Not on file  Physical Activity: Not on file  Stress: Not on file  Social Connections: Not on file  Intimate Partner Violence: Not on file    Outpatient Medications Prior to Visit  Medication Sig Dispense Refill   hydrochlorothiazide (HYDRODIURIL) 25 MG tablet TAKE 1 TABLET (25 MG TOTAL) BY MOUTH DAILY. 90 tablet 0   loratadine (CLARITIN) 10 MG tablet Take 10 mg by mouth daily.     Magnesium 250 MG TABS Take 1 tablet by mouth daily.     meloxicam (MOBIC) 15 MG tablet Take 0.5-1 tablets (7.5-15 mg total) by mouth daily as needed for pain. 90 tablet 3   polyethylene glycol powder (GLYCOLAX/MIRALAX) powder Take 17 g by mouth daily. (Patient taking differently: Take 17 g by mouth daily as needed.) 3350 g 0   potassium chloride SA (KLOR-CON M20) 20 MEQ tablet TAKE 1 TABLET BY MOUTH DAILY. 90 tablet 1   Probiotic Product (ALIGN PO) Take 1 capsule by mouth daily.     Vitamin D, Ergocalciferol, (DRISDOL) 1.25 MG (50000 UNIT) CAPS capsule TAKE 1 CAPSULE (50,000 UNITS TOTAL) BY MOUTH EVERY 7 (SEVEN) DAYS 12 capsule 1   buPROPion (WELLBUTRIN SR) 150 MG 12 hr tablet TAKE 1 TABLET BY MOUTH EVERY DAY 90 tablet 1    Semaglutide-Weight Management (WEGOVY) 0.25 MG/0.5ML SOAJ Inject 0.25 mg into the skin once a week. (Patient not taking: Reported on 09/18/2022) 2 mL 0   Facility-Administered Medications Prior to Visit  Medication Dose Route Frequency Provider Last Rate Last Admin   0.9 %  sodium chloride infusion  500 mL Intravenous Continuous Danis, Estill Cotta III, MD        No Known Allergies  Review of Systems  Constitutional:  Negative for fever and malaise/fatigue.  HENT:  Negative for congestion.   Eyes:  Negative for blurred vision.  Respiratory:  Negative for cough and shortness of breath.   Cardiovascular:  Negative for chest pain, palpitations and leg swelling.  Gastrointestinal:  Negative for vomiting.  Musculoskeletal:  Positive for joint pain (in left hand). Negative for back pain.       (+) swelling in left hand  Skin:  Positive for itching (in left hand). Negative for rash.       (+) blistering in left hand  Neurological:  Negative for loss of consciousness and headaches.       Objective:    Physical Exam Constitutional:      General: She is not in acute distress.    Appearance: Normal appearance. She is not toxic-appearing.  HENT:     Right Ear: External ear normal.     Left Ear: External ear normal.  Eyes:     Extraocular Movements: Extraocular movements intact.     Pupils: Pupils are equal, round, and reactive to light.  Cardiovascular:     Rate and Rhythm: Normal rate and regular rhythm.     Heart sounds: Normal heart sounds. No murmur heard.    No gallop.  Pulmonary:     Effort: Pulmonary effort is normal. No respiratory distress.     Breath sounds: Normal breath sounds. No wheezing or rales.  Skin:    General: Skin is warm.  Neurological:     Mental Status: She is alert and oriented to person, place, and time.  Psychiatric:        Mood and Affect: Mood normal.        Judgment: Judgment normal.     BP 128/70 (BP Location: Left Arm, Patient Position: Sitting, Cuff  Size: Large)   Pulse 85   Temp 97.8 F (36.6 C) (Oral)   Resp 18   Ht '5\' 4"'$  (1.626 m)   Wt 263 lb 9.6 oz (119.6 kg)   SpO2 95%   BMI 45.25 kg/m  Wt Readings from Last 3 Encounters:  09/18/22 263 lb 9.6 oz (119.6 kg)  12/13/21 248 lb (112.5 kg)  05/23/21 244 lb 0.8 oz (110.7 kg)       Assessment & Plan:  Depression, major, single episode, moderate (HCC) -     buPROPion HCl ER (XL); Take 1 tablet (300 mg total) by mouth daily.  Dispense: 90 tablet; Refill: 3  MORBID OBESITY Assessment & Plan: Ins will not pay for wegovy / or any of the weight loss meds  We will increase wellbutrin to 300 mg daily  F/u 1 month     I, Audrey Held, DO, personally preformed the services described in this documentation.  All medical record entries made by the scribe were at my direction and in my presence.  I have reviewed the chart and discharge instructions (if applicable) and agree that the record reflects my personal performance and is accurate and complete. 09/18/2022  [place] as a scribe for Audrey Held, DO.,have documented all relevant documentation on the behalf of Audrey Held, DO,as directed by  Audrey Held, DO while in the presence of Audrey Held, DO.

## 2022-10-08 ENCOUNTER — Other Ambulatory Visit: Payer: Self-pay | Admitting: Family Medicine

## 2022-10-08 DIAGNOSIS — F3289 Other specified depressive episodes: Secondary | ICD-10-CM

## 2022-10-08 DIAGNOSIS — E559 Vitamin D deficiency, unspecified: Secondary | ICD-10-CM

## 2022-10-10 ENCOUNTER — Other Ambulatory Visit: Payer: Self-pay | Admitting: Family Medicine

## 2022-10-10 DIAGNOSIS — I10 Essential (primary) hypertension: Secondary | ICD-10-CM

## 2022-10-11 MED ORDER — HYDROCHLOROTHIAZIDE 25 MG PO TABS
25.0000 mg | ORAL_TABLET | Freq: Every day | ORAL | 1 refills | Status: DC
Start: 2022-10-11 — End: 2023-03-23

## 2023-02-06 ENCOUNTER — Other Ambulatory Visit: Payer: Self-pay | Admitting: Family Medicine

## 2023-02-06 DIAGNOSIS — I10 Essential (primary) hypertension: Secondary | ICD-10-CM

## 2023-03-08 ENCOUNTER — Other Ambulatory Visit: Payer: Self-pay | Admitting: Family Medicine

## 2023-03-08 DIAGNOSIS — I10 Essential (primary) hypertension: Secondary | ICD-10-CM

## 2023-03-08 NOTE — Telephone Encounter (Signed)
Pt called to make CPE appt. Pt is scheduled on 11.5.24. Ok to refill to that point or does pt need sooner appt to continue refills? Please Advise

## 2023-03-22 ENCOUNTER — Other Ambulatory Visit: Payer: Self-pay | Admitting: Family Medicine

## 2023-03-22 DIAGNOSIS — I10 Essential (primary) hypertension: Secondary | ICD-10-CM

## 2023-03-24 ENCOUNTER — Other Ambulatory Visit: Payer: Self-pay | Admitting: Family Medicine

## 2023-03-24 DIAGNOSIS — I10 Essential (primary) hypertension: Secondary | ICD-10-CM

## 2023-03-30 ENCOUNTER — Other Ambulatory Visit: Payer: Self-pay | Admitting: Family Medicine

## 2023-03-30 DIAGNOSIS — E559 Vitamin D deficiency, unspecified: Secondary | ICD-10-CM

## 2023-04-17 ENCOUNTER — Other Ambulatory Visit: Payer: Self-pay | Admitting: Family Medicine

## 2023-04-17 DIAGNOSIS — I10 Essential (primary) hypertension: Secondary | ICD-10-CM

## 2023-04-22 ENCOUNTER — Ambulatory Visit
Admission: EM | Admit: 2023-04-22 | Discharge: 2023-04-22 | Disposition: A | Discharge status: Home / Self Care | Payer: 59 | Attending: Internal Medicine | Admitting: Internal Medicine

## 2023-04-22 DIAGNOSIS — M5441 Lumbago with sciatica, right side: Secondary | ICD-10-CM | POA: Diagnosis not present

## 2023-04-22 DIAGNOSIS — T2124XA Burn of second degree of lower back, initial encounter: Secondary | ICD-10-CM

## 2023-04-22 MED ORDER — CYCLOBENZAPRINE HCL 10 MG PO TABS
10.0000 mg | ORAL_TABLET | Freq: Every evening | ORAL | 0 refills | Status: AC | PRN
Start: 2023-04-22 — End: ?

## 2023-04-22 MED ORDER — PREDNISONE 20 MG PO TABS
ORAL_TABLET | ORAL | 0 refills | Status: DC
Start: 2023-04-22 — End: 2023-08-30

## 2023-04-22 MED ORDER — SILVER SULFADIAZINE 1 % EX CREA
1.0000 | TOPICAL_CREAM | Freq: Every day | CUTANEOUS | 0 refills | Status: AC
Start: 2023-04-22 — End: ?

## 2023-04-22 NOTE — Discharge Instructions (Signed)
Please change your dressing 2-3 times daily. Apply silvadene cream generously and secure with a non-stick dressing. Each time you change your dressing, make sure you clean gently around the perimeter of the wound with gentle soap and warm water. Do not peel off any dead skin. If it comes off in the process of washing your wound or removing the dressing, that's okay. Pat your wound dry and let it air out if possible for an hour before reapplying another dressing.

## 2023-04-22 NOTE — ED Triage Notes (Signed)
Pt reports right sided low back pain and sometimes shoot to the right leg x 3 weeks. Started when she bend over and was not abe to stand up, got better and then she picked up her 64 lbs dog and pain started again. Pt states she has a blister since yesterday. Ibuprofen and hot compress gives some relief.
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
## 2023-05-15 ENCOUNTER — Encounter: Payer: Self-pay | Admitting: Family Medicine

## 2023-05-15 ENCOUNTER — Ambulatory Visit (INDEPENDENT_AMBULATORY_CARE_PROVIDER_SITE_OTHER): Payer: 59 | Admitting: Family Medicine

## 2023-05-15 VITALS — BP 124/90 | HR 84 | Temp 98.5°F | Resp 18 | Ht 64.0 in | Wt 267.2 lb

## 2023-05-15 DIAGNOSIS — Z0000 Encounter for general adult medical examination without abnormal findings: Secondary | ICD-10-CM | POA: Diagnosis not present

## 2023-05-15 DIAGNOSIS — Z6841 Body Mass Index (BMI) 40.0 and over, adult: Secondary | ICD-10-CM | POA: Diagnosis not present

## 2023-05-15 LAB — CBC WITH DIFFERENTIAL/PLATELET
Basophils Absolute: 0.1 10*3/uL (ref 0.0–0.1)
Basophils Relative: 1.2 % (ref 0.0–3.0)
Eosinophils Absolute: 0.2 10*3/uL (ref 0.0–0.7)
Eosinophils Relative: 3 % (ref 0.0–5.0)
HCT: 43.2 % (ref 36.0–46.0)
Hemoglobin: 13.9 g/dL (ref 12.0–15.0)
Lymphocytes Relative: 25.7 % (ref 12.0–46.0)
Lymphs Abs: 1.4 10*3/uL (ref 0.7–4.0)
MCHC: 32.1 g/dL (ref 30.0–36.0)
MCV: 95.8 fL (ref 78.0–100.0)
Monocytes Absolute: 0.5 10*3/uL (ref 0.1–1.0)
Monocytes Relative: 8.6 % (ref 3.0–12.0)
Neutro Abs: 3.4 10*3/uL (ref 1.4–7.7)
Neutrophils Relative %: 61.5 % (ref 43.0–77.0)
Platelets: 291 10*3/uL (ref 150.0–400.0)
RBC: 4.51 Mil/uL (ref 3.87–5.11)
RDW: 12.7 % (ref 11.5–15.5)
WBC: 5.5 10*3/uL (ref 4.0–10.5)

## 2023-05-15 LAB — LIPID PANEL
Cholesterol: 162 mg/dL (ref 0–200)
HDL: 56.4 mg/dL (ref >39.00)
LDL Cholesterol: 91 mg/dL (ref 0–99)
NonHDL: 105.36
Total CHOL/HDL Ratio: 3
Triglycerides: 72 mg/dL (ref 0.0–149.0)
VLDL: 14.4 mg/dL (ref 0.0–40.0)

## 2023-05-15 LAB — COMPREHENSIVE METABOLIC PANEL
ALT: 26 U/L (ref 0–35)
AST: 23 U/L (ref 0–37)
Albumin: 4.1 g/dL (ref 3.5–5.2)
Alkaline Phosphatase: 92 U/L (ref 39–117)
BUN: 17 mg/dL (ref 6–23)
CO2: 34 meq/L — ABNORMAL HIGH (ref 19–32)
Calcium: 9.7 mg/dL (ref 8.4–10.5)
Chloride: 102 meq/L (ref 96–112)
Creatinine, Ser: 0.9 mg/dL (ref 0.40–1.20)
GFR: 67.63 mL/min (ref >60.00)
Glucose, Bld: 96 mg/dL (ref 70–99)
Potassium: 4.6 meq/L (ref 3.5–5.1)
Sodium: 143 meq/L (ref 135–145)
Total Bilirubin: 0.8 mg/dL (ref 0.2–1.2)
Total Protein: 6.6 g/dL (ref 6.0–8.3)

## 2023-05-15 LAB — VITAMIN B12: Vitamin B-12: 870 pg/mL (ref 211–911)

## 2023-05-15 LAB — TSH: TSH: 1.37 u[IU]/mL (ref 0.35–5.50)

## 2023-05-15 LAB — VITAMIN D 25 HYDROXY (VIT D DEFICIENCY, FRACTURES): VITD: 43.08 ng/mL (ref 30.00–100.00)

## 2023-05-15 MED ORDER — TIRZEPATIDE-WEIGHT MANAGEMENT 2.5 MG/0.5ML ~~LOC~~ SOLN
2.5000 mg | SUBCUTANEOUS | 2 refills | Status: DC
Start: 2023-05-15 — End: 2023-05-21

## 2023-05-15 NOTE — Progress Notes (Signed)
Established Patient Office Visit  Subjective   Patient ID: Audrey Duncan, female    DOB: April 14, 1961  Age: 63 y.o. MRN: [id number]  Chief Complaint  Patient presents with   Annual Exam    Pt states fasting     HPI Discussed the use of AI scribe software for clinical note transcription with the patient, who gave verbal consent to proceed.  History of Present Illness   The patient, with a history of obesity, presents for a routine follow-up with a primary concern of weight management. She reports previous attempts at weight loss through various programs, but has struggled with regaining weight after discontinuing these programs. The patient expresses interest in a new weight loss medication, but is concerned about the cost as her insurance does not cover it. She also mentions a minor skin issue on her hand, characterized by itching and small blisters that eventually scab over. The patient suspects this may be due to a new lotion at her workplace.      Patient Active Problem List   Diagnosis Date Noted   Preventative health care 11/29/2020   Depression 08/12/2018   Insulin resistance 07/25/2018   Vitamin D deficiency 09/25/2017   Prediabetes 09/25/2017   Shortness of breath on exertion 09/10/2017   Other fatigue 09/10/2017   Hyperglycemia 09/10/2017   Chronic constipation 09/10/2017   Class 3 severe obesity with serious comorbidity and body mass index (BMI) of 45.0 to 49.9 in adult (HCC) 09/10/2017   Severe obesity (BMI >= 40) (HCC) 06/12/2014   Lung nodule 11/19/2012   MORBID OBESITY 03/28/2010   POSTMENOPAUSAL STATUS 03/28/2010   Asthma, episodic with mucus plugs 06/14/2009   HYPOKALEMIA 02/21/2008   Essential hypertension 09/06/2007   Past Medical History:  Diagnosis Date   Asthma    Chiari malformation type II (HCC)    diagnosed when in high school   Chiari malformation type II (HCC)    Colon polyp 2006   (TUBULAR ADENOMA)--COLONOSCOPY-DR. HAYES    Constipation    Fluid retention    H/O seasonal allergies    Medial meniscus tear    left   Over weight    Ulcerative colitis (HCC)    Past Surgical History:  Procedure Laterality Date   ABDOMINAL HYSTERECTOMY  1999   complete   CESAREAN SECTION  1995 and 1998   HERNIA REPAIR  2004   umbilical   Social History   Tobacco Use   Smoking status: Never   Smokeless tobacco: Never  Vaping Use   Vaping status: Never Used  Substance Use Topics   Alcohol use: Yes    Comment: 1 glass of wine 1-2 x a month   Drug use: No   Social History   Socioeconomic History   Marital status: Married    Spouse name: Mat Carne   Number of children: 2   Years of education: Not on file   Highest education level: Not on file  Occupational History    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  Tobacco Use   Smoking status: Never   Smokeless tobacco: Never  Vaping Use   Vaping status: Never Used  Substance and Sexual Activity   Alcohol use: Yes    Comment: 1 glass of wine 1-2 x a month   Drug use: No   Sexual activity: Yes    Partners: Male    Birth control/protection: Surgical  Other Topics Concern   Not on file  Social History Narrative   Exercise --- bikes ,, walking  Social Determinants of Health   Financial Resource Strain: Not on file  Food Insecurity: Not on file  Transportation Needs: Not on file  Physical Activity: Not on file  Stress: Not on file  Social Connections: Not on file  Intimate Partner Violence: Not on file   Family Status  Relation Name Status   Mother  Deceased   Father  Alive   Sister  Alive   Brother  Alive   Pat Uncle  Alive   Neg Hx  (Not Specified)  No partnership data on file   Family History  Problem Relation Age of Onset   Heart disease Mother 31       2 MIs    Stroke Mother 21   Hypertension Mother    Cancer Mother        lung, lymphoma   Lung cancer Mother        was a smoker   Kidney disease Mother    Obesity Mother    COPD Father        smoker   Cancer  Father        colon, lung prostate   Alcohol abuse Father    Hypertension Father    Obesity Father    Alzheimer's disease Father    Asthma Sister    Alcohol abuse Brother    Diabetes Paternal Uncle    Breast cancer Neg Hx    No Known Allergies    Review of Systems  Constitutional:  Negative for chills, fever and malaise/fatigue.  HENT:  Negative for congestion and hearing loss.   Eyes:  Negative for blurred vision and discharge.  Respiratory:  Negative for cough, sputum production and shortness of breath.   Cardiovascular:  Negative for chest pain, palpitations and leg swelling.  Gastrointestinal:  Negative for abdominal pain, blood in stool, constipation, diarrhea, heartburn, nausea and vomiting.  Genitourinary:  Negative for dysuria, frequency, hematuria and urgency.  Musculoskeletal:  Negative for back pain, falls and myalgias.  Skin:  Negative for rash.  Neurological:  Negative for dizziness, sensory change, loss of consciousness, weakness and headaches.  Endo/Heme/Allergies:  Negative for environmental allergies. Does not bruise/bleed easily.  Psychiatric/Behavioral:  Negative for depression and suicidal ideas. The patient is not nervous/anxious and does not have insomnia.       Objective:     BP (!) 124/90 (BP Location: Left Arm, Patient Position: Sitting, Cuff Size: Large)   Pulse 84   Temp 98.5 F (36.9 C) (Oral)   Resp 18   Ht 5\' 4"  (1.626 m)   Wt 267 lb 3.2 oz (121.2 kg)   SpO2 95%   BMI 45.86 kg/m  BP Readings from Last 3 Encounters:  05/15/23 (!) 124/90  04/22/23 (!) 142/90  09/18/22 128/70   Wt Readings from Last 3 Encounters:  05/15/23 267 lb 3.2 oz (121.2 kg)  09/18/22 263 lb 9.6 oz (119.6 kg)  12/13/21 248 lb (112.5 kg)   SpO2 Readings from Last 3 Encounters:  05/15/23 95%  04/22/23 95%  09/18/22 95%      Physical Exam Vitals and nursing note reviewed.  Constitutional:      General: She is not in acute distress.    Appearance: Normal  appearance. She is well-developed.  HENT:     Head: Normocephalic and atraumatic.     Right Ear: Tympanic membrane, ear canal and external ear normal. There is no impacted cerumen.     Left Ear: Tympanic membrane, ear canal and external ear normal.  There is no impacted cerumen.     Nose: Nose normal.     Mouth/Throat:     Mouth: Mucous membranes are moist.     Pharynx: Oropharynx is clear. No oropharyngeal exudate or posterior oropharyngeal erythema.  Eyes:     General: No scleral icterus.       Right eye: No discharge.        Left eye: No discharge.     Conjunctiva/sclera: Conjunctivae normal.     Pupils: Pupils are equal, round, and reactive to light.  Neck:     Thyroid: No thyromegaly or thyroid tenderness.     Vascular: No JVD.  Cardiovascular:     Rate and Rhythm: Normal rate and regular rhythm.     Heart sounds: Normal heart sounds. No murmur heard. Pulmonary:     Effort: Pulmonary effort is normal. No respiratory distress.     Breath sounds: Normal breath sounds.  Abdominal:     General: Bowel sounds are normal. There is no distension.     Palpations: Abdomen is soft. There is no mass.     Tenderness: There is no abdominal tenderness. There is no guarding or rebound.  Genitourinary:    Vagina: Normal.  Musculoskeletal:        General: Normal range of motion.     Cervical back: Normal range of motion and neck supple.     Right lower leg: No edema.     Left lower leg: No edema.  Lymphadenopathy:     Cervical: No cervical adenopathy.  Skin:    General: Skin is warm and dry.     Findings: No erythema or rash.  Neurological:     Mental Status: She is alert and oriented to person, place, and time.     Cranial Nerves: No cranial nerve deficit.     Deep Tendon Reflexes: Reflexes are normal and symmetric.  Psychiatric:        Mood and Affect: Mood normal.        Behavior: Behavior normal.        Thought Content: Thought content normal.        Judgment: Judgment normal.       No results found for any visits on 05/15/23.  Last CBC Lab Results  Component Value Date   WBC 6.2 12/13/2021   HGB 13.7 12/13/2021   HCT 40.5 12/13/2021   MCV 94.3 12/13/2021   MCH 32.0 05/23/2021   RDW 12.4 12/13/2021   PLT 261.0 12/13/2021   Last metabolic panel Lab Results  Component Value Date   GLUCOSE 86 12/13/2021   NA 140 12/13/2021   K 4.0 12/13/2021   CL 102 12/13/2021   CO2 30 12/13/2021   BUN 20 12/13/2021   CREATININE 0.71 12/13/2021   GFR 90.79 12/13/2021   CALCIUM 9.8 12/13/2021   PROT 6.7 12/13/2021   ALBUMIN 4.2 12/13/2021   LABGLOB 2.4 09/11/2019   AGRATIO 1.7 09/11/2019   BILITOT 1.0 12/13/2021   ALKPHOS 80 12/13/2021   AST 20 12/13/2021   ALT 22 12/13/2021   ANIONGAP 6 05/23/2021   Last lipids Lab Results  Component Value Date   CHOL 168 12/13/2021   HDL 58.50 12/13/2021   LDLCALC 94 12/13/2021   TRIG 74.0 12/13/2021   CHOLHDL 3 12/13/2021   Last hemoglobin A1c Lab Results  Component Value Date   HGBA1C 5.3 09/11/2019   Last thyroid functions Lab Results  Component Value Date   TSH 1.58 12/13/2021   T3TOTAL 161  09/11/2019   Last vitamin D Lab Results  Component Value Date   VD25OH 57.85 12/13/2021   Last vitamin B12 and Folate Lab Results  Component Value Date   VITAMINB12 431 09/11/2019   FOLATE 13.3 09/10/2017      The 10-year ASCVD risk score (Arnett DK, et al., 2019) is: 5.5%    Assessment & Plan:   Problem List Items Addressed This Visit       Unprioritized   MORBID OBESITY   Relevant Medications   tirzepatide (ZEPBOUND) 2.5 MG/0.5ML injection vial   Other Relevant Orders   CBC with Differential/Platelet   Comprehensive metabolic panel   Lipid panel   TSH   Insulin, random   Vitamin B12   VITAMIN D 25 Hydroxy (Vit-D Deficiency, Fractures)   Preventative health care - Primary    Ghm utd Check labs  See AV[facility] Health Maintenance  Topic Date Due   HIV Screening  Never done   Zoster Vaccines-  Shingrix (1 of 2) Never done   COVID-19 Vaccine (3 - Pfizer risk series) 11/05/2019   MAMMOGRAM  08/01/2024   Colonoscopy  04/21/2026   DTaP/Tdap/Td (3 - Td or Tdap) 11/30/2030   INFLUENZA VACCINE  Completed   Hepatitis C Screening  Completed   HPV VACCINES  Aged Out        Assessment and Plan    Obesity Discussed weight loss options including GLP-1 agonists. Patient has previously tried [place] but found it too expensive. Discussed the option of using a compounded version or Zepbound from [employer], which is more affordable. -Sent prescription for Zepbound to [facility]. [facility] will contact the patient to arrange payment and delivery.  Tendonitis Patient reports pain in the wrist, likely due to computer use. Discussed the use of a splint to keep the joint stiff and reduce pain. -Recommended over-the-counter cortisone cream for local skin irritation. -Advised the use of a wrist splint available at local drugstores.  Dental Health Discussed the difficulty of finding good dental coverage. No specific plan was made regarding this issue.  Eye Health Discussed the use of [employer] for eye care coverage. No specific plan was made regarding this issue.  General Health Maintenance -Continue current medications as prescribed. -Check-in before needing a higher dose of Zepbound. -Follow-up as needed.        Return in about 3 months (around 08/15/2023), or weight.    Donato Schultz, DO

## 2023-05-15 NOTE — Patient Instructions (Signed)
Preventive Care 42-64 Years Old, Female Preventive care refers to lifestyle choices and visits with your health care provider that can promote health and wellness. Preventive care visits are also called wellness exams. What can I expect for my preventive care visit? Counseling Your health care provider may ask you questions about your: Medical history, including: Past medical problems. Family medical history. Pregnancy history. Current health, including: Menstrual cycle. Method of birth control. Emotional well-being. Home life and relationship well-being. Sexual activity and sexual health. Lifestyle, including: Alcohol, nicotine or tobacco, and drug use. Access to firearms. Diet, exercise, and sleep habits. Work and work Astronomer. Sunscreen use. Safety issues such as seatbelt and bike helmet use. Physical exam Your health care provider will check your: Height and weight. These may be used to calculate your BMI (body mass index). BMI is a measurement that tells if you are at a healthy weight. Waist circumference. This measures the distance around your waistline. This measurement also tells if you are at a healthy weight and may help predict your risk of certain diseases, such as type 2 diabetes and high blood pressure. Heart rate and blood pressure. Body temperature. Skin for abnormal spots. What immunizations do I need?  Vaccines are usually given at various ages, according to a schedule. Your health care provider will recommend vaccines for you based on your age, medical history, and lifestyle or other factors, such as travel or where you work. What tests do I need? Screening Your health care provider may recommend screening tests for certain conditions. This may include: Lipid and cholesterol levels. Diabetes screening. This is done by checking your blood sugar (glucose) after you have not eaten for a while (fasting). Pelvic exam and Pap test. Hepatitis B test. Hepatitis C  test. HIV (human immunodeficiency virus) test. STI (sexually transmitted infection) testing, if you are at risk. Lung cancer screening. Colorectal cancer screening. Mammogram. Talk with your health care provider about when you should start having regular mammograms. This may depend on whether you have a family history of breast cancer. BRCA-related cancer screening. This may be done if you have a family history of breast, ovarian, tubal, or peritoneal cancers. Bone density scan. This is done to screen for osteoporosis. Talk with your health care provider about your test results, treatment options, and if necessary, the need for more tests. Follow these instructions at home: Eating and drinking  Eat a diet that includes fresh fruits and vegetables, whole grains, lean protein, and low-fat dairy products. Take vitamin and mineral supplements as recommended by your health care provider. Do not drink alcohol if: Your health care provider tells you not to drink. You are pregnant, may be pregnant, or are planning to become pregnant. If you drink alcohol: Limit how much you have to 0-1 drink a day. Know how much alcohol is in your drink. In the [place] one drink equals one 12 oz bottle of beer (355 mL), one 5 oz glass of wine (148 mL), or one 1 oz glass of hard liquor (44 mL). Lifestyle Brush your teeth every morning and night with fluoride toothpaste. Floss one time each day. Exercise for at least 30 minutes 5 or more days each week. Do not use any products that contain nicotine or tobacco. These products include cigarettes, chewing tobacco, and vaping devices, such as e-cigarettes. If you need help quitting, ask your health care provider. Do not use drugs. If you are sexually active, practice safe sex. Use a condom or other form of protection to  prevent STIs. If you do not wish to become pregnant, use a form of birth control. If you plan to become pregnant, see your health care provider for a  prepregnancy visit. Take aspirin only as told by your health care provider. Make sure that you understand how much to take and what form to take. Work with your health care provider to find out whether it is safe and beneficial for you to take aspirin daily. Find healthy ways to manage stress, such as: Meditation, yoga, or listening to music. Journaling. Talking to a trusted person. Spending time with friends and family. Minimize exposure to UV radiation to reduce your risk of skin cancer. Safety Always wear your seat belt while driving or riding in a vehicle. Do not drive: If you have been drinking alcohol. Do not ride with someone who has been drinking. When you are tired or distracted. While texting. If you have been using any mind-altering substances or drugs. Wear a helmet and other protective equipment during sports activities. If you have firearms in your house, make sure you follow all gun safety procedures. Seek help if you have been physically or sexually abused. What's next? Visit your health care provider once a year for an annual wellness visit. Ask your health care provider how often you should have your eyes and teeth checked. Stay up to date on all vaccines. This information is not intended to replace advice given to you by your health care provider. Make sure you discuss any questions you have with your health care provider. Document Revised: 12/22/2020 Document Reviewed: 12/22/2020 Elsevier Patient Education  2024 [employer] Inc[employer] Calorie Counting for [employer] Loss Calories are units of energy. Your body needs a certain number of calories from food to keep going throughout the day. When you eat or drink more calories than your body needs, your body stores the extra calories mostly as fat. When you eat or drink fewer calories than your body needs, your body burns fat to get the energy it needs. Calorie counting means keeping track of how many calories you eat and drink each  day. Calorie counting can be helpful if you need to lose weight. If you eat fewer calories than your body needs, you should lose weight. Ask your health care provider what a healthy weight is for you. For calorie counting to work, you will need to eat the right number of calories each day to lose a healthy amount of weight per week. A dietitian can help you figure out how many calories you need in a day and will suggest ways to reach your calorie goal. A healthy amount of weight to lose each week is usually 1-2 lb (0.5-0.9 kg). This usually means that your daily calorie intake should be reduced by 500-750 calories. Eating 1,200-1,500 calories a day can help most women lose weight. Eating 1,500-1,800 calories a day can help most men lose weight. What do I need to know about calorie counting? Work with your health care provider or dietitian to determine how many calories you should get each day. To meet your daily calorie goal, you will need to: Find out how many calories are in each food that you would like to eat. Try to do this before you eat. Decide how much of the food you plan to eat. Keep a food log. Do this by writing down what you ate and how many calories it had. To successfully lose weight, it is important to balance calorie counting with a healthy lifestyle  that includes regular activity. Where do I find calorie information?  The number of calories in a food can be found on a Nutrition Facts label. If a food does not have a Nutrition Facts label, try to look up the calories online or ask your dietitian for help. Remember that calories are listed per serving. If you choose to have more than one serving of a food, you will have to multiply the calories per serving by the number of servings you plan to eat. For example, the label on a package of bread might say that a serving size is 1 slice and that there are 90 calories in a serving. If you eat 1 slice, you will have eaten 90 calories. If you  eat 2 slices, you will have eaten 180 calories. How do I keep a food log? After each time that you eat, record the following in your food log as soon as possible: What you ate. Be sure to include toppings, sauces, and other extras on the food. How much you ate. This can be measured in cups, ounces, or number of items. How many calories were in each food and drink. The total number of calories in the food you ate. Keep your food log near you, such as in a pocket-sized notebook or on an app or website on your mobile phone. Some programs will calculate calories for you and show you how many calories you have left to meet your daily goal. What are some portion-control tips? Know how many calories are in a serving. This will help you know how many servings you can have of a certain food. Use a measuring cup to measure serving sizes. You could also try weighing out portions on a kitchen scale. With time, you will be able to estimate serving sizes for some foods. Take time to put servings of different foods on your favorite plates or in your favorite bowls and cups so you know what a serving looks like. Try not to eat straight from a food's packaging, such as from a bag or box. Eating straight from the package makes it hard to see how much you are eating and can lead to overeating. Put the amount you would like to eat in a cup or on a plate to make sure you are eating the right portion. Use smaller plates, glasses, and bowls for smaller portions and to prevent overeating. Try not to multitask. For example, avoid watching TV or using your computer while eating. If it is time to eat, sit down at a table and enjoy your food. This will help you recognize when you are full. It will also help you be more mindful of what and how much you are eating. What are tips for following this plan? Reading food labels Check the calorie count compared with the serving size. The serving size may be smaller than what you are  used to eating. Check the source of the calories. Try to choose foods that are high in protein, fiber, and vitamins, and low in saturated fat, trans fat, and sodium. Shopping Read nutrition labels while you shop. This will help you make healthy decisions about which foods to buy. Pay attention to nutrition labels for low-fat or fat-free foods. These foods sometimes have the same number of calories or more calories than the full-fat versions. They also often have added sugar, starch, or salt to make up for flavor that was removed with the fat. Make a grocery list of lower-calorie foods and  stick to it. Cooking Try to cook your favorite foods in a healthier way. For example, try baking instead of frying. Use low-fat dairy products. Meal planning Use more fruits and vegetables. One-half of your plate should be fruits and vegetables. Include lean proteins, such as chicken, [place], and fish. Lifestyle Each week, aim to do one of the following: 150 minutes of moderate exercise, such as walking. 75 minutes of vigorous exercise, such as running. General information Know how many calories are in the foods you eat most often. This will help you calculate calorie counts faster. Find a way of tracking calories that works for you. Get creative. Try different apps or programs if writing down calories does not work for you. What foods should I eat?  Eat nutritious foods. It is better to have a nutritious, high-calorie food, such as an avocado, than a food with few nutrients, such as a bag of potato chips. Use your calories on foods and drinks that will fill you up and will not leave you hungry soon after eating. Examples of foods that fill you up are nuts and nut butters, vegetables, lean proteins, and high-fiber foods such as whole grains. High-fiber foods are foods with more than 5 g of fiber per serving. Pay attention to calories in drinks. Low-calorie drinks include water and unsweetened drinks. The  items listed above may not be a complete list of foods and beverages you can eat. Contact a dietitian for more information. What foods should I limit? Limit foods or drinks that are not good sources of vitamins, minerals, or protein or that are high in unhealthy fats. These include: Candy. Other sweets. Sodas, specialty coffee drinks, alcohol, and juice. The items listed above may not be a complete list of foods and beverages you should avoid. Contact a dietitian for more information. How do I count calories when eating out? Pay attention to portions. Often, portions are much larger when eating out. Try these tips to keep portions smaller: Consider sharing a meal instead of getting your own. If you get your own meal, eat only half of it. Before you start eating, ask for a container and put half of your meal into it. When available, consider ordering smaller portions from the menu instead of full portions. Pay attention to your food and drink choices. Knowing the way food is cooked and what is included with the meal can help you eat fewer calories. If calories are listed on the menu, choose the lower-calorie options. Choose dishes that include vegetables, fruits, whole grains, low-fat dairy products, and lean proteins. Choose items that are boiled, broiled, grilled, or steamed. Avoid items that are buttered, battered, fried, or served with cream sauce. Items labeled as crispy are usually fried, unless stated otherwise. Choose water, low-fat milk, unsweetened iced tea, or other drinks without added sugar. If you want an alcoholic beverage, choose a lower-calorie option, such as a glass of wine or light beer. Ask for dressings, sauces, and syrups on the side. These are usually high in calories, so you should limit the amount you eat. If you want a salad, choose a garden salad and ask for grilled meats. Avoid extra toppings such as bacon, cheese, or fried items. Ask for the dressing on the side, or ask  for olive oil and vinegar or lemon to use as dressing. Estimate how many servings of a food you are given. Knowing serving sizes will help you be aware of how much food you are eating at restaurants. Where  to find more information Centers for Disease Control and Prevention: [employer]Ex[employer].com.br U.[place]. Department of Agriculture: WrestlingRep[employer].dk Summary Calorie counting means keeping track of how many calories you eat and drink each day. If you eat fewer calories than your body needs, you should lose weight. A healthy amount of weight to lose per week is usually 1-2 lb (0.5-0.9 kg). This usually means reducing your daily calorie intake by 500-750 calories. The number of calories in a food can be found on a Nutrition Facts label. If a food does not have a Nutrition Facts label, try to look up the calories online or ask your dietitian for help. Use smaller plates, glasses, and bowls for smaller portions and to prevent overeating. Use your calories on foods and drinks that will fill you up and not leave you hungry shortly after a meal. This information is not intended to replace advice given to you by your health care provider. Make sure you discuss any questions you have with your health care provider. Document Revised: 08/07/2019 Document Reviewed: 08/07/2019 Elsevier Patient Education  2023 [employer].

## 2023-05-15 NOTE — Assessment & Plan Note (Signed)
Ghm utd Check labs  See [facility] Health Maintenance  Topic Date Due   HIV Screening  Never done   Zoster Vaccines- Shingrix (1 of 2) Never done   COVID-19 Vaccine (3 - Pfizer risk series) 11/05/2019   MAMMOGRAM  08/01/2024   Colonoscopy  04/21/2026   DTaP/Tdap/Td (3 - Td or Tdap) 11/30/2030   INFLUENZA VACCINE  Completed   Hepatitis C Screening  Completed   HPV VACCINES  Aged Out

## 2023-05-16 ENCOUNTER — Encounter: Payer: Self-pay | Admitting: Family Medicine

## 2023-05-16 LAB — INSULIN, RANDOM: Insulin: 32 u[IU]/mL — ABNORMAL HIGH

## 2023-05-17 ENCOUNTER — Encounter: Payer: Self-pay | Admitting: Family Medicine

## 2023-05-21 ENCOUNTER — Encounter: Payer: Self-pay | Admitting: Family Medicine

## 2023-05-21 MED ORDER — TIRZEPATIDE-WEIGHT MANAGEMENT 2.5 MG/0.5ML ~~LOC~~ SOLN: 2.5000 mg | SUBCUTANEOUS | 2 refills | Status: DC

## 2023-05-29 ENCOUNTER — Other Ambulatory Visit: Payer: Self-pay | Admitting: Family Medicine

## 2023-05-29 ENCOUNTER — Encounter: Payer: Self-pay | Admitting: Family Medicine

## 2023-05-29 DIAGNOSIS — Z1231 Encounter for screening mammogram for malignant neoplasm of breast: Secondary | ICD-10-CM

## 2023-06-12 ENCOUNTER — Other Ambulatory Visit: Payer: Self-pay | Admitting: Family Medicine

## 2023-06-12 ENCOUNTER — Encounter: Payer: Self-pay | Admitting: Family Medicine

## 2023-06-12 MED ORDER — TIRZEPATIDE-WEIGHT MANAGEMENT 5 MG/0.5ML ~~LOC~~ SOLN
5.0000 mg | SUBCUTANEOUS | 0 refills | Status: DC
Start: 2023-06-12 — End: 2023-12-06

## 2023-07-17 ENCOUNTER — Other Ambulatory Visit: Payer: Self-pay | Admitting: Family Medicine

## 2023-07-17 DIAGNOSIS — I10 Essential (primary) hypertension: Secondary | ICD-10-CM

## 2023-07-23 ENCOUNTER — Other Ambulatory Visit: Payer: Self-pay | Admitting: Family Medicine

## 2023-08-03 ENCOUNTER — Ambulatory Visit
Admission: RE | Admit: 2023-08-03 | Discharge: 2023-08-03 | Disposition: A | Discharge status: Home / Self Care | Payer: Managed Care, Other (non HMO) | Source: Ambulatory Visit | Attending: Family Medicine | Admitting: Family Medicine

## 2023-08-03 DIAGNOSIS — Z1231 Encounter for screening mammogram for malignant neoplasm of breast: Secondary | ICD-10-CM

## 2023-08-30 ENCOUNTER — Ambulatory Visit
Admission: RE | Admit: 2023-08-30 | Discharge: 2023-08-30 | Disposition: A | Discharge status: Home / Self Care | Payer: Managed Care, Other (non HMO) | Source: Ambulatory Visit | Attending: Family Medicine | Admitting: Family Medicine

## 2023-08-30 VITALS — BP 135/85 | HR 91 | Temp 99.1°F | Resp 20

## 2023-08-30 DIAGNOSIS — J329 Chronic sinusitis, unspecified: Secondary | ICD-10-CM

## 2023-08-30 DIAGNOSIS — J40 Bronchitis, not specified as acute or chronic: Secondary | ICD-10-CM | POA: Diagnosis not present

## 2023-08-30 MED ORDER — ALBUTEROL SULFATE HFA 108 (90 BASE) MCG/ACT IN AERS
1.0000 | INHALATION_SPRAY | Freq: Four times a day (QID) | RESPIRATORY_TRACT | 0 refills | Status: AC | PRN
Start: 2023-08-30 — End: ?

## 2023-08-30 MED ORDER — AMOXICILLIN-POT CLAVULANATE 875-125 MG PO TABS: 1.0000 | ORAL_TABLET | Freq: Two times a day (BID) | ORAL | 0 refills | Status: DC

## 2023-08-30 MED ORDER — PREDNISONE 20 MG PO TABS
ORAL_TABLET | ORAL | 0 refills | Status: DC
Start: 2023-08-30 — End: 2023-11-06

## 2023-08-30 MED ORDER — PROMETHAZINE-DM 6.25-15 MG/5ML PO SYRP
5.0000 mL | ORAL_SOLUTION | Freq: Three times a day (TID) | ORAL | 0 refills | Status: DC | PRN
Start: 2023-08-30 — End: 2023-11-06

## 2023-08-30 NOTE — ED Provider Notes (Signed)
 [facility]  Note:  This document was prepared using Conservation officer, historic buildings and may include unintentional dictation errors.  MRN: [id number] DOB: 06-Apr-1961  Subjective:   Finola Rosal is a 63 y.o. female presenting for 90 history of acute onset sinus congestion, sinus pain, throat congestion, productive cough with chest pain.  Has asthma.  Does not have an inhaler.  No current facility-administered medications for this encounter.  Current Outpatient Medications:    buPROPion (WELLBUTRIN XL) 300 MG 24 hr tablet, Take 1 tablet (300 mg total) by mouth daily., Disp: 90 tablet, Rfl: 3   cyclobenzaprine (FLEXERIL) 10 MG tablet, Take 1 tablet (10 mg total) by mouth at bedtime as needed for muscle spasms., Disp: 20 tablet, Rfl: 0   hydrochlorothiazide (HYDRODIURIL) 25 MG tablet, TAKE 1 TABLET (25 MG TOTAL) BY MOUTH DAILY., Disp: 90 tablet, Rfl: 1   ibuprofen (ADVIL) 200 MG tablet, Take 200 mg by mouth every 6 (six) hours as needed., Disp: , Rfl:    loratadine (CLARITIN) 10 MG tablet, Take 10 mg by mouth daily., Disp: , Rfl:    Magnesium 250 MG TABS, Take 1 tablet by mouth daily., Disp: , Rfl:    meloxicam (MOBIC) 15 MG tablet, Take 0.5-1 tablets (7.5-15 mg total) by mouth daily as needed for pain., Disp: 90 tablet, Rfl: 3   polyethylene glycol powder (GLYCOLAX/MIRALAX) 17 GM/SCOOP powder, Take 1 Container by mouth once., Disp: , Rfl:    polyethylene glycol powder (GLYCOLAX/MIRALAX) powder, Take 17 g by mouth daily. (Patient taking differently: Take 17 g by mouth daily as needed.), Disp: 3350 g, Rfl: 0   potassium chloride SA (KLOR-CON M20) 20 MEQ tablet, Take 1 tablet (20 mEq total) by mouth daily., Disp: 90 tablet, Rfl: 1   predniSONE (DELTASONE) 20 MG tablet, Take 2 tablets daily with breakfast., Disp: 10 tablet, Rfl: 0   Probiotic Product (ALIGN PO), Take 1 capsule by mouth daily., Disp: , Rfl:    silver sulfADIAZINE (SILVADENE) 1 % cream, Apply  1 Application topically daily., Disp: 100 g, Rfl: 0   tirzepatide 5 MG/0.5ML injection vial, Inject 5 mg into the skin once a week., Disp: 0.5 mL, Rfl: 0   vitamin B-12 (CYANOCOBALAMIN) 250 MCG tablet, Take 250 mcg by mouth daily., Disp: , Rfl:    Vitamin D, Ergocalciferol, (DRISDOL) 1.25 MG (50000 UNIT) CAPS capsule, TAKE 1 CAPSULE (50,000 UNITS TOTAL) BY MOUTH EVERY 7 (SEVEN) DAYS, Disp: 12 capsule, Rfl: 1   ZEPBOUND 2.5 MG/0.5ML injection vial, INJECT 0.5 ML (2.5 MG) UNDER THE SKIN ONCE WEEKLY, Disp: 2 mL, Rfl: 2   Zinc 100 MG TABS, Take by mouth., Disp: , Rfl:    No Known Allergies  Past Medical History:  Diagnosis Date   Asthma    Chiari malformation type II (HCC)    diagnosed when in high school   Chiari malformation type II (HCC)    Colon polyp 2006   (TUBULAR ADENOMA)--COLONOSCOPY-DR. HAYES   Constipation    Fluid retention    H/O seasonal allergies    Medial meniscus tear    left   Over weight    Ulcerative colitis [facility])      Past Surgical History:  Procedure Laterality Date   ABDOMINAL HYSTERECTOMY  1999   complete   CESAREAN SECTION  1995 and 1998   HERNIA REPAIR  2004   umbilical    Family History  Problem Relation Age of Onset   Heart disease Mother 64  2 MIs    Stroke Mother 40   Hypertension Mother    Cancer Mother        lung, lymphoma   Lung cancer Mother        was a smoker   Kidney disease Mother    Obesity Mother    COPD Father        smoker   Cancer Father        colon, lung prostate   Alcohol abuse Father    Hypertension Father    Obesity Father    Alzheimer's disease Father    Asthma Sister    Alcohol abuse Brother    Diabetes Paternal Uncle    Breast cancer Neg Hx     Social History   Tobacco Use   Smoking status: Never   Smokeless tobacco: Never  Vaping Use   Vaping status: Never Used  Substance Use Topics   Alcohol use: Yes    Comment: 1 glass of wine 1-2 x a month   Drug use: No    ROS   Objective:    Vitals: BP 135/85 (BP Location: Right Arm)   Pulse 91   Temp 99.1 F (37.3 C) (Oral)   Resp 20   SpO2 91%   Physical Exam Constitutional:      General: She is not in acute distress.    Appearance: Normal appearance. She is well-developed and normal weight. She is not ill-appearing, toxic-appearing or diaphoretic.  HENT:     Head: Normocephalic and atraumatic.     Right Ear: Tympanic membrane, ear canal and external ear normal. No drainage or tenderness. No middle ear effusion. There is no impacted cerumen. Tympanic membrane is not erythematous or bulging.     Left Ear: Tympanic membrane, ear canal and external ear normal. No drainage or tenderness.  No middle ear effusion. There is no impacted cerumen. Tympanic membrane is not erythematous or bulging.     Nose: Congestion and rhinorrhea present.     Mouth/Throat:     Mouth: Mucous membranes are moist. No oral lesions.     Pharynx: No pharyngeal swelling, oropharyngeal exudate, posterior oropharyngeal erythema or uvula swelling.     Tonsils: No tonsillar exudate or tonsillar abscesses.  Eyes:     General: No scleral icterus.       Right eye: No discharge.        Left eye: No discharge.     Extraocular Movements: Extraocular movements intact.     Right eye: Normal extraocular motion.     Left eye: Normal extraocular motion.     Conjunctiva/sclera: Conjunctivae normal.  Cardiovascular:     Rate and Rhythm: Normal rate and regular rhythm.     Heart sounds: Normal heart sounds. No murmur heard.    No friction rub. No gallop.  Pulmonary:     Effort: Pulmonary effort is normal. No respiratory distress.     Breath sounds: No stridor. Examination of the right-upper field reveals wheezing. Examination of the left-upper field reveals wheezing. Examination of the right-middle field reveals wheezing and rhonchi. Examination of the left-middle field reveals wheezing and rhonchi. Examination of the right-lower field reveals rhonchi.  Examination of the left-lower field reveals rhonchi. Wheezing and rhonchi present. No rales.  Chest:     Chest wall: No tenderness.  Musculoskeletal:     Cervical back: Normal range of motion and neck supple.  Lymphadenopathy:     Cervical: No cervical adenopathy.  Skin:    General: Skin is  warm and dry.  Neurological:     General: No focal deficit present.     Mental Status: She is alert and oriented to person, place, and time.  Psychiatric:        Mood and Affect: Mood normal.        Behavior: Behavior normal.     Assessment and Plan :   PDMP not reviewed this encounter.  1. Sinobronchitis    Will manage for sinobronchitis with Augmentin, prednisone and albuterol.  Use supportive care otherwise.  Will defer imaging for now.  Counseled patient on potential for adverse effects with medications prescribed/recommended today, ER and return-to-clinic precautions discussed, patient verbalized understanding.    Wallis Bamberg, [place] 08/30/23 1038

## 2023-08-30 NOTE — Discharge Instructions (Signed)
 Start Augmentin and prednisone for the sinobronchitis infection. Use albuterol to help with your breathing. Cough syrup as needed.

## 2023-08-30 NOTE — ED Triage Notes (Signed)
 Pt c/o cough, head/chest congestion, sore throat-sx started 9 days ago-NAD-steady gait

## 2023-09-09 ENCOUNTER — Other Ambulatory Visit: Payer: Self-pay | Admitting: Family Medicine

## 2023-09-09 DIAGNOSIS — F321 Major depressive disorder, single episode, moderate: Secondary | ICD-10-CM

## 2023-10-18 ENCOUNTER — Other Ambulatory Visit: Payer: Self-pay | Admitting: Family Medicine

## 2023-10-25 ENCOUNTER — Encounter: Payer: Self-pay | Admitting: Family Medicine

## 2023-10-29 MED ORDER — TIRZEPATIDE-WEIGHT MANAGEMENT 5 MG/0.5ML ~~LOC~~ SOLN: 5.0000 mg | SUBCUTANEOUS | 0 refills | Status: DC

## 2023-11-06 ENCOUNTER — Encounter: Payer: Self-pay | Admitting: Family Medicine

## 2023-11-06 ENCOUNTER — Ambulatory Visit: Admitting: Family Medicine

## 2023-11-06 ENCOUNTER — Ambulatory Visit (HOSPITAL_BASED_OUTPATIENT_CLINIC_OR_DEPARTMENT_OTHER)
Admission: RE | Admit: 2023-11-06 | Discharge: 2023-11-06 | Disposition: A | Discharge status: Home / Self Care | Source: Ambulatory Visit | Attending: Family Medicine | Admitting: Family Medicine

## 2023-11-06 VITALS — BP 136/84 | HR 78 | Temp 98.3°F | Resp 16 | Ht 64.0 in | Wt 251.6 lb

## 2023-11-06 DIAGNOSIS — M25512 Pain in left shoulder: Secondary | ICD-10-CM | POA: Diagnosis present

## 2023-11-06 DIAGNOSIS — M1712 Unilateral primary osteoarthritis, left knee: Secondary | ICD-10-CM

## 2023-11-06 DIAGNOSIS — G8929 Other chronic pain: Secondary | ICD-10-CM

## 2023-11-06 MED ORDER — MELOXICAM 15 MG PO TABS: 7.5000 mg | ORAL_TABLET | Freq: Every day | ORAL | 3 refills | Status: AC | PRN

## 2023-11-06 NOTE — Progress Notes (Signed)
 Established Patient Office Visit  Subjective   Patient ID: Audrey Duncan, female    DOB: 1960/07/16  Age: 63 y.o. MRN: [id number]  Chief Complaint  Patient presents with   Arm Pain    Left arm pain, x3 weeks, pain coming from shoulder    HPI Pt here c/o L shoulder pain x 1 month Discussed the use of AI scribe software for clinical note transcription with the patient, who gave verbal consent to proceed.  History of Present Illness Audrey Duncan "Audrey Duncan" is a 63 year old female who presents for medication refills and follow-up on multiple health issues.  She received a message from [facility] indicating that her prescription for Seferino Dade requires an alternative medication. Gemtesa, although not very effective, was the most effective option she had tried. She is unsure about the status of her prescription and mentions that it was sent in earlier today.  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 The tissue has not fully grown over. She was referred to an oncologist, who conducted labs, a bone survey, and a 24-hour urine test. The results indicated MGUS. She is scheduled to see the oncologist again in June for follow-up.  She underwent a bone survey that suggested a possible fracture in her femur. However, after two sets of MRIs, it was confirmed that there is no fracture, but arthritis was noted in her back and hips. She was taken off Prolia due to the osteonecrosis, and her doctors are considering alternative medications. She attributes the potential femur issue to prolonged bisphosphonate use.  She inquires about the need for a measles titer, as she never had measles as a child and received a booster in 1980. She provides a picture of her baby book records showing her vaccination history.  She has been experiencing a persistent cough since contracting COVID-19 in January, which was severe initially. She required five  antibiotics for a tooth issue around the same time. She has received all COVID-19 vaccinations.   Patient Active Problem List   Diagnosis Date Noted   Preventative health care 11/29/2020   Depression 08/12/2018   Insulin  resistance 07/25/2018   Vitamin D  deficiency 09/25/2017   Prediabetes 09/25/2017   Shortness of breath on exertion 09/10/2017   Other fatigue 09/10/2017   Hyperglycemia 09/10/2017   Chronic constipation 09/10/2017   Class 3 severe obesity with serious comorbidity and body mass index (BMI) of 45.0 to 49.9 in adult 09/10/2017   Severe obesity (BMI >= 40) (HCC) 06/12/2014   Lung nodule 11/19/2012   MORBID OBESITY 03/28/2010   POSTMENOPAUSAL STATUS 03/28/2010   Asthma, episodic with mucus plugs 06/14/2009   HYPOKALEMIA 02/21/2008   Essential hypertension 09/06/2007   Past Medical History:  Diagnosis Date   Asthma    Chiari malformation type II (HCC)    diagnosed when in high school   Chiari malformation type II (HCC)    Colon polyp 2006   (TUBULAR ADENOMA)--COLONOSCOPY-DR. HAYES   Constipation    Fluid retention    H/O seasonal allergies    Medial meniscus tear    left   Over weight    Ulcerative colitis [facility])    Past Surgical History:  Procedure Laterality Date   ABDOMINAL HYSTERECTOMY  1999   complete   CESAREAN SECTION  1995 and 1998   HERNIA REPAIR  2004   umbilical   Social History   Tobacco Use   Smoking status: Never   Smokeless  tobacco: Never  Vaping Use   Vaping status: Never Used  Substance Use Topics   Alcohol use: Yes    Comment: 1 glass of wine 1-2 x a month   Drug use: No   Social History   Socioeconomic History   Marital status: Married    Spouse name: Becky Bowels   Number of children: 2   Years of education: Not on file   Highest education level: Not on file  Occupational History    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  Tobacco Use   Smoking status: Never   Smokeless tobacco: Never  Vaping Use   Vaping status: Never Used  Substance and  Sexual Activity   Alcohol use: Yes    Comment: 1 glass of wine 1-2 x a month   Drug use: No   Sexual activity: Yes    Partners: Male    Birth control/protection: Surgical  Other Topics Concern   Not on file  Social History Narrative   Exercise --- bikes ,, walking    Social Drivers of Health   Financial Resource Strain: Not on file  Food Insecurity: Not on file  Transportation Needs: No Transportation Needs (10/27/2023)   Received from [employer]    In the past 12 months, has lack of reliable transportation kept you from medical appointments, meetings, work or from getting things needed for daily living? : No  Physical Activity: Not on file  Stress: Not on file  Social Connections: Not on file  Intimate Partner Violence: Not on file   Family Status  Relation Name Status   Mother  Deceased   Father  Alive   Sister  Alive   Brother  Alive   Pat Uncle  Alive   Neg Hx  (Not Specified)  No partnership data on file   Family History  Problem Relation Age of Onset   Heart disease Mother 38       2 MIs    Stroke Mother 30   Hypertension Mother    Cancer Mother        lung, lymphoma   Lung cancer Mother        was a smoker   Kidney disease Mother    Obesity Mother    COPD Father        smoker   Cancer Father        colon, lung prostate   Alcohol abuse Father    Hypertension Father    Obesity Father    Alzheimer's disease Father    Asthma Sister    Alcohol abuse Brother    Diabetes Paternal Uncle    Breast cancer Neg Hx    No Known Allergies    ROS    Objective:     BP 136/84 (BP Location: Right Arm, Patient Position: Sitting, Cuff Size: Large)   Pulse 78   Temp 98.3 F (36.8 C) (Oral)   Resp 16   Ht 5\' 4"  (1.626 m)   Wt 251 lb 9.6 oz (114.1 kg)   SpO2 98%   BMI 43.19 kg/m  BP Readings from Last 3 Encounters:  11/06/23 136/84  08/30/23 135/85  05/15/23 (!) 124/90   Wt Readings from Last 3 Encounters:  11/06/23 251 lb 9.6 oz  (114.1 kg)  05/15/23 267 lb 3.2 oz (121.2 kg)  09/18/22 263 lb 9.6 oz (119.6 kg)   SpO2 Readings from Last 3 Encounters:  11/06/23 98%  08/30/23 91%  05/15/23 95%      Physical Exam  No results found for any visits on 11/06/23.  Last CBC Lab Results  Component Value Date   WBC 5.5 05/15/2023   HGB 13.9 05/15/2023   HCT 43.2 05/15/2023   MCV 95.8 05/15/2023   MCH 32.0 05/23/2021   RDW 12.7 05/15/2023   PLT 291.0 05/15/2023   Last metabolic panel Lab Results  Component Value Date   GLUCOSE 96 05/15/2023   NA 143 05/15/2023   K 4.6 05/15/2023   CL 102 05/15/2023   CO2 34 (H) 05/15/2023   BUN 17 05/15/2023   CREATININE 0.90 05/15/2023   GFR 67.63 05/15/2023   CALCIUM 9.7 05/15/2023   PROT 6.6 05/15/2023   ALBUMIN 4.1 05/15/2023   LABGLOB 2.4 09/11/2019   AGRATIO 1.7 09/11/2019   BILITOT 0.8 05/15/2023   ALKPHOS 92 05/15/2023   AST 23 05/15/2023   ALT 26 05/15/2023   ANIONGAP 6 05/23/2021   Last lipids Lab Results  Component Value Date   CHOL 162 05/15/2023   HDL 56.40 05/15/2023   LDLCALC 91 05/15/2023   TRIG 72.0 05/15/2023   CHOLHDL 3 05/15/2023   Last hemoglobin A1c Lab Results  Component Value Date   HGBA1C 5.3 09/11/2019   Last thyroid  functions Lab Results  Component Value Date   TSH 1.37 05/15/2023   T3TOTAL 161 09/11/2019   Last vitamin D  Lab Results  Component Value Date   VD25OH 43.08 05/15/2023    The 10-year ASCVD risk score (Arnett DK, et al., 2019) is: 6.6%    Assessment & Plan:   Problem List Items Addressed This Visit   None Visit Diagnoses       Chronic left shoulder pain    -  Primary   Relevant Medications   meloxicam  (MOBIC ) 15 MG tablet   Other Relevant Orders   DG Shoulder Left (Completed)     Primary osteoarthritis of left knee       Relevant Medications   meloxicam  (MOBIC ) 15 MG tablet     Assessment and Plan Assessment & Plan Cough post-COVID   She has experienced a persistent cough since a COVID-19  infection in January.  COVID-19   She contracted COVID-19 in January after a beach trip exposure. Initial symptoms were severe but resolved. She is fully vaccinated and considering another booster. Recommend a COVID-19 booster shot as per guidelines for individuals over 65.  Arthritis   Arthritis is confirmed in her back and hips. A recent MRI showed no fractures but confirmed arthritis. She is not on arthritis medication due to previous osteonecrosis and prolonged bisphosphonate use. Follow up with Doctor Racheal Buddle next week to discuss potential medication options.  Osteonecrosis of jaw   Previously treated and released by a specialist in mid-March. Tissue is not fully grown over but is well-managed. Prolia was discontinued due to osteonecrosis.  MGUS (Monoclonal Gammopathy of Undetermined Significance)   Diagnosed following labs and a bone survey. The oncologist believes progression is unlikely. Regular follow-up every six months with the oncologist is planned. Provide a copy of lab results to Dr. Rosaline Coma and continue six-month follow-ups.  Hyperlipidemia   Managed with medication. Check cholesterol levels today.  Hypothyroidism   Managed with Synthroid. Check thyroid  levels today.  Measles immunity   There is concern about measles immunity due to recent news. She received a booster in 1980 due to lack of childhood infection. Consider checking measles titer to assess immunity, with uncertain insurance coverage.    No follow-ups on file.    Caeleb Batalla R Lowne  Chase, DO

## 2023-11-10 ENCOUNTER — Encounter: Payer: Self-pay | Admitting: Family Medicine

## 2023-11-10 NOTE — Patient Instructions (Signed)
Shoulder Pain Many things can cause shoulder pain, including: An injury to the shoulder. Overuse of the shoulder. Arthritis. The source of the pain can be: Inflammation. An injury to the shoulder joint. An injury to a tendon, ligament, or bone. Follow these instructions at home: Pay attention to changes in your symptoms. Let your health care provider know about them. Follow these instructions to relieve your pain. If you have a removable sling: Wear the sling as told by your provider. Remove it only as told by your provider. Check the skin around the sling every day. Tell your provider about any concerns. Loosen the sling if your fingers tingle, become numb, or become cold. Keep the sling clean. If the sling is not waterproof: Do not let it get wet. Remove it to shower or bathe. Move your arm as little as possible, but keep your hand moving to prevent swelling. Managing pain, stiffness, and swelling  If told, put ice on the painful area. If you have a removable sling or immobilizer, remove it as told by your provider. Put ice in a plastic bag. Place a towel between your skin and the bag. Leave the ice on for 20 minutes, 2-3 times a day. If your skin turns bright red, remove the ice right away to prevent skin damage. The risk of damage is higher if you cannot feel pain, heat, or cold. Move your fingers often to reduce stiffness and swelling. Squeeze a soft ball or a foam pad as much as possible. This helps to keep the shoulder from swelling. It also helps to strengthen the arm. General instructions Take over-the-counter and prescription medicines only as told by your provider. Exercise may help with pain management. Perform exercises if told by your provider. You may be referred to a physical therapist to help in your recovery process. Keep all follow-up visits in order to avoid any type of permanent shoulder disability or chronic pain problems. Contact a health care provider  if: Your pain is not relieved with medicines. New pain develops in your arm, hand, or fingers. You loosen your sling and your arm, hand, or fingers remain tingly, numb, swollen, or painful. Get help right away if: Your arm, hand, or fingers turn white or blue. This information is not intended to replace advice given to you by your health care provider. Make sure you discuss any questions you have with your health care provider. Document Revised: 01/27/2022 Document Reviewed: 01/27/2022 Elsevier Patient Education  2024 Elsevier Inc.  

## 2023-12-05 ENCOUNTER — Other Ambulatory Visit: Payer: Self-pay | Admitting: Family Medicine

## 2023-12-05 DIAGNOSIS — I10 Essential (primary) hypertension: Secondary | ICD-10-CM

## 2023-12-06 ENCOUNTER — Other Ambulatory Visit: Payer: Self-pay | Admitting: Family Medicine

## 2023-12-18 ENCOUNTER — Other Ambulatory Visit (HOSPITAL_COMMUNITY): Payer: Self-pay

## 2023-12-24 DIAGNOSIS — L820 Inflamed seborrheic keratosis: Secondary | ICD-10-CM | POA: Diagnosis not present

## 2023-12-24 DIAGNOSIS — D225 Melanocytic nevi of trunk: Secondary | ICD-10-CM | POA: Diagnosis not present

## 2024-01-02 ENCOUNTER — Encounter: Payer: Self-pay | Admitting: Family Medicine

## 2024-01-11 ENCOUNTER — Other Ambulatory Visit: Payer: Self-pay | Admitting: Family Medicine

## 2024-01-11 DIAGNOSIS — I10 Essential (primary) hypertension: Secondary | ICD-10-CM

## 2024-01-24 ENCOUNTER — Encounter: Payer: Self-pay | Admitting: Family Medicine

## 2024-01-24 DIAGNOSIS — M79602 Pain in left arm: Secondary | ICD-10-CM

## 2024-02-19 NOTE — Therapy (Signed)
 OUTPATIENT PHYSICAL THERAPY SHOULDER EVALUATION   Patient Name: Audrey Duncan MRN: [id number] DOB:1960-09-18, 63 y.o., female Today's Date: 02/20/2024  END OF SESSION:  PT End of Session - 02/20/24 1014     Visit Number 1    Authorization Type HTA    PT Start Time 1015    PT Stop Time 1100    PT Time Calculation (min) 45 min          Past Medical History:  Diagnosis Date   Asthma    Chiari malformation type II (HCC)    diagnosed when in high school   Chiari malformation type II (HCC)    Colon polyp 2006   (TUBULAR ADENOMA)--COLONOSCOPY-DR. HAYES   Constipation    Fluid retention    H/O seasonal allergies    Medial meniscus tear    left   Over weight    Ulcerative colitis (HCC)    Past Surgical History:  Procedure Laterality Date   ABDOMINAL HYSTERECTOMY  1999   complete   CESAREAN SECTION  1995 and 1998   HERNIA REPAIR  2004   umbilical   Patient Active Problem List   Diagnosis Date Noted   Preventative health care 11/29/2020   Depression 08/12/2018   Insulin  resistance 07/25/2018   Vitamin D  deficiency 09/25/2017   Prediabetes 09/25/2017   Shortness of breath on exertion 09/10/2017   Other fatigue 09/10/2017   Hyperglycemia 09/10/2017   Chronic constipation 09/10/2017   Class 3 severe obesity with serious comorbidity and body mass index (BMI) of 45.0 to 49.9 in adult 09/10/2017   Severe obesity (BMI >= 40) (HCC) 06/12/2014   Lung nodule 11/19/2012   MORBID OBESITY 03/28/2010   POSTMENOPAUSAL STATUS 03/28/2010   Asthma, episodic with mucus plugs 06/14/2009   HYPOKALEMIA 02/21/2008   Essential hypertension 09/06/2007    PCP: Jamee Cyndee Shanks  REFERRING PROVIDER: Kenney Roys  REFERRING DIAG:  [phone] (ICD-10-CM) - Left arm pain      THERAPY DIAG:  Chronic left shoulder pain  Stiffness of left shoulder, not elsewhere classified  Impingement of left shoulder  Rationale for Evaluation and Treatment: Rehabilitation  ONSET  DATE: 01/31/24  SUBJECTIVE:                                                                                                                                                                                      SUBJECTIVE STATEMENT: I have no motion in the arm. I can't reach over head or behind me. I cannot push through it. My neighbor thinks I have frozen shoulder. I am using a TENS unit every night, it seems to feel good although the pain is not  gone.    PERTINENT HISTORY: See above for PMH  PAIN:  Are you having pain? Yes: NPRS scale: 10/10 when raising it  Pain location: L shoulder and into upper arm  Pain description: sharp when I move it, throbbing  Aggravating factors: reaching over head, behind the back  Relieving factors: TENS unit makes it feel better, muscle relaxer   PRECAUTIONS: None  RED FLAGS: None   WEIGHT BEARING RESTRICTIONS: No  FALLS:  Has patient fallen in last 6 months? No  LIVING ENVIRONMENT: Lives with: lives with their spouse Lives in: House/apartment  OCCUPATION: Retired  PLOF: Independent and Independent with basic ADLs  PATIENT GOALS:To have no pain and use my arm again  NEXT MD VISIT:   OBJECTIVE:  Note: Objective measures were completed at Evaluation unless otherwise noted.  DIAGNOSTIC FINDINGS:  There is no evidence of fracture or dislocation. Glenohumeral joint space is preserved. Minimal acromioclavicular spurring. Small subacromial spur. No evidence of erosion or focal bone abnormality. Soft tissues are unremarkable.   IMPRESSION: Minimal acromioclavicular spurring. Small subacromial spur.  PATIENT SURVEYS:  QuickDash   COGNITION: Overall cognitive status: Within functional limits for tasks assessed     SENSATION: WFL   UPPER EXTREMITY ROM:   Active ROM Right eval Left Eval SEATED  Shoulder flexion  120 p!  Shoulder extension    Shoulder abduction  90 p!  Shoulder adduction    Shoulder internal rotation  20 p!  L3   Shoulder external rotation   40!  Base of skull  Elbow flexion    Elbow extension    Wrist flexion    Wrist extension    Wrist ulnar deviation    Wrist radial deviation    Wrist pronation    Wrist supination    (Blank rows = not tested)  UPPER EXTREMITY MMT: 4+/5 all L shoulder mm    SHOULDER SPECIAL TESTS: Impingement tests: Neer impingement test: positive , Hawkins/Kennedy impingement test: positive , and Painful arc test: positive   Rotator cuff assessment: Empty can test: positive   JOINT MOBILITY TESTING:  Close to full range with ER/IR, very guarded and has pain with abduction and flexion PROM   PALPATION:  No TTP but tightness around shoulder joints                                                                                                                              TREATMENT DATE:  02/20/24 EVAL    PATIENT EDUCATION: Education details: POC and HEP  Person educated: Patient Education method: Explanation Education comprehension: verbalized understanding and returned demonstration  HOME EXERCISE PROGRAM: Access Code: [email or web address] URL: [email or web address] Date: 02/20/2024 Prepared by: Almetta Fam  Exercises - Standing Shoulder Extension with Dowel  - 1 x daily - 7 x weekly - 2 sets - 10 reps - 3 hold - Standing Bilateral Shoulder Internal Rotation AAROM with Dowel  - 1 x daily - 7 x weekly - 2  sets - 10 reps - 3 hold - Standing Shoulder Abduction AAROM with Dowel  - 1 x daily - 7 x weekly - 2 sets - 10 reps - 3 hold - Standing Shoulder Flexion AAROM with Dowel  - 1 x daily - 7 x weekly - 2 sets - 10 reps - 3 hold - Standing Shoulder External Rotation AAROM with Dowel  - 1 x daily - 7 x weekly - 2 sets - 10 reps - 3 hold  ASSESSMENT:  CLINICAL IMPRESSION: Patient is a 63 y.o. female who was seen today for physical therapy evaluation and treatment for L shoulder pain. Her pain has been ongoing for about 3 months now. Her symptoms are in line  with possible shoulder impingement and maybe adhesive capsulitis but the capsular pattern is not exact. She reports she can't reach over to to do hair or shampoo with LUE, also cannot reach behind back to hook her bra. Pt is missing range in all direction but is very guarded with PROM especially into abduction and flexion. She does have decent range into ER/IR passively. Her strength is good. Patient will benefit from skilled PT to address her L shoulder pain and ROM deficits to improve ease with ADLs and be able to lift up her new grandson who will be born next week.   OBJECTIVE IMPAIRMENTS: decreased ROM, decreased strength, impaired UE functional use, and pain.   ACTIVITY LIMITATIONS: carrying, lifting, reach over head, and hygiene/grooming  PARTICIPATION LIMITATIONS: cleaning, laundry, and yard work  PERSONAL FACTORS: Time since onset of injury/illness/exacerbation are also affecting patient's functional outcome.   REHAB POTENTIAL: Good  CLINICAL DECISION MAKING: Stable/uncomplicated  EVALUATION COMPLEXITY: Low  GOALS: Goals reviewed with patient? Yes  SHORT TERM GOALS: Target date: 04/02/24  Patient will be independent with initial HEP.  Baseline:  Goal status: INITIAL  2.  Patient will be able to tolerate PROM into full ranges for all directions   Baseline: very guarded with flexion and abd Goal status: INITIAL   LONG TERM GOALS: Target date: 05/14/24  Patient will be independent with advanced/ongoing HEP to improve outcomes and carryover.  Baseline:  Goal status: INITIAL  2.  Patient will report 75% improvement in L shoulder pain to improve QOL.  Baseline: 10/10 with movement  Goal status: INITIAL  3.  Patient to improve L shoulder AROM to [facility] without pain provocation to allow for increased ease of ADLs.  Baseline: see chart Goal status: INITIAL  4.  Patient will be able to wash and do her hair and hook her bra without pain and difficulty.  Baseline:  Goal status:  INITIAL  5.  Patient will report 10 points improvement on QuickDash to demonstrate improved functional ability.  Baseline: not done at eval Goal status: INITIAL   PLAN:  PT FREQUENCY: 2x/week  PT DURATION: 12 weeks  PLANNED INTERVENTIONS: 97110-Therapeutic exercises, 97530- Therapeutic activity, 97112- Neuromuscular re-education, 97535- Self Care, [id number]- Manual therapy, [id number]- Electrical stimulation (unattended), [phone]- Ionotophoresis 4mg /ml Dexamethasone, 79439 (1-2 muscles), [id number] (3+ muscles)- Dry Needling, Patient/Family education, Taping, Joint mobilization, Joint manipulation, Spinal manipulation, Spinal mobilization, Cryotherapy, and Moist heat  PLAN FOR NEXT SESSION: L shoulder PROM, AAROM, light strengthening for postural, Do QuickDash and add into goal    [facility], PT 02/20/2024, 11:02 AM

## 2024-02-20 ENCOUNTER — Ambulatory Visit: Payer: Self-pay | Attending: Family

## 2024-02-20 DIAGNOSIS — M79602 Pain in left arm: Secondary | ICD-10-CM | POA: Insufficient documentation

## 2024-02-20 DIAGNOSIS — G8929 Other chronic pain: Secondary | ICD-10-CM | POA: Diagnosis not present

## 2024-02-20 DIAGNOSIS — M25812 Other specified joint disorders, left shoulder: Secondary | ICD-10-CM | POA: Insufficient documentation

## 2024-02-20 DIAGNOSIS — M25612 Stiffness of left shoulder, not elsewhere classified: Secondary | ICD-10-CM | POA: Insufficient documentation

## 2024-02-20 DIAGNOSIS — M25512 Pain in left shoulder: Secondary | ICD-10-CM | POA: Insufficient documentation

## 2024-02-26 ENCOUNTER — Ambulatory Visit

## 2024-02-26 DIAGNOSIS — M25812 Other specified joint disorders, left shoulder: Secondary | ICD-10-CM

## 2024-02-26 DIAGNOSIS — M25612 Stiffness of left shoulder, not elsewhere classified: Secondary | ICD-10-CM

## 2024-02-26 DIAGNOSIS — M25512 Pain in left shoulder: Secondary | ICD-10-CM | POA: Diagnosis not present

## 2024-02-26 DIAGNOSIS — G8929 Other chronic pain: Secondary | ICD-10-CM

## 2024-02-26 NOTE — Therapy (Signed)
 OUTPATIENT PHYSICAL THERAPY SHOULDER TREATMENT   Patient Name: Audrey Duncan MRN: [id number] DOB:02/17/1961, 63 y.o., female Today's Date: 02/26/2024  END OF SESSION:  PT End of Session - 02/26/24 1630     Visit Number 2    Date for PT Re-Evaluation 05/14/24    Authorization Type HTA    PT Start Time 1630    PT Stop Time 1715    PT Time Calculation (min) 45 min           Past Medical History:  Diagnosis Date   Asthma    Chiari malformation type II (HCC)    diagnosed when in high school   Chiari malformation type II (HCC)    Colon polyp 2006   (TUBULAR ADENOMA)--COLONOSCOPY-DR. HAYES   Constipation    Fluid retention    H/O seasonal allergies    Medial meniscus tear    left   Over weight    Ulcerative colitis (HCC)    Past Surgical History:  Procedure Laterality Date   ABDOMINAL HYSTERECTOMY  1999   complete   CESAREAN SECTION  1995 and 1998   HERNIA REPAIR  2004   umbilical   Patient Active Problem List   Diagnosis Date Noted   Preventative health care 11/29/2020   Depression 08/12/2018   Insulin  resistance 07/25/2018   Vitamin D  deficiency 09/25/2017   Prediabetes 09/25/2017   Shortness of breath on exertion 09/10/2017   Other fatigue 09/10/2017   Hyperglycemia 09/10/2017   Chronic constipation 09/10/2017   Class 3 severe obesity with serious comorbidity and body mass index (BMI) of 45.0 to 49.9 in adult 09/10/2017   Severe obesity (BMI >= 40) (HCC) 06/12/2014   Lung nodule 11/19/2012   MORBID OBESITY 03/28/2010   POSTMENOPAUSAL STATUS 03/28/2010   Asthma, episodic with mucus plugs 06/14/2009   HYPOKALEMIA 02/21/2008   Essential hypertension 09/06/2007    PCP: Jamee Cyndee Shanks  REFERRING PROVIDER: Kenney Roys  REFERRING DIAG:  [phone] (ICD-10-CM) - Left arm pain      THERAPY DIAG:  Chronic left shoulder pain  Impingement of left shoulder  Stiffness of left shoulder, not elsewhere classified  Rationale for Evaluation  and Treatment: Rehabilitation  ONSET DATE: 01/31/24  SUBJECTIVE:                                                                                                                                                                                      SUBJECTIVE STATEMENT: Shoulder feels about the same. It still hurts. I'm doing the exercises, stopping when they start to hurt.   PERTINENT HISTORY: See above for PMH  PAIN:  Are you having pain? Yes: NPRS scale:  10/10 when raising it  Pain location: L shoulder and into upper arm  Pain description: sharp when I move it, throbbing  Aggravating factors: reaching over head, behind the back  Relieving factors: TENS unit makes it feel better, muscle relaxer   PRECAUTIONS: None  RED FLAGS: None   WEIGHT BEARING RESTRICTIONS: No  FALLS:  Has patient fallen in last 6 months? No  LIVING ENVIRONMENT: Lives with: lives with their spouse Lives in: House/apartment  OCCUPATION: Retired  PLOF: Independent and Independent with basic ADLs  PATIENT GOALS:To have no pain and use my arm again  NEXT MD VISIT:   OBJECTIVE:  Note: Objective measures were completed at Evaluation unless otherwise noted.  DIAGNOSTIC FINDINGS:  There is no evidence of fracture or dislocation. Glenohumeral joint space is preserved. Minimal acromioclavicular spurring. Small subacromial spur. No evidence of erosion or focal bone abnormality. Soft tissues are unremarkable.   IMPRESSION: Minimal acromioclavicular spurring. Small subacromial spur.  PATIENT SURVEYS:  QuickDash   COGNITION: Overall cognitive status: Within functional limits for tasks assessed     SENSATION: WFL   UPPER EXTREMITY ROM:   Active ROM Right eval Left Eval SEATED  Shoulder flexion  120 p!  Shoulder extension    Shoulder abduction  90 p!  Shoulder adduction    Shoulder internal rotation  20 p!  L3  Shoulder external rotation   40!  Base of skull  Elbow flexion    Elbow  extension    Wrist flexion    Wrist extension    Wrist ulnar deviation    Wrist radial deviation    Wrist pronation    Wrist supination    (Blank rows = not tested)  UPPER EXTREMITY MMT: 4+/5 all L shoulder mm    SHOULDER SPECIAL TESTS: Impingement tests: Neer impingement test: positive , Hawkins/Kennedy impingement test: positive , and Painful arc test: positive   Rotator cuff assessment: Empty can test: positive   JOINT MOBILITY TESTING:  Close to full range with ER/IR, very guarded and has pain with abduction and flexion PROM   PALPATION:  No TTP but tightness around shoulder joint                                                                                                                             TREATMENT DATE:  02/26/24 UBE L1 x2 mins each way Finger ladder x5 Red band rows and ext 2x10 Wall slides AAROM with dowel all directions 2x10 Scapular retraction 2x10  PROM with end range holds, joint mobilizations posterior and inferior grade 3-4 IR/ER red 2x10 IR with towel stretch x10 5s holds   02/20/24 EVAL    PATIENT EDUCATION: Education details: POC and HEP  Person educated: Patient Education method: Explanation Education comprehension: verbalized understanding and returned demonstration  HOME EXERCISE PROGRAM: Access Code: [email or web address] URL: [email or web address] Date: 02/20/2024 Prepared by: Almetta Fam  Exercises - Standing Shoulder Extension with Dowel  - 1 x daily - 7  x weekly - 2 sets - 10 reps - 3 hold - Standing Bilateral Shoulder Internal Rotation AAROM with Dowel  - 1 x daily - 7 x weekly - 2 sets - 10 reps - 3 hold - Standing Shoulder Abduction AAROM with Dowel  - 1 x daily - 7 x weekly - 2 sets - 10 reps - 3 hold - Standing Shoulder Flexion AAROM with Dowel  - 1 x daily - 7 x weekly - 2 sets - 10 reps - 3 hold - Standing Shoulder External Rotation AAROM with Dowel  - 1 x daily - 7 x weekly - 2 sets - 10 reps - 3  hold  ASSESSMENT:  CLINICAL IMPRESSION: Patient is a 63 y.o. female who was seen today for physical therapy treatment for L shoulder pain. Her pain has been ongoing for about 3 months now. Her symptoms are in line with possible shoulder impingement and maybe adhesive capsulitis but the capsular pattern is not exact. Pt is still missing range in all directions but is very guarded with PROM especially into abduction and flexion. We continued to work on regaining ROM and added in some light strengthening. Patient will benefit from skilled PT to address her L shoulder pain and ROM deficits to improve ease with ADLs and be able to lift up her new grandson who will be born next week.   OBJECTIVE IMPAIRMENTS: decreased ROM, decreased strength, impaired UE functional use, and pain.   ACTIVITY LIMITATIONS: carrying, lifting, reach over head, and hygiene/grooming  PARTICIPATION LIMITATIONS: cleaning, laundry, and yard work  PERSONAL FACTORS: Time since onset of injury/illness/exacerbation are also affecting patient's functional outcome.   REHAB POTENTIAL: Good  CLINICAL DECISION MAKING: Stable/uncomplicated  EVALUATION COMPLEXITY: Low  GOALS: Goals reviewed with patient? Yes  SHORT TERM GOALS: Target date: 04/02/24  Patient will be independent with initial HEP.  Baseline:  Goal status: INITIAL  2.  Patient will be able to tolerate PROM into full ranges for all directions   Baseline: very guarded with flexion and abd Goal status: INITIAL   LONG TERM GOALS: Target date: 05/14/24  Patient will be independent with advanced/ongoing HEP to improve outcomes and carryover.  Baseline:  Goal status: INITIAL  2.  Patient will report 75% improvement in L shoulder pain to improve QOL.  Baseline: 10/10 with movement  Goal status: INITIAL  3.  Patient to improve L shoulder AROM to [facility] without pain provocation to allow for increased ease of ADLs.  Baseline: see chart Goal status: INITIAL  4.   Patient will be able to wash and do her hair and hook her bra without pain and difficulty.  Baseline:  Goal status: INITIAL  5.  Patient will report 10 points improvement on QuickDash to demonstrate improved functional ability.  Baseline: not done at eval Goal status: INITIAL   PLAN:  PT FREQUENCY: 2x/week  PT DURATION: 12 weeks  PLANNED INTERVENTIONS: 97110-Therapeutic exercises, 97530- Therapeutic activity, 97112- Neuromuscular re-education, 97535- Self Care, 02859- Manual therapy, G0283- Electrical stimulation (unattended), [phone]- Ionotophoresis 4mg /ml Dexamethasone, 79439 (1-2 muscles), 20561 (3+ muscles)- Dry Needling, Patient/Family education, Taping, Joint mobilization, Joint manipulation, Spinal manipulation, Spinal mobilization, Cryotherapy, and Moist heat  PLAN FOR NEXT SESSION: L shoulder PROM, AAROM, light strengthening for postural, Do QuickDash and add into goal    [facility], PT 02/26/2024, 5:08 PM

## 2024-03-11 ENCOUNTER — Ambulatory Visit: Attending: Family | Admitting: Physical Therapy

## 2024-03-11 DIAGNOSIS — G8929 Other chronic pain: Secondary | ICD-10-CM | POA: Diagnosis not present

## 2024-03-11 DIAGNOSIS — M25612 Stiffness of left shoulder, not elsewhere classified: Secondary | ICD-10-CM | POA: Insufficient documentation

## 2024-03-11 DIAGNOSIS — M25812 Other specified joint disorders, left shoulder: Secondary | ICD-10-CM | POA: Diagnosis not present

## 2024-03-11 DIAGNOSIS — M25512 Pain in left shoulder: Secondary | ICD-10-CM | POA: Insufficient documentation

## 2024-03-11 NOTE — Therapy (Signed)
 OUTPATIENT PHYSICAL THERAPY SHOULDER TREATMENT   Patient Name: Audrey Duncan MRN: [id number] DOB:07/10/1961, 63 y.o., female Today's Date: 03/11/2024  END OF SESSION:  PT End of Session - 03/11/24 0853     Visit Number 3    Date for PT Re-Evaluation 05/14/24    Authorization Type HTA    PT Start Time [phone]    PT Stop Time 0930    PT Time Calculation (min) 38 min           Past Medical History:  Diagnosis Date   Asthma    Chiari malformation type II (HCC)    diagnosed when in high school   Chiari malformation type II (HCC)    Colon polyp 2006   (TUBULAR ADENOMA)--COLONOSCOPY-DR. HAYES   Constipation    Fluid retention    H/O seasonal allergies    Medial meniscus tear    left   Over weight    Ulcerative colitis (HCC)    Past Surgical History:  Procedure Laterality Date   ABDOMINAL HYSTERECTOMY  1999   complete   CESAREAN SECTION  1995 and 1998   HERNIA REPAIR  2004   umbilical   Patient Active Problem List   Diagnosis Date Noted   Preventative health care 11/29/2020   Depression 08/12/2018   Insulin  resistance 07/25/2018   Vitamin D  deficiency 09/25/2017   Prediabetes 09/25/2017   Shortness of breath on exertion 09/10/2017   Other fatigue 09/10/2017   Hyperglycemia 09/10/2017   Chronic constipation 09/10/2017   Class 3 severe obesity with serious comorbidity and body mass index (BMI) of 45.0 to 49.9 in adult 09/10/2017   Severe obesity (BMI >= 40) (HCC) 06/12/2014   Lung nodule 11/19/2012   MORBID OBESITY 03/28/2010   POSTMENOPAUSAL STATUS 03/28/2010   Asthma, episodic with mucus plugs 06/14/2009   HYPOKALEMIA 02/21/2008   Essential hypertension 09/06/2007    PCP: Jamee Cyndee Shanks  REFERRING PROVIDER: Kenney Roys  REFERRING DIAG:  [phone] (ICD-10-CM) - Left arm pain      THERAPY DIAG:  Chronic left shoulder pain  Impingement of left shoulder  Stiffness of left shoulder, not elsewhere classified  Rationale for Evaluation  and Treatment: Rehabilitation  ONSET DATE: 01/31/24  SUBJECTIVE:                                                                                                                                                                                      SUBJECTIVE STATEMENT: had to reach to grab dog and almost passed out last Thursday, not doing as well since  PERTINENT HISTORY: See above for PMH  PAIN:  Are you having pain? Left shld 8/10 with use,  1-2 at rest  PRECAUTIONS: None  RED FLAGS: None   WEIGHT BEARING RESTRICTIONS: No  FALLS:  Has patient fallen in last 6 months? No  LIVING ENVIRONMENT: Lives with: lives with their spouse Lives in: House/apartment  OCCUPATION: Retired  PLOF: Independent and Independent with basic ADLs  PATIENT GOALS:To have no pain and use my arm again  NEXT MD VISIT:   OBJECTIVE:  Note: Objective measures were completed at Evaluation unless otherwise noted.  DIAGNOSTIC FINDINGS:  There is no evidence of fracture or dislocation. Glenohumeral joint space is preserved. Minimal acromioclavicular spurring. Small subacromial spur. No evidence of erosion or focal bone abnormality. Soft tissues are unremarkable.   IMPRESSION: Minimal acromioclavicular spurring. Small subacromial spur.  PATIENT SURVEYS:  QuickDash   COGNITION: Overall cognitive status: Within functional limits for tasks assessed     SENSATION: [facility]   UPPER EXTREMITY ROM:   Active ROM Right eval Left Eval SEATED Left 03/11/24 standing  Shoulder flexion  120 p! 132 p!  Shoulder extension     Shoulder abduction  90 p! 95p!  Shoulder adduction     Shoulder internal rotation  20 p!  L3   Shoulder external rotation   40!  Base of skull Same p!  Elbow flexion     Elbow extension     Wrist flexion     Wrist extension     Wrist ulnar deviation     Wrist radial deviation     Wrist pronation     Wrist supination     (Blank rows = not tested)  UPPER EXTREMITY MMT: 4+/5  all L shoulder mm    SHOULDER SPECIAL TESTS: Impingement tests: Neer impingement test: positive , Hawkins/Kennedy impingement test: positive , and Painful arc test: positive   Rotator cuff assessment: Empty can test: positive   JOINT MOBILITY TESTING:  Close to full range with ER/IR, very guarded and has pain with abduction and flexion PROM   PALPATION:  No TTP but tightness around shoulder joint                                                                                                                             TREATMENT DATE:   03/11/24 UBE L 2 2 min each way Checked ROM and goals PROM and jt mobs Left shld supine SL 2# ER and abd 10 x the 3# 10 x Supine chest press and flexion 2# 2 sets 10 2# empty can and PNF 2 sets 10 Ionto left shld 1.2 cc dex 4 hour leave on patch 80mA    02/26/24 UBE L1 x2 mins each way Finger ladder x5 Red band rows and ext 2x10 Wall slides AAROM with dowel all directions 2x10 Scapular retraction 2x10  PROM with end range holds, joint mobilizations posterior and inferior grade 3-4 IR/ER red 2x10 IR with towel stretch x10 5s holds   02/20/24 EVAL    PATIENT EDUCATION: Education details: POC and HEP  Person  educated: Patient Education method: Explanation Education comprehension: verbalized understanding and returned demonstration  HOME EXERCISE PROGRAM: Access Code: [email or web address] URL: [email or web address] Date: 02/20/2024 Prepared by: Almetta Fam  Exercises - Standing Shoulder Extension with Dowel  - 1 x daily - 7 x weekly - 2 sets - 10 reps - 3 hold - Standing Bilateral Shoulder Internal Rotation AAROM with Dowel  - 1 x daily - 7 x weekly - 2 sets - 10 reps - 3 hold - Standing Shoulder Abduction AAROM with Dowel  - 1 x daily - 7 x weekly - 2 sets - 10 reps - 3 hold - Standing Shoulder Flexion AAROM with Dowel  - 1 x daily - 7 x weekly - 2 sets - 10 reps - 3 hold - Standing Shoulder External Rotation AAROM with Dowel  - 1  x daily - 7 x weekly - 2 sets - 10 reps - 3 hold  ASSESSMENT:  CLINICAL IMPRESSION: assessed goals and ROM, progressed ex for ROM and strength with cuing . PROM with cuing ( verb and tactile)to relax  OBJECTIVE IMPAIRMENTS: decreased ROM, decreased strength, impaired UE functional use, and pain.   ACTIVITY LIMITATIONS: carrying, lifting, reach over head, and hygiene/grooming  PARTICIPATION LIMITATIONS: cleaning, laundry, and yard work  PERSONAL FACTORS: Time since onset of injury/illness/exacerbation are also affecting patient's functional outcome.   REHAB POTENTIAL: Good  CLINICAL DECISION MAKING: Stable/uncomplicated  EVALUATION COMPLEXITY: Low  GOALS: Goals reviewed with patient? Yes  SHORT TERM GOALS: Target date: 04/02/24  Patient will be independent with initial HEP.  Baseline:  Goal status: 03/11/24 MET  2.  Patient will be able to tolerate PROM into full ranges for all directions   Baseline: very guarded with flexion and abd Goal status: progressing 03/11/24   LONG TERM GOALS: Target date: 05/14/24  Patient will be independent with advanced/ongoing HEP to improve outcomes and carryover.  Baseline:  Goal status: INITIAL  2.  Patient will report 75% improvement in L shoulder pain to improve QOL.  Baseline: 10/10 with movement  Goal status: INITIAL  3.  Patient to improve L shoulder AROM to [facility] without pain provocation to allow for increased ease of ADLs.  Baseline: see chart Goal status: INITIAL  4.  Patient will be able to wash and do her hair and hook her bra without pain and difficulty.  Baseline:  Goal status: INITIAL  5.  Patient will report 10 points improvement on QuickDash to demonstrate improved functional ability.  Baseline: not done at eval Goal status: INITIAL   PLAN:  PT FREQUENCY: 2x/week  PT DURATION: 12 weeks  PLANNED INTERVENTIONS: 97110-Therapeutic exercises, 97530- Therapeutic activity, 97112- Neuromuscular re-education, 97535- Self  Care, 02859- Manual therapy, G0283- Electrical stimulation (unattended), [phone]- Ionotophoresis 4mg /ml Dexamethasone, 79439 (1-2 muscles), 20561 (3+ muscles)- Dry Needling, Patient/Family education, Taping, Joint mobilization, Joint manipulation, Spinal manipulation, Spinal mobilization, Cryotherapy, and Moist heat  PLAN FOR NEXT SESSION: L shoulder PROM, AAROM, light strengthening for postural,  Assess ionto  Jerame Hedding,ANGIE, PTA 03/11/2024, 8:54 AM

## 2024-03-12 ENCOUNTER — Encounter: Payer: Self-pay | Admitting: Family Medicine

## 2024-03-13 ENCOUNTER — Ambulatory Visit: Admitting: Physical Therapy

## 2024-03-13 DIAGNOSIS — M25812 Other specified joint disorders, left shoulder: Secondary | ICD-10-CM

## 2024-03-13 DIAGNOSIS — M25612 Stiffness of left shoulder, not elsewhere classified: Secondary | ICD-10-CM

## 2024-03-13 DIAGNOSIS — M25512 Pain in left shoulder: Secondary | ICD-10-CM | POA: Diagnosis not present

## 2024-03-13 DIAGNOSIS — G8929 Other chronic pain: Secondary | ICD-10-CM

## 2024-03-13 NOTE — Therapy (Signed)
 OUTPATIENT PHYSICAL THERAPY SHOULDER TREATMENT   Patient Name: Denver Harder MRN: [id number] DOB:10-May-1961, 63 y.o., female Today's Date: 03/13/2024  END OF SESSION:  PT End of Session - 03/13/24 1057     Visit Number 4    Date for PT Re-Evaluation 05/14/24    Authorization Type HTA    PT Start Time 1100    PT Stop Time 1140    PT Time Calculation (min) 40 min           Past Medical History:  Diagnosis Date   Asthma    Chiari malformation type II (HCC)    diagnosed when in high school   Chiari malformation type II (HCC)    Colon polyp 2006   (TUBULAR ADENOMA)--COLONOSCOPY-DR. HAYES   Constipation    Fluid retention    H/O seasonal allergies    Medial meniscus tear    left   Over weight    Ulcerative colitis (HCC)    Past Surgical History:  Procedure Laterality Date   ABDOMINAL HYSTERECTOMY  1999   complete   CESAREAN SECTION  1995 and 1998   HERNIA REPAIR  2004   umbilical   Patient Active Problem List   Diagnosis Date Noted   Preventative health care 11/29/2020   Depression 08/12/2018   Insulin  resistance 07/25/2018   Vitamin D  deficiency 09/25/2017   Prediabetes 09/25/2017   Shortness of breath on exertion 09/10/2017   Other fatigue 09/10/2017   Hyperglycemia 09/10/2017   Chronic constipation 09/10/2017   Class 3 severe obesity with serious comorbidity and body mass index (BMI) of 45.0 to 49.9 in adult 09/10/2017   Severe obesity (BMI >= 40) (HCC) 06/12/2014   Lung nodule 11/19/2012   MORBID OBESITY 03/28/2010   POSTMENOPAUSAL STATUS 03/28/2010   Asthma, episodic with mucus plugs 06/14/2009   HYPOKALEMIA 02/21/2008   Essential hypertension 09/06/2007    PCP: Jamee Cyndee Shanks  REFERRING PROVIDER: Kenney Roys  REFERRING DIAG:  [phone] (ICD-10-CM) - Left arm pain      THERAPY DIAG:  Chronic left shoulder pain  Impingement of left shoulder  Stiffness of left shoulder, not elsewhere classified  Rationale for Evaluation  and Treatment: Rehabilitation  ONSET DATE: 01/31/24  SUBJECTIVE:                                                                                                                                                                                      SUBJECTIVE STATEMENT: patch made it not throb as much. Called PCP and getting injection next Tuesday   PERTINENT HISTORY: See above for PMH  PAIN:  Are you having pain? Left shld 8/10 with use, 1-2 at  rest  PRECAUTIONS: None  RED FLAGS: None   WEIGHT BEARING RESTRICTIONS: No  FALLS:  Has patient fallen in last 6 months? No  LIVING ENVIRONMENT: Lives with: lives with their spouse Lives in: House/apartment  OCCUPATION: Retired  PLOF: Independent and Independent with basic ADLs  PATIENT GOALS:To have no pain and use my arm again  NEXT MD VISIT:   OBJECTIVE:  Note: Objective measures were completed at Evaluation unless otherwise noted.  DIAGNOSTIC FINDINGS:  There is no evidence of fracture or dislocation. Glenohumeral joint space is preserved. Minimal acromioclavicular spurring. Small subacromial spur. No evidence of erosion or focal bone abnormality. Soft tissues are unremarkable.   IMPRESSION: Minimal acromioclavicular spurring. Small subacromial spur.  PATIENT SURVEYS:  QuickDash   COGNITION: Overall cognitive status: Within functional limits for tasks assessed     SENSATION: [facility]   UPPER EXTREMITY ROM:   Active ROM Right eval Left Eval SEATED Left 03/11/24 standing  Shoulder flexion  120 p! 132 p!  Shoulder extension     Shoulder abduction  90 p! 95p!  Shoulder adduction     Shoulder internal rotation  20 p!  L3   Shoulder external rotation   40!  Base of skull Same p!  Elbow flexion     Elbow extension     Wrist flexion     Wrist extension     Wrist ulnar deviation     Wrist radial deviation     Wrist pronation     Wrist supination     (Blank rows = not tested)  UPPER EXTREMITY MMT: 4+/5 all L  shoulder mm    SHOULDER SPECIAL TESTS: Impingement tests: Neer impingement test: positive , Hawkins/Kennedy impingement test: positive , and Painful arc test: positive   Rotator cuff assessment: Empty can test: positive   JOINT MOBILITY TESTING:  Close to full range with ER/IR, very guarded and has pain with abduction and flexion PROM   PALPATION:  No TTP but tightness around shoulder joint                                                                                                                             TREATMENT DATE:    03/13/24 UBE L 2 2 min each way Rolling ball up wall 10 x Ball vs wall 5 x CW and 5 x CCW Cable pulley IR 5# 2 sets 10 Left Yellow tband ER 2 sets 10 Lat pull 20 # 5 x Supine 3# cane ex chest press and flex AA ER/ER supine 2# ER/IR 2 # supine flexion PROM LEFT UE- ER very painlimited Ionto left shld 1.2 cc dex 4 hour leave on patch 80mA #2    03/11/24 UBE L 2 2 min each way Checked ROM and goals PROM and jt mobs Left shld supine SL 2# ER and abd 10 x the 3# 10 x Supine chest press and flexion 2# 2 sets 10 2# empty can and PNF 2 sets  10 Ionto left shld 1.2 cc dex 4 hour leave on patch 80mA    02/26/24 UBE L1 x2 mins each way Finger ladder x5 Red band rows and ext 2x10 Wall slides AAROM with dowel all directions 2x10 Scapular retraction 2x10  PROM with end range holds, joint mobilizations posterior and inferior grade 3-4 IR/ER red 2x10 IR with towel stretch x10 5s holds   02/20/24 EVAL    PATIENT EDUCATION: Education details: POC and HEP  Person educated: Patient Education method: Explanation Education comprehension: verbalized understanding and returned demonstration  HOME EXERCISE PROGRAM: Access Code: [email or web address] URL: [email or web address] Date: 02/20/2024 Prepared by: Almetta Fam  Exercises - Standing Shoulder Extension with Dowel  - 1 x daily - 7 x weekly - 2 sets - 10 reps - 3 hold - Standing Bilateral  Shoulder Internal Rotation AAROM with Dowel  - 1 x daily - 7 x weekly - 2 sets - 10 reps - 3 hold - Standing Shoulder Abduction AAROM with Dowel  - 1 x daily - 7 x weekly - 2 sets - 10 reps - 3 hold - Standing Shoulder Flexion AAROM with Dowel  - 1 x daily - 7 x weekly - 2 sets - 10 reps - 3 hold - Standing Shoulder External Rotation AAROM with Dowel  - 1 x daily - 7 x weekly - 2 sets - 10 reps - 3 hold  ASSESSMENT:  CLINICAL IMPRESSION:progressed ex for ROM and strength. Lat pull down was very painful. ER is limited by pain tried multi ways to get increased ER. Pt needs cuing to relax with ex and allow increased ROM. OBJECTIVE IMPAIRMENTS: decreased ROM, decreased strength, impaired UE functional use, and pain.   ACTIVITY LIMITATIONS: carrying, lifting, reach over head, and hygiene/grooming  PARTICIPATION LIMITATIONS: cleaning, laundry, and yard work  PERSONAL FACTORS: Time since onset of injury/illness/exacerbation are also affecting patient's functional outcome.   REHAB POTENTIAL: Good  CLINICAL DECISION MAKING: Stable/uncomplicated  EVALUATION COMPLEXITY: Low  GOALS: Goals reviewed with patient? Yes  SHORT TERM GOALS: Target date: 04/02/24  Patient will be independent with initial HEP.  Baseline:  Goal status: 03/11/24 MET  2.  Patient will be able to tolerate PROM into full ranges for all directions   Baseline: very guarded with flexion and abd Goal status: progressing 03/11/24   LONG TERM GOALS: Target date: 05/14/24  Patient will be independent with advanced/ongoing HEP to improve outcomes and carryover.  Baseline:  Goal status: INITIAL  2.  Patient will report 75% improvement in L shoulder pain to improve QOL.  Baseline: 10/10 with movement  Goal status: INITIAL  3.  Patient to improve L shoulder AROM to [facility] without pain provocation to allow for increased ease of ADLs.  Baseline: see chart Goal status: INITIAL  4.  Patient will be able to wash and do her hair and  hook her bra without pain and difficulty.  Baseline:  Goal status: INITIAL  5.  Patient will report 10 points improvement on QuickDash to demonstrate improved functional ability.  Baseline: not done at eval Goal status: INITIAL   PLAN:  PT FREQUENCY: 2x/week  PT DURATION: 12 weeks  PLANNED INTERVENTIONS: 97110-Therapeutic exercises, 97530- Therapeutic activity, 97112- Neuromuscular re-education, 97535- Self Care, 02859- Manual therapy, G0283- Electrical stimulation (unattended), [phone]- Ionotophoresis 4mg /ml Dexamethasone, 79439 (1-2 muscles), 20561 (3+ muscles)- Dry Needling, Patient/Family education, Taping, Joint mobilization, Joint manipulation, Spinal manipulation, Spinal mobilization, Cryotherapy, and Moist heat  PLAN FOR NEXT SESSION: L shoulder PROM, AAROM, light strengthening for postural,  Assess injection  Felicia Both,ANGIE, PTA 03/13/2024, 10:58 AM

## 2024-03-17 ENCOUNTER — Encounter: Admitting: Physical Therapy

## 2024-03-18 ENCOUNTER — Ambulatory Visit

## 2024-03-18 ENCOUNTER — Encounter: Payer: Self-pay | Admitting: Family Medicine

## 2024-03-18 ENCOUNTER — Ambulatory Visit: Admitting: Family Medicine

## 2024-03-18 VITALS — BP 124/88 | HR 80 | Temp 98.2°F | Resp 18 | Ht 64.0 in | Wt 240.0 lb

## 2024-03-18 DIAGNOSIS — I10 Essential (primary) hypertension: Secondary | ICD-10-CM

## 2024-03-18 DIAGNOSIS — Z6841 Body Mass Index (BMI) 40.0 and over, adult: Secondary | ICD-10-CM | POA: Diagnosis not present

## 2024-03-18 DIAGNOSIS — M25512 Pain in left shoulder: Secondary | ICD-10-CM | POA: Diagnosis not present

## 2024-03-18 DIAGNOSIS — G8929 Other chronic pain: Secondary | ICD-10-CM | POA: Diagnosis not present

## 2024-03-18 MED ORDER — METHYLPREDNISOLONE ACETATE 40 MG/ML IJ SUSP
20.0000 mg | Freq: Once | INTRAMUSCULAR | Status: AC
Start: 2024-03-18 — End: 2024-03-18
  Administered 2024-03-18: 20 mg

## 2024-03-18 NOTE — Progress Notes (Signed)
 Subjective:    Patient ID: Audrey Duncan, female    DOB: 04-16-1961, 63 y.o.   MRN: [id number]  Chief Complaint  Patient presents with   Shoulder Pain    Left shoulder pain, pt has been going to PT has been unable to do the exercises     HPI Patient is in today for joint injection.   Discussed the use of AI scribe software for clinical note transcription with the patient, who gave verbal consent to proceed.  History of Present Illness Audrey Duncan is a 63 year old female who presents with persistent shoulder pain.  She has been experiencing ongoing shoulder pain that has not improved despite physical therapy. The pain is primarily located in the shoulder, and sometimes extends to the shoulder blade and down the arm, as described by the patient. It is exacerbated by physical therapy exercises and daily activities such as washing her hair, closing her car door, and curling her hair.  An x-ray was previously performed, revealing a tiny bone spur, but no other significant findings. Physical therapy suggested the possibility of a frozen shoulder or impingement, but her condition has not improved. She has been attending physical therapy twice a week for three weeks, with a one-week break due to family commitments.  She has not received a cortisone shot, which was suggested by her cousin and physical therapists as a potential aid for performing exercises. She has never had a cortisone shot before and is unsure about its administration.  Family history includes her mother having undergone shoulder surgery for arthritis, which resulted in a shoulder replacement.    Past Medical History:  Diagnosis Date   Asthma    Chiari malformation type II (HCC)    diagnosed when in high school   Chiari malformation type II (HCC)    Colon polyp 2006   (TUBULAR ADENOMA)--COLONOSCOPY-DR. HAYES   Constipation    Fluid retention    H/O seasonal allergies    Medial  meniscus tear    left   Over weight    Ulcerative colitis (HCC)     Past Surgical History:  Procedure Laterality Date   ABDOMINAL HYSTERECTOMY  1999   complete   CESAREAN SECTION  1995 and 1998   HERNIA REPAIR  2004   umbilical    Family History  Problem Relation Age of Onset   Heart disease Mother 22       2 MIs    Stroke Mother 48   Hypertension Mother    Cancer Mother        lung, lymphoma   Lung cancer Mother        was a smoker   Kidney disease Mother    Obesity Mother    COPD Father        smoker   Cancer Father        colon, lung prostate   Alcohol abuse Father    Hypertension Father    Obesity Father    Alzheimer's disease Father    Asthma Sister    Alcohol abuse Brother    Diabetes Paternal Uncle    Breast cancer Neg Hx     Social History   Socioeconomic History   Marital status: Married    Spouse name: Roslynn   Number of children: 2   Years of education: Not on file   Highest education level: Not on file  Occupational History    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  Tobacco Use  Smoking status: Never   Smokeless tobacco: Never  Vaping Use   Vaping status: Never Used  Substance and Sexual Activity   Alcohol use: Yes    Comment: 1 glass of wine 1-2 x a month   Drug use: No   Sexual activity: Yes    Partners: Male    Birth control/protection: Surgical  Other Topics Concern   Not on file  Social History Narrative   Exercise --- bikes ,, walking    Social Drivers of Health   Financial Resource Strain: Not on file  Food Insecurity: Not on file  Transportation Needs: No Transportation Needs (10/27/2023)   Received from [employer]    In the past 12 months, has lack of reliable transportation kept you from medical appointments, meetings, work or from getting things needed for daily living? : No  Physical Activity: Not on file  Stress: Not on file  Social Connections: Not on file  Intimate Partner Violence: Not on file    Outpatient  Medications Prior to Visit  Medication Sig Dispense Refill   albuterol  (VENTOLIN  HFA) 108 (90 Base) MCG/ACT inhaler Inhale 1-2 puffs into the lungs every 6 (six) hours as needed for wheezing or shortness of breath. 18 g 0   buPROPion  (WELLBUTRIN  XL) 300 MG 24 hr tablet TAKE 1 TABLET BY MOUTH EVERY DAY 90 tablet 1   cyclobenzaprine  (FLEXERIL ) 10 MG tablet Take 1 tablet (10 mg total) by mouth at bedtime as needed for muscle spasms. 20 tablet 0   hydrochlorothiazide  (HYDRODIURIL ) 25 MG tablet Take 1 tablet (25 mg total) by mouth daily. 90 tablet 1   ibuprofen (ADVIL) 200 MG tablet Take 200 mg by mouth every 6 (six) hours as needed.     KLOR-CON  M20 20 MEQ tablet TAKE 1 TABLET BY MOUTH EVERY DAY 90 tablet 1   loratadine (CLARITIN) 10 MG tablet Take 10 mg by mouth daily.     Magnesium 250 MG TABS Take 1 tablet by mouth daily.     meloxicam  (MOBIC ) 15 MG tablet Take 0.5-1 tablets (7.5-15 mg total) by mouth daily as needed for pain. 90 tablet 3   polyethylene glycol powder (GLYCOLAX /MIRALAX ) 17 GM/SCOOP powder Take 1 Container by mouth once.     polyethylene glycol powder (GLYCOLAX /MIRALAX ) powder Take 17 g by mouth daily. (Patient taking differently: Take 17 g by mouth daily as needed.) 3350 g 0   Probiotic Product (ALIGN PO) Take 1 capsule by mouth daily.     silver  sulfADIAZINE  (SILVADENE ) 1 % cream Apply 1 Application topically daily. 100 g 0   tirzepatide  (ZEPBOUND ) 5 MG/0.5ML injection vial INJECT 0.5 ML (5 MG) UNDER THE SKIN ONCE WEEKLY (0.5ML= 50 UNITS) 2 mL 3   vitamin B-12 (CYANOCOBALAMIN ) 250 MCG tablet Take 250 mcg by mouth daily.     Vitamin D , Ergocalciferol , (DRISDOL ) 1.25 MG (50000 UNIT) CAPS capsule TAKE 1 CAPSULE (50,000 UNITS TOTAL) BY MOUTH EVERY 7 (SEVEN) DAYS 12 capsule 1   Zinc 100 MG TABS Take by mouth.     No facility-administered medications prior to visit.    No Known Allergies  Review of Systems  Constitutional:  Negative for fever and malaise/fatigue.  HENT:  Negative  for congestion.   Eyes:  Negative for blurred vision.  Respiratory:  Negative for cough and shortness of breath.   Cardiovascular:  Negative for chest pain, palpitations and leg swelling.  Gastrointestinal:  Negative for vomiting.  Musculoskeletal:  Positive for joint pain. Negative for back  pain.  Skin:  Negative for rash.  Neurological:  Negative for loss of consciousness and headaches.       Objective:    Physical Exam Vitals and nursing note reviewed.  Musculoskeletal:        General: Tenderness present.     Left shoulder: Tenderness present. Decreased range of motion.       Arms:     Comments: L shoulder ---- AC joint Depomedrol 1 ml with lidocaine injected into ac joint after cleaning with alcohol     BP 124/88 (BP Location: Right Arm, Patient Position: Sitting)   Pulse 80   Temp 98.2 F (36.8 C) (Oral)   Resp 18   Ht 5' 4 (1.626 m)   Wt 240 lb (108.9 kg)   SpO2 98%   BMI 41.20 kg/m  Wt Readings from Last 3 Encounters:  03/18/24 240 lb (108.9 kg)  11/06/23 251 lb 9.6 oz (114.1 kg)  05/15/23 267 lb 3.2 oz (121.2 kg)    Diabetic Foot Exam - Simple   No data filed    Lab Results  Component Value Date   WBC 5.5 05/15/2023   HGB 13.9 05/15/2023   HCT 43.2 05/15/2023   PLT 291.0 05/15/2023   GLUCOSE 96 05/15/2023   CHOL 162 05/15/2023   TRIG 72.0 05/15/2023   HDL 56.40 05/15/2023   LDLCALC 91 05/15/2023   ALT 26 05/15/2023   AST 23 05/15/2023   NA 143 05/15/2023   K 4.6 05/15/2023   CL 102 05/15/2023   CREATININE 0.90 05/15/2023   BUN 17 05/15/2023   CO2 34 (H) 05/15/2023   TSH 1.37 05/15/2023   HGBA1C 5.3 09/11/2019    Lab Results  Component Value Date   TSH 1.37 05/15/2023   Lab Results  Component Value Date   WBC 5.5 05/15/2023   HGB 13.9 05/15/2023   HCT 43.2 05/15/2023   MCV 95.8 05/15/2023   PLT 291.0 05/15/2023   Lab Results  Component Value Date   NA 143 05/15/2023   K 4.6 05/15/2023   CO2 34 (H) 05/15/2023   GLUCOSE 96  05/15/2023   BUN 17 05/15/2023   CREATININE 0.90 05/15/2023   BILITOT 0.8 05/15/2023   ALKPHOS 92 05/15/2023   AST 23 05/15/2023   ALT 26 05/15/2023   PROT 6.6 05/15/2023   ALBUMIN 4.1 05/15/2023   CALCIUM 9.7 05/15/2023   ANIONGAP 6 05/23/2021   GFR 67.63 05/15/2023   Lab Results  Component Value Date   CHOL 162 05/15/2023   Lab Results  Component Value Date   HDL 56.40 05/15/2023   Lab Results  Component Value Date   LDLCALC 91 05/15/2023   Lab Results  Component Value Date   TRIG 72.0 05/15/2023   Lab Results  Component Value Date   CHOLHDL 3 05/15/2023   Lab Results  Component Value Date   HGBA1C 5.3 09/11/2019       Assessment & Plan:  Chronic left shoulder pain -     methylPREDNISolone  Acetate  Essential hypertension -     CBC with Differential/Platelet; Future -     Comprehensive metabolic panel with GFR; Future -     Lipid panel; Future -     TSH; Future  Morbid obesity (HCC) Assessment & Plan: Check labs   Orders: -     CBC with Differential/Platelet; Future -     Comprehensive metabolic panel with GFR; Future -     Lipid panel; Future -  TSH; Future -     Hemoglobin A1c; Future -     Insulin , random; Future  Assessment and Plan Assessment & Plan Left shoulder pain   Chronic left shoulder pain with limited range of motion is likely due to impingement syndrome or frozen shoulder. X-ray shows a tiny bone spur, but no significant findings to explain the pain's severity. Pain is localized to the deltoid muscle and acromioclavicular joint area, worsened by physical therapy. She struggles with daily activities like washing her hair and closing the car door. Consider a cortisone injection to the left shoulder to alleviate pain and improve function. Continue physical therapy sessions. Consider MRI if symptoms do not improve with current treatment.    Clare Casto R Lowne Chase, DO

## 2024-03-18 NOTE — Assessment & Plan Note (Signed)
 Check labs

## 2024-03-18 NOTE — Assessment & Plan Note (Signed)
 Injected shoulder with no complications  Consider mri if no relief
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
## 2024-03-20 ENCOUNTER — Ambulatory Visit

## 2024-03-20 ENCOUNTER — Encounter: Payer: Self-pay | Admitting: Family Medicine

## 2024-03-20 DIAGNOSIS — G8929 Other chronic pain: Secondary | ICD-10-CM

## 2024-03-20 DIAGNOSIS — M25812 Other specified joint disorders, left shoulder: Secondary | ICD-10-CM

## 2024-03-20 DIAGNOSIS — M25612 Stiffness of left shoulder, not elsewhere classified: Secondary | ICD-10-CM

## 2024-03-20 DIAGNOSIS — M25512 Pain in left shoulder: Secondary | ICD-10-CM | POA: Diagnosis not present

## 2024-03-21 ENCOUNTER — Other Ambulatory Visit: Payer: Self-pay | Admitting: Family Medicine

## 2024-03-21 DIAGNOSIS — G8929 Other chronic pain: Secondary | ICD-10-CM

## 2024-03-24 ENCOUNTER — Other Ambulatory Visit (INDEPENDENT_AMBULATORY_CARE_PROVIDER_SITE_OTHER)

## 2024-03-24 ENCOUNTER — Ambulatory Visit

## 2024-03-24 DIAGNOSIS — I10 Essential (primary) hypertension: Secondary | ICD-10-CM | POA: Diagnosis not present

## 2024-03-24 LAB — CBC WITH DIFFERENTIAL/PLATELET
Basophils Absolute: 0.1 K/uL (ref 0.0–0.1)
Basophils Relative: 0.9 % (ref 0.0–3.0)
Eosinophils Absolute: 0.1 K/uL (ref 0.0–0.7)
Eosinophils Relative: 2.1 % (ref 0.0–5.0)
HCT: 41 % (ref 36.0–46.0)
Hemoglobin: 13.7 g/dL (ref 12.0–15.0)
Lymphocytes Relative: 25.1 % (ref 12.0–46.0)
Lymphs Abs: 1.6 K/uL (ref 0.7–4.0)
MCHC: 33.3 g/dL (ref 30.0–36.0)
MCV: 94.5 fl (ref 78.0–100.0)
Monocytes Absolute: 0.4 K/uL (ref 0.1–1.0)
Monocytes Relative: 6.9 % (ref 3.0–12.0)
Neutro Abs: 4.1 K/uL (ref 1.4–7.7)
Neutrophils Relative %: 65 % (ref 43.0–77.0)
Platelets: 285 K/uL (ref 150.0–400.0)
RBC: 4.34 Mil/uL (ref 3.87–5.11)
RDW: 12.9 % (ref 11.5–15.5)
WBC: 6.3 K/uL (ref 4.0–10.5)

## 2024-03-24 LAB — LIPID PANEL
Cholesterol: 133 mg/dL (ref 0–200)
HDL: 55.6 mg/dL (ref >39.00)
LDL Cholesterol: 65 mg/dL (ref 0–99)
NonHDL: 76.95
Total CHOL/HDL Ratio: 2
Triglycerides: 62 mg/dL (ref 0.0–149.0)
VLDL: 12.4 mg/dL (ref 0.0–40.0)

## 2024-03-24 LAB — COMPREHENSIVE METABOLIC PANEL WITH GFR
ALT: 20 U/L (ref 0–35)
AST: 23 U/L (ref 0–37)
Albumin: 4.3 g/dL (ref 3.5–5.2)
Alkaline Phosphatase: 81 U/L (ref 39–117)
BUN: 15 mg/dL (ref 6–23)
CO2: 29 meq/L (ref 19–32)
Calcium: 9.5 mg/dL (ref 8.4–10.5)
Chloride: 104 meq/L (ref 96–112)
Creatinine, Ser: 0.79 mg/dL (ref 0.40–1.20)
GFR: 78.61 mL/min (ref >60.00)
Glucose, Bld: 85 mg/dL (ref 70–99)
Potassium: 4 meq/L (ref 3.5–5.1)
Sodium: 142 meq/L (ref 135–145)
Total Bilirubin: 1 mg/dL (ref 0.2–1.2)
Total Protein: 6.5 g/dL (ref 6.0–8.3)

## 2024-03-24 LAB — TSH: TSH: 2.05 u[IU]/mL (ref 0.35–5.50)

## 2024-03-24 LAB — HEMOGLOBIN A1C: Hgb A1c MFr Bld: 5.3 % (ref 4.6–6.5)

## 2024-03-25 ENCOUNTER — Inpatient Hospital Stay
Admission: RE | Admit: 2024-03-25 | Discharge: 2024-03-25 | Discharge status: Home / Self Care | Source: Ambulatory Visit | Attending: Family Medicine | Admitting: Family Medicine

## 2024-03-25 ENCOUNTER — Encounter: Payer: Self-pay | Admitting: Family Medicine

## 2024-03-25 ENCOUNTER — Ambulatory Visit: Admitting: Occupational Therapy

## 2024-03-25 DIAGNOSIS — G8929 Other chronic pain: Secondary | ICD-10-CM

## 2024-03-25 DIAGNOSIS — M19012 Primary osteoarthritis, left shoulder: Secondary | ICD-10-CM | POA: Diagnosis not present

## 2024-03-25 LAB — INSULIN, RANDOM: Insulin: 14 u[IU]/mL

## 2024-03-25 NOTE — Telephone Encounter (Signed)
 MRI scheduled.

## 2024-03-28 ENCOUNTER — Ambulatory Visit: Payer: Self-pay | Admitting: Family Medicine

## 2024-03-31 ENCOUNTER — Other Ambulatory Visit: Payer: Self-pay | Admitting: Family Medicine

## 2024-03-31 ENCOUNTER — Encounter: Payer: Self-pay | Admitting: Family Medicine

## 2024-03-31 ENCOUNTER — Ambulatory Visit: Payer: Self-pay | Admitting: Family Medicine

## 2024-03-31 DIAGNOSIS — M12812 Other specific arthropathies, not elsewhere classified, left shoulder: Secondary | ICD-10-CM

## 2024-04-04 ENCOUNTER — Telehealth: Payer: Self-pay

## 2024-04-04 NOTE — Telephone Encounter (Signed)
 Copied from CRM #[id number]. Topic: Referral - Status >> Apr 02, 2024  2:34 PM Shereese L wrote: Reason for CRM: patient is calling for referral status and is requesting a call back   Secured vm and a message can be left >> Apr 04, 2024  8:33 AM Leotis ORN wrote: Referral: [id number]  ortho referral for shoulder  >> Apr 02, 2024  2:44 PM CMA Lila C wrote: Error CRM sent, Pt is calling regarding referral information for?? Need further information from agent.

## 2024-04-04 NOTE — Telephone Encounter (Signed)
 CRM # [id number] Owner: Abron Leotis FALCON Status: Resolved Open  Priority: Routine Created on: 04/02/2024 02:43 PM By: Charmane Protzman, CMA   Primary Information  Source  Employee   Subject  Employee   Topic  E2C2 Error Reporting - Messaging/Routing Errors    Communication  This CRM was created to begin the process of a messaging/routing error investigation. Please see the corresponding attachments, forms, and notes for details.     Notes  Abron Leotis FALCON   04/04/2024  8:35 AM  Type: General  Thank you for submitting this issue for investigation. After a thorough review, we have determined that an error was made by an [employer] team member. We have addressed the matter directly with the agent involved. We appreciate you bringing this to our attention.   Error/Investigation Details: Call ID  [id number] patient was calling in regards to ortho referral for shoulder, specialist have been advised to make sure documentation is detailed       Rhia Blatchford, CMA   04/02/2024  2:43 PM  Referral status for??? Not enough information by agent.     Smayan Hackbart, CMA   04/02/2024  2:43 PM  CRM # [id number] Owner: None Status: Unresolved Open  Priority: Routine Created on: 04/02/2024 02:34 PM By: Ezzard Charolett HERO    Primary Information   Source  Audrey Duncan (Patient)   Subject  Audrey Duncan (Patient)   Topic  Referral - Status    Communication  Reason for CRM: patient is calling for referral status and is requesting a call back           Secured vm and a message can be left       Patient Information   Patient Name Gender DOB SSN  [name], [name] Female February 09, 1961 [id number]    Contacts   Contact Date/Time Type Contact Phone/Fax  04/02/2024 02:32 PM EDT Phone (Incoming) Audrey Delon [place] (Self) [phone]

## 2024-04-07 ENCOUNTER — Other Ambulatory Visit: Payer: Self-pay | Admitting: Family Medicine

## 2024-04-08 DIAGNOSIS — S46012A Strain of muscle(s) and tendon(s) of the rotator cuff of left shoulder, initial encounter: Secondary | ICD-10-CM | POA: Diagnosis not present

## 2024-05-12 ENCOUNTER — Telehealth: Payer: Self-pay | Admitting: *Deleted

## 2024-05-12 ENCOUNTER — Ambulatory Visit: Admitting: *Deleted

## 2024-05-12 VITALS — Ht 64.0 in | Wt 223.0 lb

## 2024-05-12 DIAGNOSIS — Z1231 Encounter for screening mammogram for malignant neoplasm of breast: Secondary | ICD-10-CM

## 2024-05-12 DIAGNOSIS — Z0000 Encounter for general adult medical examination without abnormal findings: Secondary | ICD-10-CM

## 2024-05-12 NOTE — Patient Instructions (Addendum)
 Audrey Duncan,  Thank you for taking the time for your Medicare Wellness Visit. I appreciate your continued commitment to your health goals. Please review the care plan we discussed, and feel free to reach out if I can assist you further.  Please note that Annual Wellness Visits do not include a physical exam. Some assessments may be limited, especially if the visit was conducted virtually. If needed, we may recommend an in-person follow-up with your provider.  Goals: To complete shoulder surgery in December  To take a trip with spouse when he retires  To clean closets and organize   Ongoing Care Seeing your primary care provider every 3 to 6 months helps us  monitor your health and provide consistent, personalized care.   Dr Audrey Duncan:  05/27/24 8:40am, (can get pneumonia vaccine) Annual Wellness Visit:  05/13/25 3pm, telephone  Recommended Screenings:  Health Maintenance  Topic Date Due   Medicare Annual Wellness Visit  Never done   Pneumococcal Vaccine for age over 16 (1 of 2 - PCV) Never done   DEXA scan (bone density measurement)  11/09/2024*   COVID-19 Vaccine (3 - 2025-26 season) 05/12/2025*   HIV Screening  05/12/2025*   Zoster (Shingles) Vaccine (1 of 2) 05/12/2025*   Breast Cancer Screening  08/02/2025   Colon Cancer Screening  04/21/2026   DTaP/Tdap/Td vaccine (3 - Td or Tdap) 11/30/2030   Flu Shot  Completed   Hepatitis C Screening  Completed   Hepatitis B Vaccine  Aged Out   Meningitis B Vaccine  Aged Out  *Topic was postponed. The date shown is not the original due date.       05/12/2024    3:08 PM  Advanced Directives  Does Patient Have a Medical Advance Directive? Yes  Type of [name];Living will  Does patient want to make changes to medical advance directive? No - Patient declined  Copy of Healthcare Power of Attorney in Chart? No - copy requested   You may return a copy of your Advanced Directive(s) by either of the  following:  Bring a copy of your health care power of attorney and living will to the office to be added to your chart at your convenience. You can mail a copy to [name] [address] [name], [place] [address] or email to [email or web address]   Vision: Annual vision screenings are recommended for early detection of glaucoma, cataracts, and diabetic retinopathy. These exams can also reveal signs of chronic conditions such as diabetes and high blood pressure.  Dental: Annual dental screenings help detect early signs of oral cancer, gum disease, and other conditions linked to overall health, including heart disease and diabetes.  Please see the attached documents for additional preventive care recommendations.

## 2024-05-12 NOTE — Progress Notes (Signed)
 Subjective:   Audrey Duncan is a 63 y.o. female who presents for a Medicare Annual Wellness Visit.  Allergies (verified) Patient has no known allergies.   History: Past Medical History:  Diagnosis Date   Asthma    Chiari malformation type II (HCC)    diagnosed when in high school   Chiari malformation type II (HCC)    Colon polyp 2006   (TUBULAR ADENOMA)--COLONOSCOPY-DR. HAYES   Constipation    Fluid retention    H/O seasonal allergies    Medial meniscus tear    left   Over weight    Ulcerative colitis (HCC)    Past Surgical History:  Procedure Laterality Date   ABDOMINAL HYSTERECTOMY  1999   complete   CESAREAN SECTION  1995 and 1998   HERNIA REPAIR  2004   umbilical   Family History  Problem Relation Age of Onset   Heart disease Mother 50       2 MIs    Stroke Mother 27   Hypertension Mother    Cancer Mother        lung, lymphoma   Lung cancer Mother        was a smoker   Kidney disease Mother    Obesity Mother    COPD Father        smoker   Cancer Father        colon, lung prostate   Alcohol abuse Father    Hypertension Father    Obesity Father    Alzheimer's disease Father    Asthma Sister    Alcohol abuse Brother    Diabetes Paternal Uncle    Breast cancer Neg Hx    Social History   Occupational History    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  Tobacco Use   Smoking status: Never   Smokeless tobacco: Never  Vaping Use   Vaping status: Never Used  Substance and Sexual Activity   Alcohol use: Yes    Comment: 1 glass of wine 1-2 x a month   Drug use: No   Sexual activity: Yes    Partners: Male    Birth control/protection: Surgical   Tobacco Counseling Counseling given: Not Answered  SDOH Screenings   Food Insecurity: No Food Insecurity (05/12/2024)  Housing: Low Risk  (05/12/2024)  Transportation Needs: No Transportation Needs (05/12/2024)  Utilities: Not At Risk (05/12/2024)  Depression (PHQ2-9): Low Risk  (05/12/2024)  Physical Activity:  Sufficiently Active (05/12/2024)  Social Connections: Moderately Integrated (05/12/2024)  Stress: Stress Concern Present (05/12/2024)  Tobacco Use: Low Risk  (03/18/2024)  Health Literacy: Adequate Health Literacy (05/12/2024)   Depression Screen    05/12/2024    3:19 PM 03/18/2024    1:20 PM 12/13/2021    9:25 AM 11/29/2020    1:07 PM 11/03/2019    8:16 AM 09/10/2017    8:29 AM 06/11/2017    9:17 AM  PHQ 2/9 Scores  PHQ - 2 Score 0 0 0 0 0 3 0  PHQ- 9 Score 1 0    11   Exception Documentation      Medical reason      Goals Addressed             This Visit's Progress    To clean closets and organize       To complete shoulder surgery in December       to take a trip with spouse when he retires         Visit info /  Clinical Intake: Medicare Wellness Visit Type:: Initial Annual Wellness Visit Medicare Wellness Visit Mode:: Telephone If telephone:: video declined Interpreter Needed?: No Pre-visit prep was completed: yes AWV questionnaire completed by patient prior to visit?: no Living arrangements:: (!) lives alone Patient's Overall Health Status Rating: very good Typical amount of pain: some (awaiting rotator cuff surgery on 06/09/24) Does pain affect daily life?: (!) yes Are you currently prescribed opioids?: no  Dietary Habits and Nutritional Risks How many meals a day?: 2 Eats fruit and vegetables daily?: yes Most meals are obtained by: preparing own meals Diabetic:: no  Functional Status Activities of Daily Living (to include ambulation/medication): Independent Ambulation: Independent Medication Administration: Independent Home Management: Independent Manage your own finances?: yes Primary transportation is: driving Concerns about vision?: no *vision screening is required for WTM* Concerns about hearing?: no  Fall Screening Falls in the past year?: 0 Number of falls in past year: 0 Was there an injury with Fall?: 0 Fall Risk Category Calculator: 0 Patient Fall  Risk Level: Low Fall Risk  Fall Risk Patient at Risk for Falls Due to: No Fall Risks Fall risk Follow up: Education provided  Home and Transportation Safety: All rugs have non-skid backing?: yes All stairs or steps have railings?: yes Grab bars in the bathtub or shower?: (!) no Have non-skid surface in bathtub or shower?: (!) no Good home lighting?: yes Regular seat belt use?: yes Hospital stays in the last year:: no  Cognitive Assessment Difficulty concentrating, remembering, or making decisions? : no Will 6CIT or Mini Cog be Completed: yes What year is it?: 0 points What month is it?: 0 points Give patient an address phrase to remember (5 components): [address] [place]  About what time is it?: 0 points Count backwards from 20 to 1: 0 points Say the months of the year in reverse: 0 points Repeat the address phrase from earlier: 0 points 6 CIT Score: 0 points  Advance Directives (For Healthcare) Does Patient Have a Medical Advance Directive?: Yes Does patient want to make changes to medical advance directive?: No - Patient declined Type of Advance Directive: Healthcare Power of [place]; Living will Copy of Healthcare Power of Attorney in Chart?: No - copy requested Copy of Living Will in Chart?: No - copy requested  Reviewed/Updated  Reviewed/Updated: All        Objective:    Today's Vitals   05/12/24 1504  Weight: 223 lb (101.2 kg)  Height: 5' 4 (1.626 m)   Body mass index is 38.28 kg/m.  Current Medications (verified) Outpatient Encounter Medications as of 05/12/2024  Medication Sig   albuterol  (VENTOLIN  HFA) 108 (90 Base) MCG/ACT inhaler Inhale 1-2 puffs into the lungs every 6 (six) hours as needed for wheezing or shortness of breath.   buPROPion  (WELLBUTRIN  XL) 300 MG 24 hr tablet TAKE 1 TABLET BY MOUTH EVERY DAY   cyclobenzaprine  (FLEXERIL ) 10 MG tablet Take 1 tablet (10 mg total) by mouth at bedtime as needed for muscle spasms.    hydrochlorothiazide  (HYDRODIURIL ) 25 MG tablet Take 1 tablet (25 mg total) by mouth daily.   ibuprofen (ADVIL) 200 MG tablet Take 200 mg by mouth every 6 (six) hours as needed.   KLOR-CON  M20 20 MEQ tablet TAKE 1 TABLET BY MOUTH EVERY DAY   loratadine (CLARITIN) 10 MG tablet Take 10 mg by mouth daily.   Magnesium 250 MG TABS Take 1 tablet by mouth daily.   meloxicam  (MOBIC ) 15 MG tablet Take 0.5-1 tablets (7.5-15 mg total)  by mouth daily as needed for pain.   polyethylene glycol powder (GLYCOLAX /MIRALAX ) 17 GM/SCOOP powder Take 1 Container by mouth once.   polyethylene glycol powder (GLYCOLAX /MIRALAX ) powder Take 17 g by mouth daily. (Patient taking differently: Take 17 g by mouth daily as needed.)   Probiotic Product (ALIGN PO) Take 1 capsule by mouth daily.   silver  sulfADIAZINE  (SILVADENE ) 1 % cream Apply 1 Application topically daily.   vitamin B-12 (CYANOCOBALAMIN ) 250 MCG tablet Take 250 mcg by mouth daily.   Vitamin D , Ergocalciferol , (DRISDOL ) 1.25 MG (50000 UNIT) CAPS capsule TAKE 1 CAPSULE (50,000 UNITS TOTAL) BY MOUTH EVERY 7 (SEVEN) DAYS   ZEPBOUND  5 MG/0.5ML injection vial INJECT 0.5 ML (5 MG) UNDER THE SKIN ONCE WEEKLY (0.5ML= 50 UNITS)   Zinc 100 MG TABS Take by mouth.   No facility-administered encounter medications on file as of 05/12/2024.   Hearing/Vision screen Hearing Screening - Comments:: Denies hearing difficulties.  Vision Screening - Comments:: Up to date with routine eye exams with [facility] Immunizations and Health Maintenance Health Maintenance  Topic Date Due   Pneumococcal Vaccine: 50+ Years (1 of 2 - PCV) Never done   DEXA SCAN  11/09/2024 (Originally 01/03/2024)   COVID-19 Vaccine (3 - 2025-26 season) 05/12/2025 (Originally 03/10/2024)   HIV Screening  05/12/2025 (Originally 01/02/1974)   Zoster Vaccines- Shingrix (1 of 2) 05/12/2025 (Originally 01/02/2009)   Medicare Annual Wellness (AWV)  05/12/2025   Mammogram  08/02/2025   Colonoscopy   04/21/2026   DTaP/Tdap/Td (3 - Td or Tdap) 11/30/2030   Influenza Vaccine  Completed   Hepatitis C Screening  Completed   Hepatitis B Vaccines 19-59 Average Risk  Aged Out   Meningococcal B Vaccine  Aged Out        Assessment/Plan:  This is a routine wellness examination for [place].  Patient Care Team: Antonio Meth, Jamee SAUNDERS, DO as PCP - General (Family Medicine) Sharan, Kate LABOR, RN (Inactive) as [facility] Care Management [place], Tawni CROME, MD as Referring Physician (Dermatology) P.A., [facility] [place], [place], [place]  I have personally reviewed and noted the following in the patient's chart:   Medical and social history Use of alcohol, tobacco or illicit drugs  Current medications and supplements including opioid prescriptions. Functional ability and status Nutritional status Physical activity Advanced directives List of other physicians Hospitalizations, surgeries, and ER visits in previous 12 months Vitals Screenings to include cognitive, depression, and falls Referrals and appointments  Orders Placed This Encounter  Procedures   MM 3D SCREENING MAMMOGRAM BILATERAL BREAST    Standing Status:   Future    Expected Date:   08/04/2024    Expiration Date:   05/12/2025    Scheduling Instructions:     Pt states she is already scheduled    Reason for Exam (SYMPTOM  OR DIAGNOSIS REQUIRED):   breast cancer screening    Preferred imaging location?:   [facility]   In addition, I have reviewed and discussed with patient certain preventive protocols, quality metrics, and best practice recommendations. A written personalized care plan for preventive services as well as general preventive health recommendations were provided to patient.   Lolita Libra, CMA   05/12/2024   Return in 1 year (on 05/12/2025).  After Visit Summary: (MyChart) Due to this being a telephonic visit, the after visit summary with patients personalized  plan was offered to patient via MyChart   Nurse Notes: nothing significant to report

## 2024-05-12 NOTE — Telephone Encounter (Signed)
 Pt had AWV today and wanted you to know that she is scheduled for left Rotator cuff repair on 06/09/24.  She will be seeing you for a cpe on 05/27/24 and would like to get pneumonia vaccine at that visit (appointment notes were updated).

## 2024-05-15 ENCOUNTER — Encounter: Payer: 59 | Admitting: Family Medicine

## 2024-05-27 ENCOUNTER — Ambulatory Visit (INDEPENDENT_AMBULATORY_CARE_PROVIDER_SITE_OTHER): Admitting: Family Medicine

## 2024-05-27 ENCOUNTER — Encounter: Payer: Self-pay | Admitting: Family Medicine

## 2024-05-27 VITALS — BP 122/80 | HR 82 | Temp 98.0°F | Resp 18 | Ht 64.0 in | Wt 224.6 lb

## 2024-05-27 DIAGNOSIS — Z0000 Encounter for general adult medical examination without abnormal findings: Secondary | ICD-10-CM

## 2024-05-27 DIAGNOSIS — E2839 Other primary ovarian failure: Secondary | ICD-10-CM

## 2024-05-27 DIAGNOSIS — Z23 Encounter for immunization: Secondary | ICD-10-CM

## 2024-05-27 NOTE — Patient Instructions (Signed)
 Preventive Care 83 Years and Older, Female Preventive care refers to lifestyle choices and visits with your health care provider that can promote health and wellness. Preventive care visits are also called wellness exams. What can I expect for my preventive care visit? Counseling Your health care provider may ask you questions about your: Medical history, including: Past medical problems. Family medical history. Pregnancy and menstrual history. History of falls. Current health, including: Memory and ability to understand (cognition). Emotional well-being. Home life and relationship well-being. Sexual activity and sexual health. Lifestyle, including: Alcohol, nicotine or tobacco, and drug use. Access to firearms. Diet, exercise, and sleep habits. Work and work Astronomer. Sunscreen use. Safety issues such as seatbelt and bike helmet use. Physical exam Your health care provider will check your: Height and weight. These may be used to calculate your BMI (body mass index). BMI is a measurement that tells if you are at a healthy weight. Waist circumference. This measures the distance around your waistline. This measurement also tells if you are at a healthy weight and may help predict your risk of certain diseases, such as type 2 diabetes and high blood pressure. Heart rate and blood pressure. Body temperature. Skin for abnormal spots. What immunizations do I need?  Vaccines are usually given at various ages, according to a schedule. Your health care provider will recommend vaccines for you based on your age, medical history, and lifestyle or other factors, such as travel or where you work. What tests do I need? Screening Your health care provider may recommend screening tests for certain conditions. This may include: Lipid and cholesterol levels. Hepatitis C test. Hepatitis B test. HIV (human immunodeficiency virus) test. STI (sexually transmitted infection) testing, if you are at  risk. Lung cancer screening. Colorectal cancer screening. Diabetes screening. This is done by checking your blood sugar (glucose) after you have not eaten for a while (fasting). Mammogram. Talk with your health care provider about how often you should have regular mammograms. BRCA-related cancer screening. This may be done if you have a family history of breast, ovarian, tubal, or peritoneal cancers. Bone density scan. This is done to screen for osteoporosis. Talk with your health care provider about your test results, treatment options, and if necessary, the need for more tests. Follow these instructions at home: Eating and drinking  Eat a diet that includes fresh fruits and vegetables, whole grains, lean protein, and low-fat dairy products. Limit your intake of foods with high amounts of sugar, saturated fats, and salt. Take vitamin and mineral supplements as recommended by your health care provider. Do not drink alcohol if your health care provider tells you not to drink. If you drink alcohol: Limit how much you have to 0-1 drink a day. Know how much alcohol is in your drink. In the [place] one drink equals one 12 oz bottle of beer (355 mL), one 5 oz glass of wine (148 mL), or one 1 oz glass of hard liquor (44 mL). Lifestyle Brush your teeth every morning and night with fluoride toothpaste. Floss one time each day. Exercise for at least 30 minutes 5 or more days each week. Do not use any products that contain nicotine or tobacco. These products include cigarettes, chewing tobacco, and vaping devices, such as e-cigarettes. If you need help quitting, ask your health care provider. Do not use drugs. If you are sexually active, practice safe sex. Use a condom or other form of protection in order to prevent STIs. Take aspirin only as told by  your health care provider. Make sure that you understand how much to take and what form to take. Work with your health care provider to find out whether it  is safe and beneficial for you to take aspirin daily. Ask your health care provider if you need to take a cholesterol-lowering medicine (statin). Find healthy ways to manage stress, such as: Meditation, yoga, or listening to music. Journaling. Talking to a trusted person. Spending time with friends and family. Minimize exposure to UV radiation to reduce your risk of skin cancer. Safety Always wear your seat belt while driving or riding in a vehicle. Do not drive: If you have been drinking alcohol. Do not ride with someone who has been drinking. When you are tired or distracted. While texting. If you have been using any mind-altering substances or drugs. Wear a helmet and other protective equipment during sports activities. If you have firearms in your house, make sure you follow all gun safety procedures. What's next? Visit your health care provider once a year for an annual wellness visit. Ask your health care provider how often you should have your eyes and teeth checked. Stay up to date on all vaccines. This information is not intended to replace advice given to you by your health care provider. Make sure you discuss any questions you have with your health care provider. Document Revised: 12/22/2020 Document Reviewed: 12/22/2020 Elsevier Patient Education  2024 [employer].

## 2024-05-27 NOTE — Progress Notes (Signed)
 Subjective:    Patient ID: Audrey Duncan, female    DOB: January 28, 1961, 63 y.o.   MRN: [id number]  Chief Complaint  Patient presents with   welcome to medicare     HPI Patient is in today for cpe and welcome to medicare.   Discussed the use of AI scribe software for clinical note transcription with the patient, who gave verbal consent to proceed.  History of Present Illness Audrey Duncan is a 63 year old female who presents for a Welcome to Medicare visit.  She is scheduled for an arthroscopic shoulder surgery for a tear on December 1st, to be performed by Dr. Beverley Economy at [facility] in [place].  She maintains an active lifestyle by biking and walking daily, but experiences knee pain that prevents her from walking outside. She uses a walking pad indoors and exercises for about an hour each day, broken into segments. She feels exhausted after recent events, including her daughter's wedding.  No depression, hearing issues, or significant vision problems with her glasses. An eye exam is scheduled for next month. Her memory is good, and she recalls details from a recent conversation accurately.  She has a history of asthma, which has not been an issue for many years. She [facility] flu shot [facility] and is eligible for a pneumonia vaccine today. She has received the high-dose flu vaccine for older adults.  She experiences occasional constipation, which she suspects may be related to her medication, Zetpan. She inquires about taking magnesium, Dulcolax, or Senokot for relief. She uses Miralax  but sometimes forgets to take it.  She has a mammogram scheduled for January and is due for a colonoscopy in 2027. She is uncertain about the last time she had a bone density test.    Past Medical History:  Diagnosis Date   Asthma    Chiari malformation type II (HCC)    diagnosed when in high school   Chiari malformation type II (HCC)    Colon polyp 2006    (TUBULAR ADENOMA)--COLONOSCOPY-DR. HAYES   Constipation    Fluid retention    H/O seasonal allergies    Medial meniscus tear    left   Over weight    Ulcerative colitis (HCC)     Past Surgical History:  Procedure Laterality Date   ABDOMINAL HYSTERECTOMY  1999   complete   CESAREAN SECTION  1995 and 1998   HERNIA REPAIR  2004   umbilical    Family History  Problem Relation Age of Onset   Heart disease Mother 46       2 MIs    Stroke Mother 71   Hypertension Mother    Cancer Mother        lung, lymphoma   Lung cancer Mother        was a smoker   Kidney disease Mother    Obesity Mother    COPD Father        smoker   Cancer Father        colon, lung prostate   Alcohol abuse Father    Hypertension Father    Obesity Father    Alzheimer's disease Father    Asthma Sister    Alcohol abuse Brother    Diabetes Paternal Uncle    Breast cancer Neg Hx     Social History   Socioeconomic History   Marital status: Married    Spouse name: Roslynn   Number of children: 2   Years  of education: Not on file   Highest education level: Not on file  Occupational History    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    Comment: retired  Tobacco Use   Smoking status: Never   Smokeless tobacco: Never  Vaping Use   Vaping status: Never Used  Substance and Sexual Activity   Alcohol use: Yes    Comment: 1 glass of wine 1-2 x a month   Drug use: No   Sexual activity: Yes    Partners: Male    Birth control/protection: Surgical  Other Topics Concern   Not on file  Social History Narrative   Exercise --- bikes ,, walking    Social Drivers of Health   Financial Resource Strain: Not on file  Food Insecurity: No Food Insecurity (05/12/2024)   Hunger Vital Sign    Worried About Running Out of Food in the Last Year: Never true    Ran Out of Food in the Last Year: Never true  Transportation Needs: No Transportation Needs (05/12/2024)   PRAPARE - Administrator, Civil Service (Medical): No     Lack of Transportation (Non-Medical): No  Physical Activity: Sufficiently Active (05/12/2024)   Exercise Vital Sign    Days of Exercise per Week: 6 days    Minutes of Exercise per Session: 60 min  Stress: Stress Concern Present (05/12/2024)   [employer] of Occupational Health - Occupational Stress Questionnaire    Feeling of Stress: To some extent  Social Connections: Moderately Integrated (05/12/2024)   Social Connection and Isolation Panel    Frequency of Communication with Friends and Family: More than three times a week    Frequency of Social Gatherings with Friends and Family: Twice a week    Attends Religious Services: Never    Database Administrator or Organizations: Yes    Attends Engineer, Structural: More than 4 times per year    Marital Status: Married  Catering Manager Violence: Not At Risk (05/12/2024)   Humiliation, Afraid, Rape, and Kick questionnaire    Fear of Current or Ex-Partner: No    Emotionally Abused: No    Physically Abused: No    Sexually Abused: No    Outpatient Medications Prior to Visit  Medication Sig Dispense Refill   albuterol  (VENTOLIN  HFA) 108 (90 Base) MCG/ACT inhaler Inhale 1-2 puffs into the lungs every 6 (six) hours as needed for wheezing or shortness of breath. 18 g 0   buPROPion  (WELLBUTRIN  XL) 300 MG 24 hr tablet TAKE 1 TABLET BY MOUTH EVERY DAY 90 tablet 1   cyclobenzaprine  (FLEXERIL ) 10 MG tablet Take 1 tablet (10 mg total) by mouth at bedtime as needed for muscle spasms. 20 tablet 0   hydrochlorothiazide  (HYDRODIURIL ) 25 MG tablet Take 1 tablet (25 mg total) by mouth daily. 90 tablet 1   ibuprofen (ADVIL) 200 MG tablet Take 200 mg by mouth every 6 (six) hours as needed.     KLOR-CON  M20 20 MEQ tablet TAKE 1 TABLET BY MOUTH EVERY DAY 90 tablet 1   loratadine (CLARITIN) 10 MG tablet Take 10 mg by mouth daily.     Magnesium 250 MG TABS Take 1 tablet by mouth daily.     meloxicam  (MOBIC ) 15 MG tablet Take 0.5-1 tablets (7.5-15 mg  total) by mouth daily as needed for pain. 90 tablet 3   polyethylene glycol powder (GLYCOLAX /MIRALAX ) 17 GM/SCOOP powder Take 1 Container by mouth once.     polyethylene glycol powder (GLYCOLAX /MIRALAX ) powder Take 17 g  by mouth daily. (Patient taking differently: Take 17 g by mouth daily as needed.) 3350 g 0   Probiotic Product (ALIGN PO) Take 1 capsule by mouth daily.     silver  sulfADIAZINE  (SILVADENE ) 1 % cream Apply 1 Application topically daily. 100 g 0   vitamin B-12 (CYANOCOBALAMIN ) 250 MCG tablet Take 250 mcg by mouth daily.     Vitamin D , Ergocalciferol , (DRISDOL ) 1.25 MG (50000 UNIT) CAPS capsule TAKE 1 CAPSULE (50,000 UNITS TOTAL) BY MOUTH EVERY 7 (SEVEN) DAYS 12 capsule 1   ZEPBOUND  5 MG/0.5ML injection vial INJECT 0.5 ML (5 MG) UNDER THE SKIN ONCE WEEKLY (0.5ML= 50 UNITS) 2 mL 3   Zinc 100 MG TABS Take by mouth.     No facility-administered medications prior to visit.    No Known Allergies  Review of Systems  Constitutional:  Negative for chills, fever and malaise/fatigue.  HENT:  Negative for congestion and hearing loss.   Eyes:  Negative for blurred vision and discharge.  Respiratory:  Negative for cough, sputum production and shortness of breath.   Cardiovascular:  Negative for chest pain, palpitations and leg swelling.  Gastrointestinal:  Negative for abdominal pain, blood in stool, constipation, diarrhea, heartburn, nausea and vomiting.  Genitourinary:  Negative for dysuria, frequency, hematuria and urgency.  Musculoskeletal:  Positive for joint pain. Negative for back pain, falls and myalgias.  Skin:  Negative for rash.  Neurological:  Negative for dizziness, sensory change, loss of consciousness, weakness and headaches.  Endo/Heme/Allergies:  Negative for environmental allergies. Does not bruise/bleed easily.  Psychiatric/Behavioral:  Negative for depression and suicidal ideas. The patient is not nervous/anxious and does not have insomnia.        Objective:     Physical Exam Vitals and nursing note reviewed.  Constitutional:      General: She is not in acute distress.    Appearance: Normal appearance. She is well-developed.  HENT:     Head: Normocephalic and atraumatic.     Right Ear: Tympanic membrane, ear canal and external ear normal. There is no impacted cerumen.     Left Ear: Tympanic membrane, ear canal and external ear normal. There is no impacted cerumen.     Nose: Nose normal.     Mouth/Throat:     Mouth: Mucous membranes are moist.     Pharynx: Oropharynx is clear. No oropharyngeal exudate or posterior oropharyngeal erythema.  Eyes:     General: No scleral icterus.       Right eye: No discharge.        Left eye: No discharge.     Conjunctiva/sclera: Conjunctivae normal.     Pupils: Pupils are equal, round, and reactive to light.  Neck:     Thyroid : No thyromegaly or thyroid  tenderness.     Vascular: No JVD.  Cardiovascular:     Rate and Rhythm: Normal rate and regular rhythm.     Heart sounds: Normal heart sounds. No murmur heard. Pulmonary:     Effort: Pulmonary effort is normal. No respiratory distress.     Breath sounds: Normal breath sounds.  Abdominal:     General: Bowel sounds are normal. There is no distension.     Palpations: Abdomen is soft. There is no mass.     Tenderness: There is no abdominal tenderness. There is no guarding or rebound.  Musculoskeletal:        General: Normal range of motion.     Cervical back: Normal range of motion and neck supple.  Right lower leg: No edema.     Left lower leg: No edema.  Lymphadenopathy:     Cervical: No cervical adenopathy.  Skin:    General: Skin is warm and dry.     Findings: No erythema or rash.  Neurological:     Mental Status: She is alert and oriented to person, place, and time.     Cranial Nerves: No cranial nerve deficit.     Deep Tendon Reflexes: Reflexes are normal and symmetric.  Psychiatric:        Mood and Affect: Mood normal.        Behavior:  Behavior normal.        Thought Content: Thought content normal.        Judgment: Judgment normal.     BP 122/80 (BP Location: Right Arm, Patient Position: Sitting, Cuff Size: Large)   Pulse 82   Temp 98 F (36.7 C) (Oral)   Resp 18   Ht 5' 4 (1.626 m)   Wt 224 lb 9.6 oz (101.9 kg)   SpO2 99%   BMI 38.55 kg/m  Wt Readings from Last 3 Encounters:  05/27/24 224 lb 9.6 oz (101.9 kg)  05/12/24 223 lb (101.2 kg)  03/18/24 240 lb (108.9 kg)    Diabetic Foot Exam - Simple   No data filed    Lab Results  Component Value Date   WBC 6.3 03/24/2024   HGB 13.7 03/24/2024   HCT 41.0 03/24/2024   PLT 285.0 03/24/2024   GLUCOSE 85 03/24/2024   CHOL 133 03/24/2024   TRIG 62.0 03/24/2024   HDL 55.60 03/24/2024   LDLCALC 65 03/24/2024   ALT 20 03/24/2024   AST 23 03/24/2024   NA 142 03/24/2024   K 4.0 03/24/2024   CL 104 03/24/2024   CREATININE 0.79 03/24/2024   BUN 15 03/24/2024   CO2 29 03/24/2024   TSH 2.05 03/24/2024   HGBA1C 5.3 03/24/2024    Lab Results  Component Value Date   TSH 2.05 03/24/2024   Lab Results  Component Value Date   WBC 6.3 03/24/2024   HGB 13.7 03/24/2024   HCT 41.0 03/24/2024   MCV 94.5 03/24/2024   PLT 285.0 03/24/2024   Lab Results  Component Value Date   NA 142 03/24/2024   K 4.0 03/24/2024   CO2 29 03/24/2024   GLUCOSE 85 03/24/2024   BUN 15 03/24/2024   CREATININE 0.79 03/24/2024   BILITOT 1.0 03/24/2024   ALKPHOS 81 03/24/2024   AST 23 03/24/2024   ALT 20 03/24/2024   PROT 6.5 03/24/2024   ALBUMIN 4.3 03/24/2024   CALCIUM 9.5 03/24/2024   ANIONGAP 6 05/23/2021   GFR 78.61 03/24/2024   Lab Results  Component Value Date   CHOL 133 03/24/2024   Lab Results  Component Value Date   HDL 55.60 03/24/2024   Lab Results  Component Value Date   LDLCALC 65 03/24/2024   Lab Results  Component Value Date   TRIG 62.0 03/24/2024   Lab Results  Component Value Date   CHOLHDL 2 03/24/2024   Lab Results  Component Value  Date   HGBA1C 5.3 03/24/2024       Assessment & Plan:  Welcome to Medicare preventive visit -     EKG 12-Lead  Need for pneumococcal 20-valent conjugate vaccination -     Pneumococcal conjugate vaccine 20-valent  Estrogen deficiency -     DG Bone Density; Future   Assessment and Plan Assessment & Plan Adult  Wellness Visit   During her routine adult wellness visit, there were no significant changes in family history or new surgeries. She engages in regular exercise, including biking and walking. Her vision is 20/20 with glasses, memory is intact, and there are no signs of depression or hearing issues. She is eligible for the pneumonia vaccine. An EKG was performed, the pneumonia vaccine was administered, and an eye exam was scheduled. She is encouraged to continue regular exercise.  Immunization management   She was eligible for the pneumonia vaccine and received it. She [facility] and has no plans for COVID vaccinations.  Left shoulder rotator cuff tear (planned arthroscopic repair)   She is scheduled for arthroscopic repair of her left shoulder rotator cuff tear on December 1st. She has no anesthesia issues except for past nausea and vomiting. The potential for nausea and vomiting post-surgery and management options were discussed. The scheduled arthroscopic repair will proceed on December 1st.  Constipation   She experiences intermittent constipation, possibly related to Zetpan use. Management options were discussed, including magnesium, Dulcolax, Senokot, and Miralax . Magnesium, Dulcolax, or Senokot are recommended for daily use, with Miralax  as an alternative.   Julie-Ann Vanmaanen R Lowne Chase, DO

## 2024-05-27 NOTE — Progress Notes (Signed)
 Chief Complaint  Patient presents with   welcome to medicare     Subjective:   Audrey Duncan is a 63 y.o. female who presents for a Welcome to [facility] Exam.  Discussed the use of AI scribe software for clinical note transcription with the patient, who gave verbal consent to proceed.  History of Present Illness  Discussed the use of AI scribe software for clinical note transcription with the patient, who gave verbal consent to proceed.  History of Present Illness    Allergies (verified) Patient has no known allergies.   History: Past Medical History:  Diagnosis Date   Asthma    Chiari malformation type II (HCC)    diagnosed when in high school   Chiari malformation type II (HCC)    Colon polyp 2006   (TUBULAR ADENOMA)--COLONOSCOPY-DR. HAYES   Constipation    Fluid retention    H/O seasonal allergies    Medial meniscus tear    left   Over weight    Ulcerative colitis (HCC)    Past Surgical History:  Procedure Laterality Date   ABDOMINAL HYSTERECTOMY  1999   complete   CESAREAN SECTION  1995 and 1998   HERNIA REPAIR  2004   umbilical   Family History  Problem Relation Age of Onset   Heart disease Mother 52       2 MIs    Stroke Mother 27   Hypertension Mother    Cancer Mother        lung, lymphoma   Lung cancer Mother        was a smoker   Kidney disease Mother    Obesity Mother    COPD Father        smoker   Cancer Father        colon, lung prostate   Alcohol abuse Father    Hypertension Father    Obesity Father    Alzheimer's disease Father    Asthma Sister    Alcohol abuse Brother    Diabetes Paternal Uncle    Breast cancer Neg Hx    Social History   Occupational History    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    Comment: retired  Tobacco Use   Smoking status: Never   Smokeless tobacco: Never  Vaping Use   Vaping status: Never Used  Substance and Sexual Activity   Alcohol use: Yes    Comment: 1 glass of wine 1-2 x a month   Drug use:  No   Sexual activity: Yes    Partners: Male    Birth control/protection: Surgical   Tobacco Counseling Counseling given: Not Answered  SDOH Screenings   Food Insecurity: No Food Insecurity (05/12/2024)  Housing: Low Risk  (05/12/2024)  Transportation Needs: No Transportation Needs (05/12/2024)  Utilities: Not At Risk (05/12/2024)  Depression (PHQ2-9): Low Risk  (05/27/2024)  Physical Activity: Sufficiently Active (05/12/2024)  Social Connections: Moderately Integrated (05/12/2024)  Stress: Stress Concern Present (05/12/2024)  Tobacco Use: Low Risk  (05/27/2024)  Health Literacy: Adequate Health Literacy (05/12/2024)   See flowsheets for full screening details  Depression Screen PHQ 2 & 9 Depression Scale- Over the past 2 weeks, how often have you been bothered by any of the following problems? Little interest or pleasure in doing things: 0 Feeling down, depressed, or hopeless (PHQ Adolescent also includes...irritable): 0 PHQ-2 Total Score: 0 Trouble falling or staying asleep, or sleeping too much: 0 Feeling tired or having little energy: 0 Poor appetite or overeating (PHQ Adolescent also  includes...weight loss): 0 Feeling bad about yourself - or that you are a failure or have let yourself or your family down: 0 Trouble concentrating on things, such as reading the newspaper or watching television (PHQ Adolescent also includes...like school work): 0 Moving or speaking so slowly that other people could have noticed. Or the opposite - being so fidgety or restless that you have been moving around a lot more than usual: 0 Thoughts that you would be better off dead, or of hurting yourself in some way: 0 PHQ-9 Total Score: 0 If you checked off any problems, how difficult have these problems made it for you to do your work, take care of things at home, or get along with other people?: Not difficult at all      Goals Addressed   None    Visit info / Clinical Intake: Medicare Wellness Visit  Type:: Initial Annual Wellness Visit Medicare Wellness Visit Mode:: Telephone If telephone:: video declined Interpreter Needed?: No Pre-visit prep was completed: yes AWV questionnaire completed by patient prior to visit?: no Living arrangements:: (!) lives alone Patient's Overall Health Status Rating: very good Typical amount of pain: some (awaiting rotator cuff surgery on 06/09/24) Does pain affect daily life?: (!) yes Are you currently prescribed opioids?: no  Dietary Habits and Nutritional Risks How many meals a day?: 2 Eats fruit and vegetables daily?: yes Most meals are obtained by: preparing own meals Diabetic:: no  Functional Status Activities of Daily Living (to include ambulation/medication): Independent Ambulation: Independent Medication Administration: Independent Home Management: Independent Manage your own finances?: yes Primary transportation is: driving Concerns about vision?: no *vision screening is required for WTM* Concerns about hearing?: no  Fall Screening Falls in the past year?: 0 Number of falls in past year: 0 Was there an injury with Fall?: 0 Fall Risk Category Calculator: 0 Patient Fall Risk Level: Low Fall Risk  Fall Risk Patient at Risk for Falls Due to: No Fall Risks Fall risk Follow up: Falls evaluation completed  Home and Transportation Safety: All rugs have non-skid backing?: yes All stairs or steps have railings?: yes Grab bars in the bathtub or shower?: (!) no Have non-skid surface in bathtub or shower?: (!) no Good home lighting?: yes Regular seat belt use?: yes Hospital stays in the last year:: no  Cognitive Assessment Difficulty concentrating, remembering, or making decisions? : no Will 6CIT or Mini Cog be Completed: yes What year is it?: 0 points What month is it?: 0 points Give patient an address phrase to remember (5 components): [address] [place]  About what time is it?: 0 points Count backwards from 20 to 1: 0  points Say the months of the year in reverse: 0 points Repeat the address phrase from earlier: 0 points 6 CIT Score: 0 points  Advance Directives (For Healthcare) Does Patient Have a Medical Advance Directive?: Yes Does patient want to make changes to medical advance directive?: No - Patient declined Type of Advance Directive: Healthcare Power of [place]; Living will Copy of Healthcare Power of Attorney in Chart?: No - copy requested Copy of Living Will in Chart?: No - copy requested  Reviewed/Updated  Reviewed/Updated: All         Objective:    Today's Vitals   05/27/24 0849  BP: 122/80  Pulse: 82  Resp: 18  Temp: 98 F (36.7 C)  TempSrc: Oral  SpO2: 99%  Weight: 224 lb 9.6 oz (101.9 kg)  Height: 5' 4 (1.626 m)   Body mass  index is 38.55 kg/m.   Physical Exam Vitals and nursing note reviewed.  Constitutional:      General: She is not in acute distress.    Appearance: Normal appearance. She is well-developed.  HENT:     Head: Normocephalic and atraumatic.     Right Ear: Tympanic membrane, ear canal and external ear normal. There is no impacted cerumen.     Left Ear: Tympanic membrane, ear canal and external ear normal. There is no impacted cerumen.     Nose: Nose normal.     Mouth/Throat:     Mouth: Mucous membranes are moist.     Pharynx: Oropharynx is clear. No oropharyngeal exudate or posterior oropharyngeal erythema.  Eyes:     General: No scleral icterus.       Right eye: No discharge.        Left eye: No discharge.     Conjunctiva/sclera: Conjunctivae normal.     Pupils: Pupils are equal, round, and reactive to light.  Neck:     Thyroid : No thyromegaly or thyroid  tenderness.     Vascular: No JVD.  Cardiovascular:     Rate and Rhythm: Normal rate and regular rhythm.     Heart sounds: Normal heart sounds. No murmur heard. Pulmonary:     Effort: Pulmonary effort is normal. No respiratory distress.     Breath sounds: Normal breath sounds.  Abdominal:      General: Bowel sounds are normal. There is no distension.     Palpations: Abdomen is soft. There is no mass.     Tenderness: There is no abdominal tenderness. There is no guarding or rebound.  Musculoskeletal:        General: Normal range of motion.     Cervical back: Normal range of motion and neck supple.     Right lower leg: No edema.     Left lower leg: No edema.  Lymphadenopathy:     Cervical: No cervical adenopathy.  Skin:    General: Skin is warm and dry.     Findings: No erythema or rash.  Neurological:     Mental Status: She is alert and oriented to person, place, and time.     Cranial Nerves: No cranial nerve deficit.     Deep Tendon Reflexes: Reflexes are normal and symmetric.  Psychiatric:        Mood and Affect: Mood normal.        Behavior: Behavior normal.        Thought Content: Thought content normal.        Judgment: Judgment normal.   76   Current Medications (verified) Outpatient Encounter Medications as of 05/27/2024  Medication Sig   albuterol  (VENTOLIN  HFA) 108 (90 Base) MCG/ACT inhaler Inhale 1-2 puffs into the lungs every 6 (six) hours as needed for wheezing or shortness of breath.   buPROPion  (WELLBUTRIN  XL) 300 MG 24 hr tablet TAKE 1 TABLET BY MOUTH EVERY DAY   cyclobenzaprine  (FLEXERIL ) 10 MG tablet Take 1 tablet (10 mg total) by mouth at bedtime as needed for muscle spasms.   hydrochlorothiazide  (HYDRODIURIL ) 25 MG tablet Take 1 tablet (25 mg total) by mouth daily.   ibuprofen (ADVIL) 200 MG tablet Take 200 mg by mouth every 6 (six) hours as needed.   KLOR-CON  M20 20 MEQ tablet TAKE 1 TABLET BY MOUTH EVERY DAY   loratadine (CLARITIN) 10 MG tablet Take 10 mg by mouth daily.   Magnesium 250 MG TABS Take 1 tablet by mouth daily.  meloxicam  (MOBIC ) 15 MG tablet Take 0.5-1 tablets (7.5-15 mg total) by mouth daily as needed for pain.   polyethylene glycol powder (GLYCOLAX /MIRALAX ) 17 GM/SCOOP powder Take 1 Container by mouth once.   polyethylene  glycol powder (GLYCOLAX /MIRALAX ) powder Take 17 g by mouth daily. (Patient taking differently: Take 17 g by mouth daily as needed.)   Probiotic Product (ALIGN PO) Take 1 capsule by mouth daily.   silver  sulfADIAZINE  (SILVADENE ) 1 % cream Apply 1 Application topically daily.   vitamin B-12 (CYANOCOBALAMIN ) 250 MCG tablet Take 250 mcg by mouth daily.   Vitamin D , Ergocalciferol , (DRISDOL ) 1.25 MG (50000 UNIT) CAPS capsule TAKE 1 CAPSULE (50,000 UNITS TOTAL) BY MOUTH EVERY 7 (SEVEN) DAYS   ZEPBOUND  5 MG/0.5ML injection vial INJECT 0.5 ML (5 MG) UNDER THE SKIN ONCE WEEKLY (0.5ML= 50 UNITS)   Zinc 100 MG TABS Take by mouth.   No facility-administered encounter medications on file as of 05/27/2024.   Hearing/Vision screen Vision Screening   Right eye Left eye Both eyes  Without correction     With correction 20/20 20/20 20/20   Hearing Screening - Comments:: Normal whisper test Immunizations and Health Maintenance Health Maintenance  Topic Date Due   DEXA SCAN  11/09/2024 (Originally 01/03/2024)   COVID-19 Vaccine (3 - 2025-26 season) 05/12/2025 (Originally 03/10/2024)   HIV Screening  05/12/2025 (Originally 01/02/1976)   Zoster Vaccines- Shingrix (1 of 2) 05/12/2025 (Originally 01/02/2009)   Medicare Annual Wellness (AWV)  05/12/2025   Mammogram  08/02/2025   Colonoscopy  04/21/2026   DTaP/Tdap/Td (3 - Td or Tdap) 11/30/2030   Pneumococcal Vaccine: 50+ Years  Completed   Influenza Vaccine  Completed   Hepatitis C Screening  Completed   Hepatitis B Vaccines 19-59 Average Risk  Aged Out   Meningococcal B Vaccine  Aged Out    EKG: normal EKG, normal sinus rhythm, unchanged from previous tracings     Assessment/Plan:  This is a routine wellness examination for [place].  Patient Care Team: Antonio Meth, Jamee SAUNDERS, DO as PCP - General (Family Medicine) Sharan, Kate LABOR, RN (Inactive) as [facility] Care Management [place], Tawni CROME, MD as Referring Physician  (Dermatology) P.A., [facility] [place], [place], [place]  I have personally reviewed and noted the following in the patient's chart:   Medical and social history Use of alcohol, tobacco or illicit drugs  Current medications and supplements including opioid prescriptions. Functional ability and status Nutritional status Physical activity Advanced directives List of other physicians Hospitalizations, surgeries, and ER visits in previous 12 months Vitals Screenings to include cognitive, depression, and falls Referrals and appointments  Orders Placed This Encounter  Procedures   DG Bone Density    Standing Status:   Future    Expiration Date:   05/27/2025    Reason for Exam (SYMPTOM  OR DIAGNOSIS REQUIRED):   estrogen def    Preferred imaging location?:   [facility]   Pneumococcal conjugate vaccine 20-valent (Prevnar 20)   EKG 12-Lead   In addition, I have reviewed and discussed with patient certain preventive protocols, quality metrics, and best practice recommendations. A written personalized care plan for preventive services as well as general preventive health recommendations were provided to patient.   Marelyn Rouser R Lowne Chase, DO   05/27/2024   Return in about 6 months (around 11/24/2024), or if symptoms worsen or fail to improve.

## 2024-06-09 DIAGNOSIS — M25812 Other specified joint disorders, left shoulder: Secondary | ICD-10-CM | POA: Diagnosis not present

## 2024-06-09 DIAGNOSIS — M7542 Impingement syndrome of left shoulder: Secondary | ICD-10-CM | POA: Diagnosis not present

## 2024-06-09 DIAGNOSIS — M7522 Bicipital tendinitis, left shoulder: Secondary | ICD-10-CM | POA: Diagnosis not present

## 2024-06-09 DIAGNOSIS — S43492A Other sprain of left shoulder joint, initial encounter: Secondary | ICD-10-CM | POA: Diagnosis not present

## 2024-06-09 DIAGNOSIS — X58XXXA Exposure to other specified factors, initial encounter: Secondary | ICD-10-CM | POA: Diagnosis not present

## 2024-06-09 DIAGNOSIS — M24112 Other articular cartilage disorders, left shoulder: Secondary | ICD-10-CM | POA: Diagnosis not present

## 2024-06-09 DIAGNOSIS — M75112 Incomplete rotator cuff tear or rupture of left shoulder, not specified as traumatic: Secondary | ICD-10-CM | POA: Diagnosis not present

## 2024-06-09 DIAGNOSIS — Y999 Unspecified external cause status: Secondary | ICD-10-CM | POA: Diagnosis not present

## 2024-06-09 DIAGNOSIS — M75122 Complete rotator cuff tear or rupture of left shoulder, not specified as traumatic: Secondary | ICD-10-CM | POA: Diagnosis not present

## 2024-06-09 DIAGNOSIS — M7502 Adhesive capsulitis of left shoulder: Secondary | ICD-10-CM | POA: Diagnosis not present

## 2024-06-09 DIAGNOSIS — M7552 Bursitis of left shoulder: Secondary | ICD-10-CM | POA: Diagnosis not present

## 2024-06-09 DIAGNOSIS — S43432A Superior glenoid labrum lesion of left shoulder, initial encounter: Secondary | ICD-10-CM | POA: Diagnosis not present

## 2024-06-09 DIAGNOSIS — M7582 Other shoulder lesions, left shoulder: Secondary | ICD-10-CM | POA: Diagnosis not present

## 2024-06-09 DIAGNOSIS — M19012 Primary osteoarthritis, left shoulder: Secondary | ICD-10-CM | POA: Diagnosis not present

## 2024-06-11 DIAGNOSIS — Z9889 Other specified postprocedural states: Secondary | ICD-10-CM | POA: Diagnosis not present

## 2024-06-11 DIAGNOSIS — M6281 Muscle weakness (generalized): Secondary | ICD-10-CM | POA: Diagnosis not present

## 2024-06-11 DIAGNOSIS — M25512 Pain in left shoulder: Secondary | ICD-10-CM | POA: Diagnosis not present

## 2024-06-12 DIAGNOSIS — M6281 Muscle weakness (generalized): Secondary | ICD-10-CM | POA: Diagnosis not present

## 2024-06-12 DIAGNOSIS — Z9889 Other specified postprocedural states: Secondary | ICD-10-CM | POA: Diagnosis not present

## 2024-06-12 DIAGNOSIS — M25512 Pain in left shoulder: Secondary | ICD-10-CM | POA: Diagnosis not present

## 2024-06-17 DIAGNOSIS — Z9889 Other specified postprocedural states: Secondary | ICD-10-CM | POA: Diagnosis not present

## 2024-07-08 ENCOUNTER — Other Ambulatory Visit: Payer: Self-pay | Admitting: Family Medicine

## 2024-07-08 DIAGNOSIS — I10 Essential (primary) hypertension: Secondary | ICD-10-CM

## 2024-07-21 ENCOUNTER — Ambulatory Visit (HOSPITAL_BASED_OUTPATIENT_CLINIC_OR_DEPARTMENT_OTHER)
Admission: RE | Admit: 2024-07-21 | Discharge: 2024-07-21 | Disposition: A | Discharge status: Home / Self Care | Source: Ambulatory Visit | Attending: Family Medicine | Admitting: Family Medicine

## 2024-07-21 ENCOUNTER — Ambulatory Visit: Payer: Self-pay | Admitting: Family Medicine

## 2024-07-21 DIAGNOSIS — E2839 Other primary ovarian failure: Secondary | ICD-10-CM | POA: Diagnosis present

## 2024-08-04 ENCOUNTER — Ambulatory Visit

## 2024-09-16 ENCOUNTER — Ambulatory Visit

## 2025-05-13 ENCOUNTER — Ambulatory Visit
